# Patient Record
Sex: Female | Born: 1965 | Race: White | Hispanic: No | Smoking: Never smoker
Health system: Southern US, Community
[De-identification: ages and names within clinical notes are randomized; demographics above are authoritative.]

## PROBLEM LIST (undated history)

## (undated) DIAGNOSIS — Z0000 Encounter for general adult medical examination without abnormal findings: Secondary | ICD-10-CM

## (undated) DIAGNOSIS — R51 Headache: Secondary | ICD-10-CM

## (undated) DIAGNOSIS — R32 Unspecified urinary incontinence: Secondary | ICD-10-CM

## (undated) DIAGNOSIS — K219 Gastro-esophageal reflux disease without esophagitis: Secondary | ICD-10-CM

## (undated) DIAGNOSIS — R011 Cardiac murmur, unspecified: Secondary | ICD-10-CM

## (undated) DIAGNOSIS — E785 Hyperlipidemia, unspecified: Secondary | ICD-10-CM

## (undated) DIAGNOSIS — N920 Excessive and frequent menstruation with regular cycle: Secondary | ICD-10-CM

## (undated) DIAGNOSIS — F32A Depression, unspecified: Secondary | ICD-10-CM

## (undated) DIAGNOSIS — Q613 Polycystic kidney, unspecified: Secondary | ICD-10-CM

## (undated) DIAGNOSIS — I82491 Acute embolism and thrombosis of other specified deep vein of right lower extremity: Secondary | ICD-10-CM

## (undated) DIAGNOSIS — F418 Other specified anxiety disorders: Secondary | ICD-10-CM

## (undated) DIAGNOSIS — Q6102 Congenital multiple renal cysts: Secondary | ICD-10-CM

## (undated) DIAGNOSIS — I10 Essential (primary) hypertension: Secondary | ICD-10-CM

## (undated) DIAGNOSIS — Z8619 Personal history of other infectious and parasitic diseases: Secondary | ICD-10-CM

## (undated) DIAGNOSIS — E669 Obesity, unspecified: Secondary | ICD-10-CM

## (undated) HISTORY — DX: Congenital multiple renal cysts: Q61.02

## (undated) HISTORY — DX: Headache: R51

## (undated) HISTORY — DX: Gastro-esophageal reflux disease without esophagitis: K21.9

## (undated) HISTORY — PX: TONSILLECTOMY: SUR1361

## (undated) HISTORY — DX: Acute embolism and thrombosis of other specified deep vein of right lower extremity: I82.491

## (undated) HISTORY — DX: Encounter for general adult medical examination without abnormal findings: Z00.00

## (undated) HISTORY — DX: Polycystic kidney, unspecified: Q61.3

## (undated) HISTORY — DX: Other specified anxiety disorders: F41.8

## (undated) HISTORY — DX: Excessive and frequent menstruation with regular cycle: N92.0

## (undated) HISTORY — DX: Cardiac murmur, unspecified: R01.1

## (undated) HISTORY — DX: Unspecified urinary incontinence: R32

## (undated) HISTORY — DX: Obesity, unspecified: E66.9

## (undated) HISTORY — DX: Personal history of other infectious and parasitic diseases: Z86.19

## (undated) HISTORY — DX: Essential (primary) hypertension: I10

## (undated) HISTORY — DX: Depression, unspecified: F32.A

## (undated) HISTORY — DX: Hyperlipidemia, unspecified: E78.5

---
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
## 2000-06-21 HISTORY — PX: KIDNEY CYST REMOVAL: SHX684

## 2000-06-21 HISTORY — PX: URETERAL STENT PLACEMENT: SHX822

## 2008-06-21 HISTORY — PX: ENDOMETRIAL BIOPSY: SHX622

## 2008-10-30 ENCOUNTER — Ambulatory Visit (HOSPITAL_BASED_OUTPATIENT_CLINIC_OR_DEPARTMENT_OTHER): Admission: RE | Admit: 2008-10-30 | Discharge: 2008-10-30 | Payer: Self-pay | Admitting: Internal Medicine

## 2008-10-30 ENCOUNTER — Ambulatory Visit: Payer: Self-pay | Admitting: Diagnostic Radiology

## 2010-07-06 ENCOUNTER — Ambulatory Visit
Admission: RE | Admit: 2010-07-06 | Discharge: 2010-07-06 | Payer: Self-pay | Source: Home / Self Care | Attending: Internal Medicine | Admitting: Internal Medicine

## 2010-09-10 ENCOUNTER — Other Ambulatory Visit: Payer: Self-pay

## 2010-09-10 ENCOUNTER — Ambulatory Visit (INDEPENDENT_AMBULATORY_CARE_PROVIDER_SITE_OTHER): Payer: BC Managed Care – PPO | Admitting: Internal Medicine

## 2010-09-10 DIAGNOSIS — L851 Acquired keratosis [keratoderma] palmaris et plantaris: Secondary | ICD-10-CM

## 2010-09-10 DIAGNOSIS — Z01419 Encounter for gynecological examination (general) (routine) without abnormal findings: Secondary | ICD-10-CM

## 2010-12-03 ENCOUNTER — Ambulatory Visit (INDEPENDENT_AMBULATORY_CARE_PROVIDER_SITE_OTHER): Payer: BC Managed Care – PPO | Admitting: Internal Medicine

## 2010-12-03 DIAGNOSIS — I10 Essential (primary) hypertension: Secondary | ICD-10-CM

## 2010-12-03 DIAGNOSIS — G43109 Migraine with aura, not intractable, without status migrainosus: Secondary | ICD-10-CM

## 2010-12-03 DIAGNOSIS — M79609 Pain in unspecified limb: Secondary | ICD-10-CM

## 2010-12-09 ENCOUNTER — Encounter: Payer: Self-pay | Admitting: Internal Medicine

## 2010-12-10 ENCOUNTER — Ambulatory Visit: Payer: Self-pay | Admitting: Internal Medicine

## 2011-03-18 ENCOUNTER — Ambulatory Visit (INDEPENDENT_AMBULATORY_CARE_PROVIDER_SITE_OTHER): Payer: BC Managed Care – PPO | Admitting: Emergency Medicine

## 2011-03-18 DIAGNOSIS — Z23 Encounter for immunization: Secondary | ICD-10-CM

## 2011-03-18 NOTE — Progress Notes (Signed)
  Subjective:    Patient ID: Kim Fox, female    DOB: 1965-07-26, 45 y.o.   MRN: [id number]  HPI    Review of Systems     Objective:   Physical Exam        Assessment & Plan:

## 2011-04-07 ENCOUNTER — Telehealth: Payer: Self-pay | Admitting: Emergency Medicine

## 2011-04-07 NOTE — Telephone Encounter (Signed)
Kim Fox came in with dtr this afternoon.  She has cold sore coming up on lower lip.  She would like medication called into pharmacy if possible.  Aware I would speak to DDS and send in if appropriate

## 2011-04-07 NOTE — Telephone Encounter (Signed)
LMOVM for Kim Fox with DDS recommendations

## 2011-04-07 NOTE — Telephone Encounter (Signed)
Ok to get OTC Abreva for cold sore

## 2011-05-26 ENCOUNTER — Other Ambulatory Visit: Payer: Self-pay | Admitting: Emergency Medicine

## 2011-05-26 DIAGNOSIS — IMO0001 Reserved for inherently not codable concepts without codable children: Secondary | ICD-10-CM

## 2011-05-26 NOTE — Telephone Encounter (Signed)
Refill request for pt's generic Cryselle

## 2011-05-27 MED ORDER — NORGESTREL-ETHINYL ESTRADIOL 0.3-30 MG-MCG PO TABS: 1.0000 | ORAL_TABLET | Freq: Every day | ORAL | Status: DC

## 2011-06-10 ENCOUNTER — Telehealth: Payer: Self-pay | Admitting: Emergency Medicine

## 2011-06-10 NOTE — Telephone Encounter (Signed)
Spoke with Kim Fox.  She states she feels like she has a sinus infection, would like medication called in before holiday.  She states she has significant sinus pressure and pain, her teeth hurt, headache.  She denies any fever.  She is not able to loosen much of the congestion to blow any out of her nose.  Advised I can not call in medication without being seen and DDS is out of the office until next week.  I advised her to go to urgent care today for potential abx therapy.  She is agreeable- will go to urgent care near her home today

## 2011-07-26 ENCOUNTER — Ambulatory Visit (INDEPENDENT_AMBULATORY_CARE_PROVIDER_SITE_OTHER): Payer: BC Managed Care – PPO | Admitting: Internal Medicine

## 2011-07-26 ENCOUNTER — Encounter: Payer: Self-pay | Admitting: Internal Medicine

## 2011-07-26 VITALS — BP 151/90 | HR 70 | Temp 97.1°F | Resp 12 | Ht 66.5 in | Wt 206.0 lb

## 2011-07-26 DIAGNOSIS — J329 Chronic sinusitis, unspecified: Secondary | ICD-10-CM

## 2011-07-26 MED ORDER — AZITHROMYCIN 250 MG PO TABS: ORAL_TABLET | ORAL | Status: AC

## 2011-07-26 NOTE — Patient Instructions (Signed)
Take meds as presribed call if not better

## 2011-07-26 NOTE — Progress Notes (Signed)
Subjective:    Patient ID: Kim Fox, female    DOB: Feb 18, 1966, 46 y.o.   MRN: [id number]  HPI  Here for acute visit.  Sinus pain congestion both cheeks one week.  No fever she had influenza vaccine  Allergies  Allergen Reactions  . Macrodantin Rash  . Penicillins Rash   Past Medical History  Diagnosis Date  . Hypertension   . Headache     migraine  . GERD (gastroesophageal reflux disease)   . Polycystic kidney disease   . Obesity   . Menorrhagia   . Murmur, cardiac     Echo WNL 2009   Past Surgical History  Procedure Date  . Tonsillectomy   . Ureteral stent placement 2002  . Kidney cyst removal 2002  . Endometrial biopsy 2010   History   Social History  . Marital Status: Divorced    Spouse Name: N/A    Number of Children: 2  . 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 Smoking status: Never Smoker   . Smokeless tobacco: Not on file  . Alcohol Use: 0.5 oz/week    1 drink(s) per week  . Drug Use: No  . Sexually Active: Not Currently   Other Topics Concern  . Not on file   Social History Narrative  . No narrative on file   Family History  Problem Relation Age of Onset  . Diabetes Mother   . Hypertension Mother   . Diabetes Father   . Hypertension Father   . Migraines Mother   . Migraines Sister   . Cancer Paternal Grandmother     lung  . Cancer Paternal Grandfather     bladder, prostate  . Heart disease Paternal Grandfather    There is no problem list on file for this patient.  Current Outpatient Prescriptions on File Prior to Visit  Medication Sig Dispense Refill  . norgestrel-ethinyl estradiol (LO/OVRAL,CRYSELLE) 0.3-30 MG-MCG tablet Take 1 tablet by mouth daily.  1 Package  11  . SUMAtriptan (IMITREX) 100 MG tablet Take 100 mg by mouth. Take at the onset of HA.  May repeat in 2 hours if needed.  Max dose 2/24 hours, 4/week           Review of  Systems See HPI    Objective:   Physical Exam Physical Exam  Constitutional: She is oriented to person, place, and time. She appears well-developed and well-nourished. She is cooperative.  HENT:  Head: Normocephalic and atraumatic.  Right Ear: A middle ear effusion is present.  Left Ear: A middle ear effusion is present.  Nose: Mucosal edema present. Right sinus exhibits maxillary sinus tenderness. Left sinus exhibits maxillary sinus tenderness.  Mouth/Throat: Posterior oropharyngeal erythema present.  Serous effusion bilaterally  Eyes: Conjunctivae and EOM are normal. Pupils are equal, round, and reactive to light.  Neck: Neck supple. Carotid bruit is not present. No mass present.  Cardiovascular: Regular rhythm, normal heart sounds, intact distal pulses and normal pulses. Exam reveals no gallop and no friction rub.  No murmur heard.  Pulmonary/Chest: Breath sounds normal. She has no wheezes. She has no rhonchi. She has no rales.  Neurological: She is alert and oriented to person, place, and time.  Skin: Skin is warm and dry. No abrasion, no bruising, no ecchymosis and no rash noted. No cyanosis. Nails show no clubbing.  Psychiatric: She has a normal mood and affect. Her speech  is normal and behavior is normal.            Assessment & Plan:  1)  Sinusitis  Z pak and pt states she has OTC /sudafed 2)  Cough  OTC cough med of choice  Call if not better

## 2011-08-09 ENCOUNTER — Ambulatory Visit (INDEPENDENT_AMBULATORY_CARE_PROVIDER_SITE_OTHER): Payer: BC Managed Care – PPO | Admitting: Internal Medicine

## 2011-08-09 ENCOUNTER — Encounter: Payer: Self-pay | Admitting: Internal Medicine

## 2011-08-09 DIAGNOSIS — Z23 Encounter for immunization: Secondary | ICD-10-CM

## 2011-08-09 DIAGNOSIS — E669 Obesity, unspecified: Secondary | ICD-10-CM

## 2011-08-09 DIAGNOSIS — I10 Essential (primary) hypertension: Secondary | ICD-10-CM

## 2011-08-09 DIAGNOSIS — Q6102 Congenital multiple renal cysts: Secondary | ICD-10-CM | POA: Insufficient documentation

## 2011-08-09 DIAGNOSIS — Q613 Polycystic kidney, unspecified: Secondary | ICD-10-CM

## 2011-08-09 DIAGNOSIS — R928 Other abnormal and inconclusive findings on diagnostic imaging of breast: Secondary | ICD-10-CM | POA: Insufficient documentation

## 2011-08-09 DIAGNOSIS — G43909 Migraine, unspecified, not intractable, without status migrainosus: Secondary | ICD-10-CM

## 2011-08-09 DIAGNOSIS — Z8679 Personal history of other diseases of the circulatory system: Secondary | ICD-10-CM

## 2011-08-09 DIAGNOSIS — Z0000 Encounter for general adult medical examination without abnormal findings: Secondary | ICD-10-CM

## 2011-08-09 DIAGNOSIS — K219 Gastro-esophageal reflux disease without esophagitis: Secondary | ICD-10-CM

## 2011-08-09 HISTORY — DX: Congenital multiple renal cysts: Q61.02

## 2011-08-09 LAB — POCT URINALYSIS DIPSTICK
Bilirubin, UA: NEGATIVE
Blood, UA: NEGATIVE
Glucose, UA: NEGATIVE
Nitrite, UA: NEGATIVE
Spec Grav, UA: 1.02

## 2011-08-09 MED ORDER — ATENOLOL 50 MG PO TABS: 50.0000 mg | ORAL_TABLET | Freq: Every day | ORAL | Status: DC

## 2011-08-09 NOTE — Patient Instructions (Signed)
Take BP med daily  See me in 2 months

## 2011-08-09 NOTE — Progress Notes (Signed)
Subjective:    Patient ID: Kim Fox, female    DOB: 1965/12/30, 46 y.o.   MRN: [id number]  HPI  Kim Fox is here for comprehensive eval.  She is doing well  SEe BP.  We had stopped her Atenolol as she was losing weight .  Strong GH of htn in both parents.  She sometimes feels a "pulsation feeling"  No headache no chest pain no sob no LE edema  She continues to do well with weight watchers  She had tetanus in 2002  Allergies  Allergen Reactions  . Macrodantin Rash  . Penicillins Rash   Past Medical History  Diagnosis Date  . Hypertension   . Headache     migraine  . GERD (gastroesophageal reflux disease)   . Polycystic kidney disease   . Obesity   . Menorrhagia   . Murmur, cardiac     Echo WNL 2009   Past Surgical History  Procedure Date  . Tonsillectomy   . Ureteral stent placement 2002  . Kidney cyst removal 2002  . Endometrial biopsy 2010   History   Social History  . Marital Status: Divorced    Spouse Name: N/A    Number of Children: 2  . 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 Smoking status: Never Smoker   . Smokeless tobacco: Not on file  . Alcohol Use: 0.5 oz/week    1 drink(s) per week  . Drug Use: No  . Sexually Active: Not Currently   Other Topics Concern  . Not on file   Social History Narrative  . No narrative on file   Family History  Problem Relation Age of Onset  . Diabetes Mother   . Hypertension Mother   . Diabetes Father   . Hypertension Father   . Migraines Mother   . Migraines Sister   . Cancer Paternal Grandmother     lung  . Cancer Paternal Grandfather     bladder, prostate  . Heart disease Paternal Grandfather    Patient Active Problem List  Diagnoses  . Essential hypertension, benign  . GERD (gastroesophageal reflux disease)  . Migraine  . Polycystic kidney disease  . Obesity  . H/O cardiac murmur  . Abnormal mammogram     Current Outpatient Prescriptions on File Prior to Visit  Medication Sig Dispense Refill  . norgestrel-ethinyl estradiol (LO/OVRAL,CRYSELLE) 0.3-30 MG-MCG tablet Take 1 tablet by mouth daily.  1 Package  11  . SUMAtriptan (IMITREX) 100 MG tablet Take 100 mg by mouth. Take at the onset of HA.  May repeat in 2 hours if needed.  Max dose 2/24 hours, 4/week             Review of Systems  Constitutional: Negative for fever, fatigue and unexpected weight change.  HENT: Negative for hearing loss, ear pain, nosebleeds and neck pain.   Eyes: Negative for pain, redness and visual disturbance.  Respiratory: Negative for cough, chest tightness, shortness of breath and wheezing.   Cardiovascular: Negative for chest pain, palpitations and leg swelling.  Gastrointestinal: Negative for abdominal pain and blood in stool.  Genitourinary: Negative for dysuria, difficulty urinating and pelvic pain.  Musculoskeletal: Negative for myalgias and joint swelling.  Neurological: Negative for tremors and syncope.       Objective:   Physical Exam Physical Exam  Nursing note and vitals reviewed.  Constitutional: She is oriented to person, place, and  time. She appears well-developed and well-nourished.  HENT:  Head: Normocephalic and atraumatic.  Right Ear: Tympanic membrane and ear canal normal. No drainage. Tympanic membrane is not injected and not erythematous.  Left Ear: Tympanic membrane and ear canal normal. No drainage. Tympanic membrane is not injected and not erythematous.  Nose: Nose normal. Right sinus exhibits no maxillary sinus tenderness and no frontal sinus tenderness. Left sinus exhibits no maxillary sinus tenderness and no frontal sinus tenderness.  Mouth/Throat: Oropharynx is clear and moist. No oral lesions. No oropharyngeal exudate.  Eyes: Conjunctivae and EOM are normal. Pupils are equal, round, and reactive to light.  Neck: Normal range of motion. Neck supple. No JVD present. Carotid  bruit is not present. No mass and no thyromegaly present.  Cardiovascular: Normal rate, regular rhythm, S1 normal, S2 normal and intact distal pulses. Exam reveals no gallop and no friction rub.  No murmur heard.  Pulses:  Carotid pulses are 2+ on the right side, and 2+ on the left side.  Dorsalis pedis pulses are 2+ on the right side, and 2+ on the left side.  No carotid bruit. No LE edema  Pulmonary/Chest: Breath sounds normal. She has no wheezes. She has no rales. She exhibits no tenderness.  Breasts:  No discrete mass no nipple discharge no axiallary adenopathy bilaterally  Abdominal: Soft. Bowel sounds are normal. She exhibits no distension and no mass. There is no hepatosplenomegaly. There is no tenderness. There is no CVA tenderness.  Musculoskeletal: Normal range of motion.  No active synovitis to joints.  Lymphadenopathy:  She has no cervical adenopathy.  She has no axillary adenopathy.  Right: No inguinal and no supraclavicular adenopathy present.  Left: No inguinal and no supraclavicular adenopathy present.  Neurological: She is alert and oriented to person, place, and time. She has normal strength and normal reflexes. She displays no tremor. No cranial nerve deficit or sensory deficit. Coordination and gait normal.  Skin: Skin is warm and dry. No rash noted. No cyanosis. Nails show no clubbing.  Psychiatric: She has a normal mood and affect. Her speech is normal and behavior is normal. Cognition and memory are normal.       Assessment & Plan:  1)  Health Maintenance: See scanned sheet Tdap today. Counseled to get mmammmogram in April 2)  HTN  Will need to re-intitate Atenolol   Return 2 months 3)  Obesity  Doing very well with weight watchers  4)  Migraine   5)  GERD  inmproving with weight loss 6) Polycystic kidney disease  Return 2 months

## 2011-08-10 LAB — COMPREHENSIVE METABOLIC PANEL
ALT: 14 U/L (ref 0–35)
BUN: 20 mg/dL (ref 6–23)
CO2: 26 mEq/L (ref 19–32)
Calcium: 9 mg/dL (ref 8.4–10.5)
Creat: 0.68 mg/dL (ref 0.50–1.10)
Total Bilirubin: 0.4 mg/dL (ref 0.3–1.2)

## 2011-08-10 LAB — CBC WITH DIFFERENTIAL/PLATELET
Eosinophils Absolute: 0.1 10*3/uL (ref 0.0–0.7)
Eosinophils Relative: 1 % (ref 0–5)
HCT: 44.7 % (ref 36.0–46.0)
Hemoglobin: 14.6 g/dL (ref 12.0–15.0)
Lymphocytes Relative: 34 % (ref 12–46)
Lymphs Abs: 2.6 10*3/uL (ref 0.7–4.0)
MCH: 29.6 pg (ref 26.0–34.0)
MCV: 90.5 fL (ref 78.0–100.0)
Monocytes Relative: 7 % (ref 3–12)
RBC: 4.94 MIL/uL (ref 3.87–5.11)

## 2011-08-10 LAB — LIPID PANEL
Cholesterol: 149 mg/dL (ref 0–200)
HDL: 47 mg/dL (ref >39)
LDL Cholesterol: 85 mg/dL (ref 0–99)
Triglycerides: 84 mg/dL (ref <150)

## 2011-08-10 LAB — TSH: TSH: 1.577 u[IU]/mL (ref 0.350–4.500)

## 2011-08-12 ENCOUNTER — Telehealth: Payer: Self-pay | Admitting: Emergency Medicine

## 2011-08-12 NOTE — Telephone Encounter (Signed)
Lab results mailed to Cathy's home address with notes from DDS

## 2011-09-13 ENCOUNTER — Encounter: Payer: BC Managed Care – PPO | Admitting: Internal Medicine

## 2011-10-04 ENCOUNTER — Other Ambulatory Visit: Payer: Self-pay | Admitting: Internal Medicine

## 2011-10-04 DIAGNOSIS — Z1231 Encounter for screening mammogram for malignant neoplasm of breast: Secondary | ICD-10-CM

## 2011-10-06 ENCOUNTER — Ambulatory Visit (HOSPITAL_BASED_OUTPATIENT_CLINIC_OR_DEPARTMENT_OTHER)
Admission: RE | Admit: 2011-10-06 | Discharge: 2011-10-06 | Disposition: A | Discharge status: Home / Self Care | Payer: BC Managed Care – PPO | Source: Ambulatory Visit | Attending: Internal Medicine | Admitting: Internal Medicine

## 2011-10-06 DIAGNOSIS — Z1231 Encounter for screening mammogram for malignant neoplasm of breast: Secondary | ICD-10-CM | POA: Insufficient documentation

## 2011-11-03 ENCOUNTER — Ambulatory Visit (INDEPENDENT_AMBULATORY_CARE_PROVIDER_SITE_OTHER): Payer: BC Managed Care – PPO | Admitting: Internal Medicine

## 2011-11-03 ENCOUNTER — Encounter: Payer: Self-pay | Admitting: Internal Medicine

## 2011-11-03 VITALS — BP 120/70 | HR 52 | Temp 96.8°F | Resp 16 | Ht 68.0 in | Wt 209.0 lb

## 2011-11-03 DIAGNOSIS — E669 Obesity, unspecified: Secondary | ICD-10-CM

## 2011-11-03 DIAGNOSIS — I10 Essential (primary) hypertension: Secondary | ICD-10-CM

## 2011-11-03 DIAGNOSIS — K219 Gastro-esophageal reflux disease without esophagitis: Secondary | ICD-10-CM

## 2011-11-03 NOTE — Progress Notes (Signed)
Subjective:    Patient ID: Kim Fox, female    DOB: June 16, 1966, 46 y.o.   MRN: [id number]  HPI Kim Fox is here after re-initiating low dose beta blocker for BP.  Feeling well no side effects from med.  No chest pain no SOB, no LE edema   Allergies  Allergen Reactions  . Macrodantin Rash  . Penicillins Rash   Past Medical History  Diagnosis Date  . Hypertension   . Headache     migraine  . GERD (gastroesophageal reflux disease)   . Polycystic kidney disease   . Obesity   . Menorrhagia   . Murmur, cardiac     Echo WNL 2009   Past Surgical History  Procedure Date  . Tonsillectomy   . Ureteral stent placement 2002  . Kidney cyst removal 2002  . Endometrial biopsy 2010   History   Social History  . Marital Status: Divorced    Spouse Name: N/A    Number of Children: 2  . 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 Smoking status: Never Smoker   . Smokeless tobacco: Not on file  . Alcohol Use: 0.5 oz/week    1 drink(s) per week  . Drug Use: No  . Sexually Active: Not Currently   Other Topics Concern  . Not on file   Social History Narrative  . No narrative on file   Family History  Problem Relation Age of Onset  . Diabetes Mother   . Hypertension Mother   . Diabetes Father   . Hypertension Father   . Migraines Mother   . Migraines Sister   . Cancer Paternal Grandmother     lung  . Cancer Paternal Grandfather     bladder, prostate  . Heart disease Paternal Grandfather    Patient Active Problem List  Diagnoses  . Essential hypertension, benign  . GERD (gastroesophageal reflux disease)  . Migraine  . Polycystic kidney disease  . Obesity  . H/O cardiac murmur  . Abnormal mammogram   Current Outpatient Prescriptions on File Prior to Visit  Medication Sig Dispense Refill  . atenolol (TENORMIN) 50 MG tablet Take 1 tablet (50 mg total) by mouth daily.  90  tablet  3  . Biotin 5000 MCG CAPS Take 1 capsule by mouth daily.      . Calcium Carbonate-Vitamin D (CALCIUM-VITAMIN D) 600-200 MG-UNIT CAPS Take 1 tablet by mouth 2 (two) times daily.      . Multiple Vitamin (MULTIVITAMIN) tablet Take 1 tablet by mouth daily.      . norgestrel-ethinyl estradiol (LO/OVRAL,CRYSELLE) 0.3-30 MG-MCG tablet Take 1 tablet by mouth daily.  1 Package  11  . SUMAtriptan (IMITREX) 100 MG tablet Take 100 mg by mouth. Take at the onset of HA.  May repeat in 2 hours if needed.  Max dose 2/24 hours, 4/week       . L-Lysine 1000 MG TABS Take 1 tablet by mouth daily.          Review of Systems    see HPI Objective:   Physical Exam  Physical Exam  Nursing note and vitals reviewed.  Constitutional: She is oriented to person, place, and time. She appears well-developed and well-nourished.  HENT:  Head: Normocephalic and atraumatic.  Cardiovascular: Normal rate and regular rhythm. Exam reveals no gallop and no friction rub.  No murmur heard.  Pulmonary/Chest: Breath sounds normal. She has no  wheezes. She has no rales.  Neurological: She is alert and oriented to person, place, and time.  Skin: Skin is warm and dry.  Psychiatric: She has a normal mood and affect. Her behavior is normal.  Ext no edema        Assessment & Plan:  1)  HTN  Well controlled continue atenolol 2)  Obesity  On weight watchers 3) GERD  Well controlled

## 2012-04-17 ENCOUNTER — Other Ambulatory Visit: Payer: Self-pay | Admitting: *Deleted

## 2012-04-17 DIAGNOSIS — IMO0001 Reserved for inherently not codable concepts without codable children: Secondary | ICD-10-CM

## 2012-04-17 MED ORDER — NORGESTREL-ETHINYL ESTRADIOL 0.3-30 MG-MCG PO TABS: 1.0000 | ORAL_TABLET | Freq: Every day | ORAL | Status: DC

## 2012-04-17 NOTE — Telephone Encounter (Signed)
Needs refill

## 2012-06-23 ENCOUNTER — Other Ambulatory Visit: Payer: Self-pay | Admitting: *Deleted

## 2012-06-23 DIAGNOSIS — I10 Essential (primary) hypertension: Secondary | ICD-10-CM

## 2012-06-23 MED ORDER — ATENOLOL 50 MG PO TABS: 50.0000 mg | ORAL_TABLET | Freq: Every day | ORAL | Status: DC

## 2012-06-23 NOTE — Telephone Encounter (Signed)
Refill request

## 2012-09-06 ENCOUNTER — Ambulatory Visit (INDEPENDENT_AMBULATORY_CARE_PROVIDER_SITE_OTHER): Payer: BC Managed Care – PPO | Admitting: Internal Medicine

## 2012-09-06 ENCOUNTER — Other Ambulatory Visit: Payer: Self-pay | Admitting: Internal Medicine

## 2012-09-06 ENCOUNTER — Encounter: Payer: Self-pay | Admitting: Internal Medicine

## 2012-09-06 VITALS — BP 114/68 | HR 59 | Resp 18 | Ht 68.0 in | Wt 214.0 lb

## 2012-09-06 DIAGNOSIS — Z139 Encounter for screening, unspecified: Secondary | ICD-10-CM

## 2012-09-06 DIAGNOSIS — Z8679 Personal history of other diseases of the circulatory system: Secondary | ICD-10-CM

## 2012-09-06 DIAGNOSIS — G43909 Migraine, unspecified, not intractable, without status migrainosus: Secondary | ICD-10-CM

## 2012-09-06 DIAGNOSIS — S6991XS Unspecified injury of right wrist, hand and finger(s), sequela: Secondary | ICD-10-CM

## 2012-09-06 DIAGNOSIS — R319 Hematuria, unspecified: Secondary | ICD-10-CM

## 2012-09-06 DIAGNOSIS — Q613 Polycystic kidney, unspecified: Secondary | ICD-10-CM

## 2012-09-06 DIAGNOSIS — Z1231 Encounter for screening mammogram for malignant neoplasm of breast: Secondary | ICD-10-CM

## 2012-09-06 DIAGNOSIS — I10 Essential (primary) hypertension: Secondary | ICD-10-CM

## 2012-09-06 DIAGNOSIS — Z0000 Encounter for general adult medical examination without abnormal findings: Secondary | ICD-10-CM

## 2012-09-06 DIAGNOSIS — K219 Gastro-esophageal reflux disease without esophagitis: Secondary | ICD-10-CM

## 2012-09-06 LAB — POCT URINALYSIS DIPSTICK
Bilirubin, UA: NEGATIVE
Glucose, UA: NEGATIVE
Ketones, UA: NEGATIVE
Nitrite: NEGATIVE
pH, UA: 5.5

## 2012-09-06 LAB — COMPREHENSIVE METABOLIC PANEL
ALT: 15 U/L (ref 0–35)
AST: 18 U/L (ref 0–37)
Albumin: 3.5 g/dL (ref 3.5–5.2)
CO2: 28 mEq/L (ref 19–32)
Calcium: 8.7 mg/dL (ref 8.4–10.5)
Chloride: 105 mEq/L (ref 96–112)
Potassium: 4.7 mEq/L (ref 3.5–5.3)
Sodium: 139 mEq/L (ref 135–145)
Total Protein: 6.7 g/dL (ref 6.0–8.3)

## 2012-09-06 LAB — CBC WITH DIFFERENTIAL/PLATELET
Basophils Absolute: 0 10*3/uL (ref 0.0–0.1)
Basophils Relative: 1 % (ref 0–1)
HCT: 44.1 % (ref 36.0–46.0)
Lymphocytes Relative: 33 % (ref 12–46)
MCHC: 34 g/dL (ref 30.0–36.0)
Monocytes Absolute: 0.7 10*3/uL (ref 0.1–1.0)
Neutro Abs: 3.4 10*3/uL (ref 1.7–7.7)
Platelets: 250 10*3/uL (ref 150–400)
RDW: 12.9 % (ref 11.5–15.5)
WBC: 6.2 10*3/uL (ref 4.0–10.5)

## 2012-09-06 LAB — LIPID PANEL
Cholesterol: 135 mg/dL (ref 0–200)
Total CHOL/HDL Ratio: 3.6 Ratio
VLDL: 20 mg/dL (ref 0–40)

## 2012-09-06 MED ORDER — ATENOLOL 50 MG PO TABS: 50.0000 mg | ORAL_TABLET | Freq: Every day | ORAL | Status: DC

## 2012-09-06 MED ORDER — CIPROFLOXACIN HCL 500 MG PO TABS: 500.0000 mg | ORAL_TABLET | Freq: Two times a day (BID) | ORAL | Status: DC

## 2012-09-06 NOTE — Progress Notes (Signed)
Subjective:    Patient ID: Kim Fox, female    DOB: 11-08-1965, 47 y.o.   MRN: [id number]  HPI Lynden Ang is here for CPE  Larey Seat at urgent care  04/12/2013  Syncope after steroid shot.  Still has residual localized scalp pin  06/2012 fell on R wrist  Had hematoma and still has some swelling  Some urinary urgency   Allergies  Allergen Reactions  . Macrodantin Rash  . Penicillins Rash   Past Medical History  Diagnosis Date  . Hypertension   . Headache     migraine  . GERD (gastroesophageal reflux disease)   . Polycystic kidney disease   . Obesity   . Menorrhagia   . Murmur, cardiac     Echo WNL 2009   Past Surgical History  Procedure Laterality Date  . Tonsillectomy    . Ureteral stent placement  2002  . Kidney cyst removal  2002  . Endometrial biopsy  2010   History   Social History  . Marital Status: Divorced    Spouse Name: N/A    Number of Children: 2  . 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 Smoking status: Never Smoker   . Smokeless tobacco: Not on file  . Alcohol Use: 0.5 oz/week    1 drink(s) per week  . Drug Use: No  . Sexually Active: Not Currently   Other Topics Concern  . Not on file   Social History Narrative  . No narrative on file   Family History  Problem Relation Age of Onset  . Diabetes Mother   . Hypertension Mother   . Diabetes Father   . Hypertension Father   . Migraines Mother   . Migraines Sister   . Cancer Paternal Grandmother     lung  . Cancer Paternal Grandfather     bladder, prostate  . Heart disease Paternal Grandfather    Patient Active Problem List  Diagnosis  . Essential hypertension, benign  . GERD (gastroesophageal reflux disease)  . Migraine  . Polycystic kidney disease  . Obesity  . H/O cardiac murmur  . Abnormal mammogram   Current Outpatient Prescriptions on File Prior to Visit  Medication Sig Dispense  Refill  . atenolol (TENORMIN) 50 MG tablet Take 1 tablet (50 mg total) by mouth daily.  90 tablet  3  . Biotin 5000 MCG CAPS Take 1 capsule by mouth daily.      . Calcium Carbonate-Vitamin D (CALCIUM-VITAMIN D) 600-200 MG-UNIT CAPS Take 1 tablet by mouth 2 (two) times daily.      Marland Kitchen L-Lysine 1000 MG TABS Take 1 tablet by mouth daily.      . Multiple Vitamin (MULTIVITAMIN) tablet Take 1 tablet by mouth daily.      . norgestrel-ethinyl estradiol (LO/OVRAL,CRYSELLE) 0.3-30 MG-MCG tablet Take 1 tablet by mouth daily.  1 Package  11  . SUMAtriptan (IMITREX) 100 MG tablet Take 100 mg by mouth. Take at the onset of HA.  May repeat in 2 hours if needed.  Max dose 2/24 hours, 4/week        No current facility-administered medications on file prior to visit.      Review of Systems  All other systems reviewed and are negative.      Objective:   Physical Exam Physical Exam  Nursing note and vitals reviewed.  Constitutional: She is oriented to person, place, and time. She appears  well-developed and well-nourished.  HENT:  Head: Normocephalic and atraumatic.  Right Ear: Tympanic membrane and ear canal normal. No drainage. Tympanic membrane is not injected and not erythematous.  Left Ear: Tympanic membrane and ear canal normal. No drainage. Tympanic membrane is not injected and not erythematous.  Nose: Nose normal. Right sinus exhibits no maxillary sinus tenderness and no frontal sinus tenderness. Left sinus exhibits no maxillary sinus tenderness and no frontal sinus tenderness.  Mouth/Throat: Oropharynx is clear and moist. No oral lesions. No oropharyngeal exudate.  Eyes: Conjunctivae and EOM are normal. Pupils are equal, round, and reactive to light.  Neck: Normal range of motion. Neck supple. No JVD present. Carotid bruit is not present. No mass and no thyromegaly present.  Cardiovascular: Normal rate, regular rhythm, S1 normal, S2 normal and intact distal pulses. Exam reveals no gallop and no  friction rub.  No murmur heard.  Pulses:  Carotid pulses are 2+ on the right side, and 2+ on the left side.  Dorsalis pedis pulses are 2+ on the right side, and 2+ on the left side.  No carotid bruit. No LE edema  Pulmonary/Chest: Breath sounds normal. She has no wheezes. She has no rales. She exhibits no tenderness. Breast no discrete mass no nipple disharge no axillary adenopathy bilaterally Abdominal: Soft. Bowel sounds are normal. She exhibits no distension and no mass. There is no hepatosplenomegaly. There is no tenderness. There is no CVA tenderness.  Musculoskeletal: Normal range of motion.  No active synovitis to joints.  Lymphadenopathy:  She has no cervical adenopathy.  She has no axillary adenopathy.  Right: No inguinal and no supraclavicular adenopathy present.  Left: No inguinal and no supraclavicular adenopathy present.  Neurological: She is alert and oriented to person, place, and time. She has normal strength and normal reflexes. She displays no tremor. No cranial nerve deficit or sensory deficit. Coordination and gait normal.  Skin: Skin is warm and dry. No rash noted. No cyanosis. Nails show no clubbing.  Scalp  No swelling no area of hematoma Psychiatric: She has a normal mood and affect. Her speech is normal and behavior is normal. Cognition and memory are normal.  R wrist she has localized edema of wrist no bruising          Assessment & Plan:  Health Maintenance  Due for mm in April Pap due 2015  R wrist swelling  Will get Xray today  UTI  cipro for 5 days  HTn continue current meds  Migraine   Continue imitrex as needed  History of polycystic kidney disease

## 2012-09-06 NOTE — Patient Instructions (Addendum)
See me as needed 

## 2012-09-07 ENCOUNTER — Encounter: Payer: Self-pay | Admitting: *Deleted

## 2012-09-07 LAB — VITAMIN D 25 HYDROXY (VIT D DEFICIENCY, FRACTURES): Vit D, 25-Hydroxy: 52 ng/mL (ref 30–89)

## 2012-09-08 LAB — URINE CULTURE: Colony Count: 35000

## 2012-09-09 ENCOUNTER — Ambulatory Visit (HOSPITAL_BASED_OUTPATIENT_CLINIC_OR_DEPARTMENT_OTHER)
Admission: RE | Admit: 2012-09-09 | Discharge: 2012-09-09 | Disposition: A | Discharge status: Home / Self Care | Payer: BC Managed Care – PPO | Source: Ambulatory Visit | Attending: Internal Medicine | Admitting: Internal Medicine

## 2012-09-09 DIAGNOSIS — S59919A Unspecified injury of unspecified forearm, initial encounter: Secondary | ICD-10-CM | POA: Insufficient documentation

## 2012-09-09 DIAGNOSIS — S59909A Unspecified injury of unspecified elbow, initial encounter: Secondary | ICD-10-CM | POA: Insufficient documentation

## 2012-09-09 DIAGNOSIS — S6991XS Unspecified injury of right wrist, hand and finger(s), sequela: Secondary | ICD-10-CM

## 2012-09-09 DIAGNOSIS — S6990XA Unspecified injury of unspecified wrist, hand and finger(s), initial encounter: Secondary | ICD-10-CM | POA: Insufficient documentation

## 2012-09-09 DIAGNOSIS — W19XXXA Unspecified fall, initial encounter: Secondary | ICD-10-CM | POA: Insufficient documentation

## 2012-09-12 ENCOUNTER — Telehealth: Payer: Self-pay | Admitting: *Deleted

## 2012-09-12 NOTE — Telephone Encounter (Signed)
Notified pt of - wrist x ray LVM

## 2012-09-12 NOTE — Telephone Encounter (Signed)
Message copied by Mathews Robinsons on Tue Sep 12, 2012 10:46 AM ------      Message from: Raechel Chute D      Created: Mon Sep 11, 2012  5:42 PM       Karen Kitchens            Call pt and let her know that her Wrist xray shows nothing worrisome concerning her swelling ------

## 2012-10-10 ENCOUNTER — Inpatient Hospital Stay (HOSPITAL_BASED_OUTPATIENT_CLINIC_OR_DEPARTMENT_OTHER): Admission: RE | Admit: 2012-10-10 | Payer: BC Managed Care – PPO | Source: Ambulatory Visit

## 2012-10-12 ENCOUNTER — Ambulatory Visit (HOSPITAL_BASED_OUTPATIENT_CLINIC_OR_DEPARTMENT_OTHER)
Admission: RE | Admit: 2012-10-12 | Discharge: 2012-10-12 | Disposition: A | Discharge status: Home / Self Care | Payer: BC Managed Care – PPO | Source: Ambulatory Visit | Attending: Internal Medicine | Admitting: Internal Medicine

## 2012-10-12 DIAGNOSIS — Z1231 Encounter for screening mammogram for malignant neoplasm of breast: Secondary | ICD-10-CM | POA: Insufficient documentation

## 2012-10-24 ENCOUNTER — Ambulatory Visit (INDEPENDENT_AMBULATORY_CARE_PROVIDER_SITE_OTHER): Payer: BC Managed Care – PPO | Admitting: Internal Medicine

## 2012-10-24 ENCOUNTER — Encounter: Payer: Self-pay | Admitting: Internal Medicine

## 2012-10-24 VITALS — BP 126/82 | HR 67 | Temp 97.3°F | Resp 18 | Wt 213.0 lb

## 2012-10-24 DIAGNOSIS — J3489 Other specified disorders of nose and nasal sinuses: Secondary | ICD-10-CM

## 2012-10-24 DIAGNOSIS — J329 Chronic sinusitis, unspecified: Secondary | ICD-10-CM

## 2012-10-24 DIAGNOSIS — R0981 Nasal congestion: Secondary | ICD-10-CM

## 2012-10-24 MED ORDER — AZITHROMYCIN 250 MG PO TABS: ORAL_TABLET | ORAL | Status: DC

## 2012-10-24 NOTE — Progress Notes (Signed)
Subjective:    Patient ID: Kim Fox, female    DOB: 1965-12-27, 47 y.o.   MRN: [id number]  HPI Kim Fox is here for acute visit.  Several days of sinus pressure pain,  Stuffy nose.  Hurts when she bites down.  Sore throat   No cough no chest pain no SOB  No documented fever  Allergies  Allergen Reactions  . Macrodantin Rash  . Penicillins Rash   Past Medical History  Diagnosis Date  . Hypertension   . Headache     migraine  . GERD (gastroesophageal reflux disease)   . Polycystic kidney disease   . Obesity   . Menorrhagia   . Murmur, cardiac     Echo WNL 2009   Past Surgical History  Procedure Laterality Date  . Tonsillectomy    . Ureteral stent placement  2002  . Kidney cyst removal  2002  . Endometrial biopsy  2010   History   Social History  . Marital Status: Divorced    Spouse Name: N/A    Number of Children: 2  . 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 Smoking status: Never Smoker   . Smokeless tobacco: Not on file  . Alcohol Use: 0.5 oz/week    1 drink(s) per week  . Drug Use: No  . Sexually Active: Not Currently   Other Topics Concern  . Not on file   Social History Narrative  . No narrative on file   Family History  Problem Relation Age of Onset  . Diabetes Mother   . Hypertension Mother   . Diabetes Father   . Hypertension Father   . Migraines Mother   . Migraines Sister   . Cancer Paternal Grandmother     lung  . Cancer Paternal Grandfather     bladder, prostate  . Heart disease Paternal Grandfather    Patient Active Problem List   Diagnosis Date Noted  . Essential hypertension, benign 08/09/2011  . GERD (gastroesophageal reflux disease) 08/09/2011  . Migraine 08/09/2011  . Polycystic kidney disease 08/09/2011  . Obesity 08/09/2011  . H/O cardiac murmur 08/09/2011  . Abnormal mammogram 08/09/2011   Current Outpatient Prescriptions on  File Prior to Visit  Medication Sig Dispense Refill  . Biotin 5000 MCG CAPS Take 1 capsule by mouth daily.      . Calcium Carbonate-Vitamin D (CALCIUM-VITAMIN D) 600-200 MG-UNIT CAPS Take 1 tablet by mouth 2 (two) times daily.      . Multiple Vitamin (MULTIVITAMIN) tablet Take 1 tablet by mouth daily.      . norgestrel-ethinyl estradiol (LO/OVRAL,CRYSELLE) 0.3-30 MG-MCG tablet Take 1 tablet by mouth daily.  1 Package  11  . SUMAtriptan (IMITREX) 100 MG tablet Take 100 mg by mouth. Take at the onset of HA.  May repeat in 2 hours if needed.  Max dose 2/24 hours, 4/week       . atenolol (TENORMIN) 50 MG tablet Take 1 tablet (50 mg total) by mouth daily.  90 tablet  3  . L-Lysine 1000 MG TABS Take 1 tablet by mouth daily.       No current facility-administered medications on file prior to visit.       Review of Systems See HPI    Objective:   Physical Exam Physical Exam  Constitutional: She is oriented to person, place, and time. She appears well-developed and well-nourished. She is cooperative.  HENT:  Head: Normocephalic and atraumatic.  Right Ear: A middle ear effusion is present.  Left Ear: A middle ear effusion is present.  Nose: Mucosal edema present. Right sinus exhibits maxillary sinus tenderness. Left sinus exhibits maxillary sinus tenderness.  Mouth/Throat: Posterior oropharyngeal erythema present.  Serous effusion bilaterally  Eyes: Conjunctivae and EOM are normal. Pupils are equal, round, and reactive to light.  Neck: Neck supple. Carotid bruit is not present. No mass present.  Cardiovascular: Regular rhythm, normal heart sounds, intact distal pulses and normal pulses. Exam reveals no gallop and no friction rub.  No murmur heard.  Pulmonary/Chest: Breath sounds normal. She has no wheezes. She has no rhonchi. She has no rales.  Neurological: She is alert and oriented to person, place, and time.  Skin: Skin is warm and dry. No abrasion, no bruising, no ecchymosis and no rash  noted. No cyanosis. Nails show no clubbing.  Psychiatric: She has a normal mood and affect. Her speech is normal and behavior is normal.             Assessment & Plan:  Sinusitis    z-pak   Nasal congestion  OK for sudafed  See me if not better

## 2013-03-20 ENCOUNTER — Other Ambulatory Visit: Payer: Self-pay | Admitting: *Deleted

## 2013-03-20 DIAGNOSIS — IMO0001 Reserved for inherently not codable concepts without codable children: Secondary | ICD-10-CM

## 2013-03-20 NOTE — Telephone Encounter (Signed)
Refill request last MM 09/2012

## 2013-03-21 MED ORDER — NORGESTREL-ETHINYL ESTRADIOL 0.3-30 MG-MCG PO TABS: 1.0000 | ORAL_TABLET | Freq: Every day | ORAL | Status: DC

## 2013-03-22 ENCOUNTER — Other Ambulatory Visit: Payer: Self-pay | Admitting: *Deleted

## 2013-03-22 NOTE — Telephone Encounter (Signed)
Duplicate request

## 2013-03-26 ENCOUNTER — Other Ambulatory Visit: Payer: Self-pay | Admitting: *Deleted

## 2013-04-04 ENCOUNTER — Ambulatory Visit (INDEPENDENT_AMBULATORY_CARE_PROVIDER_SITE_OTHER): Payer: BC Managed Care – PPO | Admitting: *Deleted

## 2013-04-04 DIAGNOSIS — Z23 Encounter for immunization: Secondary | ICD-10-CM

## 2013-04-04 NOTE — Progress Notes (Signed)
Patient ID: Kim Fox, female   DOB: 1965-07-12, 47 y.o.   MRN: [id number] [name] is here for her annual flu vaccination. She is feeling well with no fevers. VIS given to pt.

## 2013-06-21 DIAGNOSIS — I82401 Acute embolism and thrombosis of unspecified deep veins of right lower extremity: Secondary | ICD-10-CM

## 2013-06-21 HISTORY — DX: Acute embolism and thrombosis of unspecified deep veins of right lower extremity: I82.401

## 2013-08-30 ENCOUNTER — Other Ambulatory Visit: Payer: Self-pay | Admitting: *Deleted

## 2013-08-30 DIAGNOSIS — I10 Essential (primary) hypertension: Secondary | ICD-10-CM

## 2013-08-30 NOTE — Telephone Encounter (Signed)
Refill request

## 2013-09-02 MED ORDER — ATENOLOL 50 MG PO TABS: 50.0000 mg | ORAL_TABLET | Freq: Every day | ORAL | Status: DC

## 2013-09-03 ENCOUNTER — Other Ambulatory Visit: Payer: Self-pay | Admitting: *Deleted

## 2013-09-03 DIAGNOSIS — I10 Essential (primary) hypertension: Secondary | ICD-10-CM

## 2013-09-05 ENCOUNTER — Other Ambulatory Visit: Payer: Self-pay | Admitting: *Deleted

## 2013-09-05 NOTE — Telephone Encounter (Signed)
duplicate

## 2013-09-07 ENCOUNTER — Other Ambulatory Visit: Payer: Self-pay | Admitting: Internal Medicine

## 2013-09-10 NOTE — Telephone Encounter (Signed)
Received another request for atenolol medication was refilled on 03/15 LVM message for pt to return call to confirm that she has received the RX pt returned call and confirmed that she has the RX

## 2013-09-25 ENCOUNTER — Other Ambulatory Visit: Payer: Self-pay | Admitting: *Deleted

## 2013-09-25 DIAGNOSIS — Z139 Encounter for screening, unspecified: Secondary | ICD-10-CM

## 2013-10-01 ENCOUNTER — Telehealth: Payer: Self-pay | Admitting: *Deleted

## 2013-10-01 DIAGNOSIS — Z139 Encounter for screening, unspecified: Secondary | ICD-10-CM

## 2013-10-01 NOTE — Telephone Encounter (Signed)
Pt calls with a concern that her insurance will not cover any of the 3D mm cost. Pt will schedule an appointment for regular mm down stairs. MM order place

## 2013-10-05 LAB — COMPREHENSIVE METABOLIC PANEL
ALBUMIN: 3.6 g/dL (ref 3.5–5.2)
ALT: 16 U/L (ref 0–35)
AST: 19 U/L (ref 0–37)
Alkaline Phosphatase: 53 U/L (ref 39–117)
BUN: 20 mg/dL (ref 6–23)
CALCIUM: 8.9 mg/dL (ref 8.4–10.5)
CHLORIDE: 105 meq/L (ref 96–112)
CO2: 27 meq/L (ref 19–32)
Creat: 0.8 mg/dL (ref 0.50–1.10)
Glucose, Bld: 92 mg/dL (ref 70–99)
Potassium: 4.6 mEq/L (ref 3.5–5.3)
Sodium: 140 mEq/L (ref 135–145)
Total Bilirubin: 0.5 mg/dL (ref 0.2–1.2)
Total Protein: 7.1 g/dL (ref 6.0–8.3)

## 2013-10-05 LAB — LIPID PANEL
CHOLESTEROL: 172 mg/dL (ref 0–200)
HDL: 50 mg/dL (ref >39)
LDL Cholesterol: 101 mg/dL — ABNORMAL HIGH (ref 0–99)
TRIGLYCERIDES: 107 mg/dL (ref <150)
Total CHOL/HDL Ratio: 3.4 Ratio
VLDL: 21 mg/dL (ref 0–40)

## 2013-10-05 LAB — CBC WITH DIFFERENTIAL/PLATELET
BASOS ABS: 0.1 10*3/uL (ref 0.0–0.1)
Basophils Relative: 1 % (ref 0–1)
Eosinophils Absolute: 0.1 10*3/uL (ref 0.0–0.7)
Eosinophils Relative: 1 % (ref 0–5)
HEMATOCRIT: 42.7 % (ref 36.0–46.0)
HEMOGLOBIN: 14.9 g/dL (ref 12.0–15.0)
LYMPHS PCT: 28 % (ref 12–46)
Lymphs Abs: 2.2 10*3/uL (ref 0.7–4.0)
MCH: 30.5 pg (ref 26.0–34.0)
MCHC: 34.9 g/dL (ref 30.0–36.0)
MCV: 87.5 fL (ref 78.0–100.0)
MONO ABS: 0.6 10*3/uL (ref 0.1–1.0)
Monocytes Relative: 8 % (ref 3–12)
NEUTROS ABS: 4.8 10*3/uL (ref 1.7–7.7)
NEUTROS PCT: 62 % (ref 43–77)
Platelets: 300 10*3/uL (ref 150–400)
RBC: 4.88 MIL/uL (ref 3.87–5.11)
RDW: 13.1 % (ref 11.5–15.5)
WBC: 7.8 10*3/uL (ref 4.0–10.5)

## 2013-10-05 LAB — TSH: TSH: 1.316 u[IU]/mL (ref 0.350–4.500)

## 2013-10-06 LAB — VITAMIN D 25 HYDROXY (VIT D DEFICIENCY, FRACTURES): Vit D, 25-Hydroxy: 57 ng/mL (ref 30–89)

## 2013-10-08 ENCOUNTER — Encounter: Payer: Self-pay | Admitting: *Deleted

## 2013-10-11 ENCOUNTER — Encounter: Payer: Self-pay | Admitting: Internal Medicine

## 2013-10-11 ENCOUNTER — Ambulatory Visit (INDEPENDENT_AMBULATORY_CARE_PROVIDER_SITE_OTHER): Payer: BC Managed Care – PPO | Admitting: Internal Medicine

## 2013-10-11 VITALS — BP 134/78 | HR 59 | Temp 97.7°F | Resp 18 | Wt 229.0 lb

## 2013-10-11 DIAGNOSIS — L723 Sebaceous cyst: Secondary | ICD-10-CM

## 2013-10-11 DIAGNOSIS — N949 Unspecified condition associated with female genital organs and menstrual cycle: Secondary | ICD-10-CM

## 2013-10-11 DIAGNOSIS — Z0000 Encounter for general adult medical examination without abnormal findings: Secondary | ICD-10-CM

## 2013-10-11 DIAGNOSIS — I10 Essential (primary) hypertension: Secondary | ICD-10-CM

## 2013-10-11 DIAGNOSIS — Z124 Encounter for screening for malignant neoplasm of cervix: Secondary | ICD-10-CM

## 2013-10-11 DIAGNOSIS — Z139 Encounter for screening, unspecified: Secondary | ICD-10-CM

## 2013-10-11 DIAGNOSIS — G43909 Migraine, unspecified, not intractable, without status migrainosus: Secondary | ICD-10-CM

## 2013-10-11 DIAGNOSIS — K219 Gastro-esophageal reflux disease without esophagitis: Secondary | ICD-10-CM

## 2013-10-11 DIAGNOSIS — L84 Corns and callosities: Secondary | ICD-10-CM

## 2013-10-11 DIAGNOSIS — Q613 Polycystic kidney, unspecified: Secondary | ICD-10-CM

## 2013-10-11 DIAGNOSIS — Z1151 Encounter for screening for human papillomavirus (HPV): Secondary | ICD-10-CM

## 2013-10-11 DIAGNOSIS — N938 Other specified abnormal uterine and vaginal bleeding: Secondary | ICD-10-CM | POA: Insufficient documentation

## 2013-10-11 DIAGNOSIS — E669 Obesity, unspecified: Secondary | ICD-10-CM

## 2013-10-11 NOTE — Patient Instructions (Signed)
Give pt phone number to make following appointments  [facility]  Podiatry  Dr. Caffie Pinto  Dermatology  Dr Delman Cheadle or Dr. Renda Rolls  See me as needed

## 2013-10-11 NOTE — Progress Notes (Signed)
Subjective:    Patient ID: Kim Fox Section, female    DOB: 1966/04/28, 48 y.o.   MRN: [id number]  HPI [name] is here for CPE  HTN  Tolerating meds  She does get Right elbow tenditis  Skin lesion  She has longstading scalp nodule thaat seem to be getting larger    Foot pain and callus  She would like to see a podiatrist  She is having irregular menstrual bleeding some months heavy with clots.    She is on OC"s  She has seen [facility] in past.    Allergies  Allergen Reactions  . Macrodantin Rash  . Penicillins Rash   Past Medical History  Diagnosis Date  . Hypertension   . Headache(784.0)     migraine  . GERD (gastroesophageal reflux disease)   . Polycystic kidney disease   . Obesity   . Menorrhagia   . Murmur, cardiac     Echo WNL 2009   Past Surgical History  Procedure Laterality Date  . Tonsillectomy    . Ureteral stent placement  2002  . Kidney cyst removal  2002  . Endometrial biopsy  2010   History   Social History  . Marital Status: Divorced    Spouse Name: N/A    Number of Children: 2  . 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 Smoking status: Never Smoker   . Smokeless tobacco: Not on file  . Alcohol Use: 0.5 oz/week    1 drink(s) per week  . Drug Use: No  . Sexual Activity: Not Currently   Other Topics Concern  . Not on file   Social History Narrative  . No narrative on file   Family History  Problem Relation Age of Onset  . Diabetes Mother   . Hypertension Mother   . Diabetes Father   . Hypertension Father   . Migraines Mother   . Migraines Sister   . Cancer Paternal Grandmother     lung  . Cancer Paternal Grandfather     bladder, prostate  . Heart disease Paternal Grandfather    Patient Active Problem List   Diagnosis Date Noted  . Essential hypertension, benign 08/09/2011  . GERD (gastroesophageal reflux disease) 08/09/2011  .  Migraine 08/09/2011  . Polycystic kidney disease 08/09/2011  . Obesity 08/09/2011  . H/O cardiac murmur 08/09/2011  . Abnormal mammogram 08/09/2011   Current Outpatient Prescriptions on File Prior to Visit  Medication Sig Dispense Refill  . atenolol (TENORMIN) 50 MG tablet Take 1 tablet (50 mg total) by mouth daily.  90 tablet  3  . Biotin 5000 MCG CAPS Take 1 capsule by mouth daily.      . Calcium Carbonate-Vitamin D (CALCIUM-VITAMIN D) 600-200 MG-UNIT CAPS Take 1 tablet by mouth 2 (two) times daily.      Marland Kitchen L-Lysine 1000 MG TABS Take 1 tablet by mouth daily.      . Multiple Vitamin (MULTIVITAMIN) tablet Take 1 tablet by mouth daily.      . norgestrel-ethinyl estradiol (LO/OVRAL,CRYSELLE) 0.3-30 MG-MCG tablet Take 1 tablet by mouth daily.  1 Package  11  . SUMAtriptan (IMITREX) 100 MG tablet Take 100 mg by mouth. Take at the onset of HA.  May repeat in 2 hours if needed.  Max dose 2/24 hours, 4/week       . loratadine (CLARITIN) 10 MG tablet Take 10 mg by mouth daily.      Marland Kitchen  pseudoephedrine (SUDAFED) 120 MG 12 hr tablet Take 120 mg by mouth every 12 (twelve) hours.       No current facility-administered medications on file prior to visit.       Review of Systems     Objective:   Physical Exam  Physical Exam  Vital signs and nursing note reviewed  Constitutional: She is oriented to person, place, and time. She appears well-developed and well-nourished. She is cooperative.  HENT:  Head: Normocephalic and atraumatic.  Right Ear: Tympanic membrane normal.  Left Ear: Tympanic membrane normal.  Nose: Nose normal.  Mouth/Throat: Oropharynx is clear and moist and mucous membranes are normal. No oropharyngeal exudate or posterior oropharyngeal erythema.  Eyes: Conjunctivae and EOM are normal. Pupils are equal, round, and reactive to light.  Neck: Neck supple. No JVD present. Carotid bruit is not present. No mass and no thyromegaly present.  Cardiovascular: Regular rhythm, normal  heart sounds, intact distal pulses and normal pulses.  Exam reveals no gallop and no friction rub.   No murmur heard. Pulses:      Dorsalis pedis pulses are 2+ on the right side, and 2+ on the left side.  Pulmonary/Chest: Breath sounds normal. She has no wheezes. She has no rhonchi. She has no rales. Right breast exhibits no mass, no nipple discharge and no skin change. Left breast exhibits no mass, no nipple discharge and no skin change.  Abdominal: Soft. Bowel sounds are normal. She exhibits no distension and no mass. There is no hepatosplenomegaly. There is no tenderness. There is no CVA tenderness.   Rectal no mass guaiac neg Genitourinary: Rectum normal, vagina normal and uterus normal. Rectal exam shows no mass. Guaiac negative stool. No labial fusion. There is no lesion on the right labia. There is no lesion on the left labia. Cervix exhibits no motion tenderness. Right adnexum displays no mass, no tenderness and no fullness. Left adnexum displays no mass, no tenderness and no fullness. No erythema around the vagina.  Musculoskeletal:       No active synovitis to any joint.    Lymphadenopathy:       Right cervical: No superficial cervical adenopathy present.      Left cervical: No superficial cervical adenopathy present.       Right axillary: No pectoral and no lateral adenopathy present.       Left axillary: No pectoral and no lateral adenopathy present.      Right: No inguinal adenopathy present.       Left: No inguinal adenopathy present.  Neurological: She is alert and oriented to person, place, and time. She has normal strength and normal reflexes. No cranial nerve deficit or sensory deficit. She displays a negative Romberg sign. Coordination and gait normal.  Skin: Skin is warm and dry. No abrasion, no bruising, no ecchymosis and no rash noted. No cyanosis. Nails show no clubbing.   Scalp  R side of scalp Psychiatric: She has a normal mood and affect. Her speech is normal and behavior  is normal.          Assessment & Plan:  Health Maintenance  Pap today    Will get mm in am  See scanned shhet  HTN  Continue meds  DUB  Pt has been seen by [facility] in the past .  She will call for appt  Elbow tendinitis:    OK for short course of Relafen. 1 week  If persists we discussed referral to sport medicine  Foot callus  Ok to see podiatry  Number given        Assessment & Plan:

## 2013-10-12 ENCOUNTER — Ambulatory Visit (HOSPITAL_BASED_OUTPATIENT_CLINIC_OR_DEPARTMENT_OTHER)
Admission: RE | Admit: 2013-10-12 | Discharge: 2013-10-12 | Disposition: A | Discharge status: Home / Self Care | Payer: BC Managed Care – PPO | Source: Ambulatory Visit | Attending: Internal Medicine | Admitting: Internal Medicine

## 2013-10-12 DIAGNOSIS — Z1231 Encounter for screening mammogram for malignant neoplasm of breast: Secondary | ICD-10-CM | POA: Insufficient documentation

## 2013-10-15 ENCOUNTER — Telehealth: Payer: Self-pay | Admitting: *Deleted

## 2013-10-15 ENCOUNTER — Ambulatory Visit (HOSPITAL_COMMUNITY): Payer: BC Managed Care – PPO

## 2013-10-15 NOTE — Telephone Encounter (Signed)
Pt states that a rx for relefen was to be called in

## 2013-10-16 MED ORDER — NABUMETONE 500 MG PO TABS: 500.0000 mg | ORAL_TABLET | Freq: Two times a day (BID) | ORAL | Status: DC | PRN

## 2013-10-16 NOTE — Telephone Encounter (Signed)
Notified pt that relefen was sent to [facility]

## 2014-02-11 ENCOUNTER — Other Ambulatory Visit: Payer: Self-pay | Admitting: Internal Medicine

## 2014-02-12 NOTE — Telephone Encounter (Signed)
Requested Medications     Medication name:  Name from pharmacy:  LOW-OGESTREL 0.3-30 MG-MCG tablet  LOW-OGESTREL TABLETS 28'S    The source prescription has been discontinued.    Sig: TAKE 1 TABLET BY MOUTH DAILY    Dispense: 28 tablet Refills: 0 Start: 02/11/2014  Class: Normal    Requested on: 02/11/2014    Originally ordered on: 12/09/2010 Last refill: 01/16/2014 Order History and Details

## 2014-03-25 ENCOUNTER — Emergency Department (HOSPITAL_BASED_OUTPATIENT_CLINIC_OR_DEPARTMENT_OTHER)
Admission: EM | Admit: 2014-03-25 | Discharge: 2014-03-25 | Disposition: A | Discharge status: Home / Self Care | Payer: BC Managed Care – PPO | Attending: Emergency Medicine | Admitting: Emergency Medicine

## 2014-03-25 ENCOUNTER — Encounter (HOSPITAL_BASED_OUTPATIENT_CLINIC_OR_DEPARTMENT_OTHER): Payer: Self-pay | Admitting: Emergency Medicine

## 2014-03-25 ENCOUNTER — Emergency Department (HOSPITAL_BASED_OUTPATIENT_CLINIC_OR_DEPARTMENT_OTHER): Payer: BC Managed Care – PPO

## 2014-03-25 DIAGNOSIS — I10 Essential (primary) hypertension: Secondary | ICD-10-CM | POA: Diagnosis not present

## 2014-03-25 DIAGNOSIS — Z79899 Other long term (current) drug therapy: Secondary | ICD-10-CM | POA: Insufficient documentation

## 2014-03-25 DIAGNOSIS — R011 Cardiac murmur, unspecified: Secondary | ICD-10-CM | POA: Insufficient documentation

## 2014-03-25 DIAGNOSIS — E669 Obesity, unspecified: Secondary | ICD-10-CM | POA: Diagnosis not present

## 2014-03-25 DIAGNOSIS — Z8742 Personal history of other diseases of the female genital tract: Secondary | ICD-10-CM | POA: Insufficient documentation

## 2014-03-25 DIAGNOSIS — Z880 Allergy status to penicillin: Secondary | ICD-10-CM | POA: Insufficient documentation

## 2014-03-25 DIAGNOSIS — M79604 Pain in right leg: Secondary | ICD-10-CM

## 2014-03-25 DIAGNOSIS — Z87718 Personal history of other specified (corrected) congenital malformations of genitourinary system: Secondary | ICD-10-CM | POA: Insufficient documentation

## 2014-03-25 DIAGNOSIS — Z8719 Personal history of other diseases of the digestive system: Secondary | ICD-10-CM | POA: Insufficient documentation

## 2014-03-25 DIAGNOSIS — M7989 Other specified soft tissue disorders: Secondary | ICD-10-CM

## 2014-03-25 DIAGNOSIS — I824Z1 Acute embolism and thrombosis of unspecified deep veins of right distal lower extremity: Secondary | ICD-10-CM | POA: Diagnosis not present

## 2014-03-25 DIAGNOSIS — M79671 Pain in right foot: Secondary | ICD-10-CM | POA: Diagnosis present

## 2014-03-25 LAB — CBC WITH DIFFERENTIAL/PLATELET
Basophils Absolute: 0.1 10*3/uL (ref 0.0–0.1)
Basophils Relative: 1 % (ref 0–1)
Eosinophils Absolute: 0.1 10*3/uL (ref 0.0–0.7)
Eosinophils Relative: 1 % (ref 0–5)
HCT: 42.8 % (ref 36.0–46.0)
HEMOGLOBIN: 14.6 g/dL (ref 12.0–15.0)
Lymphocytes Relative: 22 % (ref 12–46)
Lymphs Abs: 2.3 10*3/uL (ref 0.7–4.0)
MCH: 30.2 pg (ref 26.0–34.0)
MCHC: 34.1 g/dL (ref 30.0–36.0)
MCV: 88.4 fL (ref 78.0–100.0)
Monocytes Absolute: 0.8 10*3/uL (ref 0.1–1.0)
Monocytes Relative: 7 % (ref 3–12)
NEUTROS ABS: 7.3 10*3/uL (ref 1.7–7.7)
Neutrophils Relative %: 69 % (ref 43–77)
PLATELETS: 236 10*3/uL (ref 150–400)
RBC: 4.84 MIL/uL (ref 3.87–5.11)
RDW: 13 % (ref 11.5–15.5)
WBC: 10.6 10*3/uL — AB (ref 4.0–10.5)

## 2014-03-25 LAB — BASIC METABOLIC PANEL
Anion gap: 13 (ref 5–15)
BUN: 21 mg/dL (ref 6–23)
CHLORIDE: 103 meq/L (ref 96–112)
CO2: 23 mEq/L (ref 19–32)
Calcium: 9.9 mg/dL (ref 8.4–10.5)
Creatinine, Ser: 0.8 mg/dL (ref 0.50–1.10)
GFR calc Af Amer: >90 mL/min (ref >90)
GFR, EST NON AFRICAN AMERICAN: 87 mL/min — AB (ref >90)
GLUCOSE: 93 mg/dL (ref 70–99)
POTASSIUM: 4.5 meq/L (ref 3.7–5.3)
SODIUM: 139 meq/L (ref 137–147)

## 2014-03-25 LAB — PROTIME-INR
INR: 1.03 (ref 0.00–1.49)
PROTHROMBIN TIME: 13.5 s (ref 11.6–15.2)

## 2014-03-25 MED ORDER — XARELTO VTE STARTER PACK 15 & 20 MG PO TBPK: 15.0000 mg | ORAL_TABLET | ORAL | Status: DC

## 2014-03-25 MED ORDER — RIVAROXABAN 15 MG PO TABS
15.0000 mg | ORAL_TABLET | Freq: Once | ORAL | Status: AC
  Administered 2014-03-25: 15 mg via ORAL
  Filled 2014-03-25: qty 1

## 2014-03-25 NOTE — ED Provider Notes (Signed)
CSN: [id number]     Arrival date & time 03/25/14  0818 History   First MD Initiated Contact with Patient 03/25/14 [phone]     Chief Complaint  Patient presents with  . Leg Pain     (Consider location/radiation/quality/duration/timing/severity/associated sxs/prior Treatment) HPI 48 y.o female with pain and swelling and redness to right lower calf.  Patient noted symptoms beginning ofn Saturday and has noted some increased redness to lower  Back of calf.  She denies sob, fever, numbness, or weakness.  She has no prior history of dvt but has recently (two weeks ago) been on a 3.5 hour car ride and is on oral estrogen for birth control.     Past Medical History  Diagnosis Date  . Hypertension   . Headache(784.0)     migraine  . GERD (gastroesophageal reflux disease)   . Polycystic kidney disease   . Obesity   . Menorrhagia   . Murmur, cardiac     Echo WNL 2009   Past Surgical History  Procedure Laterality Date  . Tonsillectomy    . Ureteral stent placement  2002  . Kidney cyst removal  2002  . Endometrial biopsy  2010   Family History  Problem Relation Age of Onset  . Diabetes Mother   . Hypertension Mother   . Diabetes Father   . Hypertension Father   . Migraines Mother   . Migraines Sister   . Cancer Paternal Grandmother     lung  . Cancer Paternal Grandfather     bladder, prostate  . Heart disease Paternal Grandfather    History  Substance Use Topics  . Smoking status: Never Smoker   . Smokeless tobacco: Not on file  . Alcohol Use: 0.5 oz/week    1 drink(s) per week   OB History   Grav Para Term Preterm Abortions TAB SAB Ect Mult Living   2 2             Review of Systems  All other systems reviewed and are negative.     Allergies  Macrodantin and Penicillins  Home Medications   Prior to Admission medications   Medication Sig Start Date End Date Taking? Authorizing Provider  atenolol (TENORMIN) 50 MG tablet Take 1 tablet (50 mg total) by mouth daily.   08/30/14  Lanice Shirts, MD  Biotin 5000 MCG CAPS Take 1 capsule by mouth daily.    Historical Provider, MD  Calcium Carbonate-Vitamin D (CALCIUM-VITAMIN D) 600-200 MG-UNIT CAPS Take 1 tablet by mouth 2 (two) times daily.    Historical Provider, MD  L-Lysine 1000 MG TABS Take 1 tablet by mouth daily.    Historical Provider, MD  loratadine (CLARITIN) 10 MG tablet Take 10 mg by mouth daily.    Historical Provider, MD  LOW-OGESTREL 0.3-30 MG-MCG tablet TAKE 1 TABLET BY MOUTH DAILY 02/12/14   Lanice Shirts, MD  Multiple Vitamin (MULTIVITAMIN) tablet Take 1 tablet by mouth daily.    Historical Provider, MD  nabumetone (RELAFEN) 500 MG tablet Take 1 tablet (500 mg total) by mouth 2 (two) times daily as needed. 10/16/13   Lanice Shirts, MD  norgestrel-ethinyl estradiol (LO/OVRAL,CRYSELLE) 0.3-30 MG-MCG tablet Take 1 tablet by mouth daily. 03/20/13   Lanice Shirts, MD  pseudoephedrine (SUDAFED) 120 MG 12 hr tablet Take 120 mg by mouth every 12 (twelve) hours.    Historical Provider, MD  SUMAtriptan (IMITREX) 100 MG tablet Take 100 mg by mouth. Take at the onset of HA.  May repeat in 2 hours if needed.  Max dose 2/24 hours, 4/week     Historical Provider, MD   BP 129/77  Pulse 64  Temp(Src) 98.4 F (36.9 C) (Oral)  Resp 18  Ht 5' 7.5" (1.715 m)  Wt 225 lb (102.059 kg)  BMI 34.70 kg/m2  SpO2 99%  LMP 03/11/2014 Physical Exam  Nursing note and vitals reviewed. Constitutional: She is oriented to person, place, and time. She appears well-developed and well-nourished.  HENT:  Head: Normocephalic and atraumatic.  Right Ear: External ear normal.  Left Ear: External ear normal.  Nose: Nose normal.  Mouth/Throat: Oropharynx is clear and moist.  Eyes: Conjunctivae and EOM are normal. Pupils are equal, round, and reactive to light.  Neck: Normal range of motion. Neck supple. No JVD present. No tracheal deviation present. No thyromegaly present.  Cardiovascular: Normal rate,  regular rhythm, normal heart sounds and intact distal pulses.   Pulmonary/Chest: Effort normal and breath sounds normal. No respiratory distress. She has no wheezes.  Abdominal: Soft. Bowel sounds are normal. She exhibits no mass. There is no tenderness. There is no guarding.  Musculoskeletal:       Legs: Erythema Pulses intact, no pedal edema noted.   Lymphadenopathy:    She has no cervical adenopathy.  Neurological: She is alert and oriented to person, place, and time. She has normal reflexes. No cranial nerve deficit or sensory deficit. Gait normal. GCS eye subscore is 4. GCS verbal subscore is 5. GCS motor subscore is 6.  Reflex Scores:      Bicep reflexes are 2+ on the right side and 2+ on the left side.      Patellar reflexes are 2+ on the right side and 2+ on the left side. Strength is 5/5 bilateral elbow flexor/extensors, wrist extension/flexion, intrinsic hand strength equal Bilateral hip flexion/extension 5/5, knee flexion/extension 5/5, ankle 5/5 flexion extension    Skin: Skin is warm and dry.  Psychiatric: She has a normal mood and affect. Her behavior is normal. Judgment and thought content normal.    ED Course  Procedures (including critical care time) Labs Review Labs Reviewed - No data to display  Imaging Review No results found.   EKG Interpretation None      MDM   11:17 AM Awaiting ultrasound report- radiology reports that [place] is "locked on " by radiologist  12:22 PM Patient with peroneal clot not into deep venous system.   Final diagnoses:  Calf DVT (deep venous thrombosis), right    Discussed with Dr. Coralyn Mark and plan anticoagulation with xarelto pending recheck with her.  Patient has provoking factors, reversible risk factor (stopping oral hormones) , and no ongoing immobilization but no contraindication to anticoagulation and some symptoms of dvt.  Discussed with patient and return precautions of chest pain dyspnea or increased redness or swelling  given.  She voices understanding.      Shaune Pollack, MD 03/25/14 [phone]

## 2014-03-25 NOTE — ED Notes (Signed)
Reports pain/swelling to right leg. Constant since Saturday. Pt reports redness and warmth to right calf. Reports driving to beach recently without stops.

## 2014-03-25 NOTE — Discharge Instructions (Signed)
Call Dr. Coralyn Mark today for recheck 4-7 days. We will place you on xarelto until follow up.   Deep Vein Thrombosis A deep vein thrombosis (DVT) is a blood clot that develops in the deep, larger veins of the leg, arm, or pelvis. These are more dangerous than clots that might form in veins near the surface of the body. A DVT can lead to serious and even life-threatening complications if the clot breaks off and travels in the bloodstream to the lungs.  A DVT can damage the valves in your leg veins so that instead of flowing upward, the blood pools in the lower leg. This is called post-thrombotic syndrome, and it can result in pain, swelling, discoloration, and sores on the leg. CAUSES Usually, several things contribute to the formation of blood clots. Contributing factors include:  The flow of blood slows down.  The inside of the vein is damaged in some way.  You have a condition that makes blood clot more easily. RISK FACTORS Some people are more likely than others to develop blood clots. Risk factors include:   Smoking.  Being overweight (obese).  Sitting or lying still for a long time. This includes long-distance travel, paralysis, or recovery from an illness or surgery. Other factors that increase risk are:   Older age, especially over 67 years of age.  Having a family history of blood clots or if you have already had a blot clot.  Having major or lengthy surgery. This is especially true for surgery on the hip, knee, or belly (abdomen). Hip surgery is particularly high risk.  Having a long, thin tube (catheter) placed inside a vein during a medical procedure.  Breaking a hip or leg.  Having cancer or cancer treatment.  Pregnancy and childbirth.  Hormone changes make the blood clot more easily during pregnancy.  The fetus puts pressure on the veins of the pelvis.  There is a risk of injury to veins during delivery or a caesarean delivery. The risk is highest just after  childbirth.  Medicines containing the female hormone estrogen. This includes birth control pills and hormone replacement therapy.  Other circulation or heart problems.  SIGNS AND SYMPTOMS When a clot forms, it can either partially or totally block the blood flow in that vein. Symptoms of a DVT can include:  Swelling of the leg or arm, especially if one side is much worse.  Warmth and redness of the leg or arm, especially if one side is much worse.  Pain in an arm or leg. If the clot is in the leg, symptoms may be more noticeable or worse when standing or walking. The symptoms of a DVT that has traveled to the lungs (pulmonary embolism, PE) usually start suddenly and include:  Shortness of breath.  Coughing.  Coughing up blood or blood-tinged mucus.  Chest pain. The chest pain is often worse with deep breaths.  Rapid heartbeat. Anyone with these symptoms should get emergency medical treatment right away. Do not wait to see if the symptoms will go away. Call your local emergency services (911 in the [place] if you have these symptoms. Do not drive yourself to the hospital. DIAGNOSIS If a DVT is suspected, your health care provider will take a full medical history and perform a physical exam. Tests that also may be required include:  Blood tests, including studies of the clotting properties of the blood.  Ultrasound to see if you have clots in your legs or lungs.  X-rays to show the flow  of blood when dye is injected into the veins (venogram).  Studies of your lungs if you have any chest symptoms. PREVENTION  Exercise the legs regularly. Take a brisk 30-minute walk every day.  Maintain a weight that is appropriate for your height.  Avoid sitting or lying in bed for long periods of time without moving your legs.  Women, particularly those over the age of 31 years, should consider the risks and benefits of taking estrogen medicines, including birth control pills.  Do not smoke,  especially if you take estrogen medicines.  Long-distance travel can increase your risk of DVT. You should exercise your legs by walking or pumping the muscles every hour.  Many of the risk factors above relate to situations that exist with hospitalization, either for illness, injury, or elective surgery. Prevention may include medical and nonmedical measures.  Your health care provider will assess you for the need for venous thromboembolism prevention when you are admitted to the hospital. If you are having surgery, your surgeon will assess you the day of or day after surgery. TREATMENT Once identified, a DVT can be treated. It can also be prevented in some circumstances. Once you have had a DVT, you may be at increased risk for a DVT in the future. The most common treatment for DVT is blood-thinning (anticoagulant) medicine, which reduces the blood's tendency to clot. Anticoagulants can stop new blood clots from forming and stop old clots from growing. They cannot dissolve existing clots. Your body does this by itself over time. Anticoagulants can be given by mouth, through an IV tube, or by injection. Your health care provider will determine the best program for you. Other medicines or treatments that may be used are:  Heparin or related medicines (low molecular weight heparin) are often the first treatment for a blood clot. They act quickly. However, they cannot be taken orally and must be given either in shot form or by IV tube.  Heparin can cause a fall in a component of blood that stops bleeding and forms blood clots (platelets). You will be monitored with blood tests to be sure this does not occur.  Warfarin is an anticoagulant that can be swallowed. It takes a few days to start working, so usually heparin or related medicines are used in combination. Once warfarin is working, heparin is usually stopped.  Factor Xa inhibitor medicines, such as rivaroxaban and apixaban, also reduce blood  clotting. These medicines are taken orally and can often be used without heparin or related medicines.  Less commonly, clot dissolving drugs (thrombolytics) are used to dissolve a DVT. They carry a high risk of bleeding, so they are used mainly in severe cases where your life or a part of your body is threatened.  Very rarely, a blood clot in the leg needs to be removed surgically.  If you are unable to take anticoagulants, your health care provider may arrange for you to have a filter placed in a main vein in your abdomen. This filter prevents clots from traveling to your lungs. HOME CARE INSTRUCTIONS  Take all medicines as directed by your health care provider.  Learn as much as you can about DVT.  Wear a medical alert bracelet or carry a medical alert card.  Ask your health care provider how soon you can go back to normal activities. It is important to stay active to prevent blood clots. If you are on anticoagulant medicine, avoid contact sports.  It is very important to exercise. This is  especially important while traveling, sitting, or standing for long periods of time. Exercise your legs by walking or by tightening and relaxing your leg muscles regularly. Take frequent walks.  You may need to wear compression stockings. These are tight elastic stockings that apply pressure to the lower legs. This pressure can help keep the blood in the legs from clotting. Taking Warfarin Warfarin is a daily medicine that is taken by mouth. Your health care provider will advise you on the length of treatment (usually 3-6 months, sometimes lifelong). If you take warfarin:  Understand how to take warfarin and foods that can affect how warfarin works in Veterinary surgeon.  Too much and too little warfarin are both dangerous. Too much warfarin increases the risk of bleeding. Too little warfarin continues to allow the risk for blood clots. Warfarin and Regular Blood Testing While taking warfarin, you will need to  have regular blood tests to measure your blood clotting time. These blood tests usually include both the prothrombin time (PT) and international normalized ratio (INR) tests. The PT and INR results allow your health care provider to adjust your dose of warfarin. It is very important that you have your PT and INR tested as often as directed by your health care provider.  Warfarin and Your Diet Avoid major changes in your diet, or notify your health care provider before changing your diet. Arrange a visit with a registered dietitian to answer your questions. Many foods, especially foods high in vitamin K, can interfere with warfarin and affect the PT and INR results. You should eat a consistent amount of foods high in vitamin K. Foods high in vitamin K include:   Spinach, kale, broccoli, cabbage, collard and turnip greens, Brussels sprouts, peas, cauliflower, seaweed, and parsley.  Beef and pork liver.  Green tea.  Soybean oil. Warfarin with Other Medicines Many medicines can interfere with warfarin and affect the PT and INR results. You must:  Tell your health care provider about any and all medicines, vitamins, and supplements you take, including aspirin and other over-the-counter anti-inflammatory medicines. Be especially cautious with aspirin and anti-inflammatory medicines. Ask your health care provider before taking these.  Do not take or discontinue any prescribed or over-the-counter medicine except on the advice of your health care provider or pharmacist. Warfarin Side Effects Warfarin can have side effects, such as easy bruising and difficulty stopping bleeding. Ask your health care provider or pharmacist about other side effects of warfarin. You will need to:  Hold pressure over cuts for longer than usual.  Notify your dentist and other health care providers that you are taking warfarin before you undergo any procedures where bleeding may occur. Warfarin with Alcohol and Tobacco    Drinking alcohol frequently can increase the effect of warfarin, leading to excess bleeding. It is best to avoid alcoholic drinks or to consume only very small amounts while taking warfarin. Notify your health care provider if you change your alcohol intake.   Do not use any tobacco products including cigarettes, chewing tobacco, or electronic cigarettes. If you smoke, quit. Ask your health care provider for help with quitting smoking. Alternative Medicines to Warfarin: Factor Xa Inhibitor Medicines  These blood-thinning medicines are taken by mouth, usually for several weeks or longer. It is important to take the medicine every single day at the same time each day.  There are no regular blood tests required when using these medicines.  There are fewer food and drug interactions than with warfarin.  The side  effects of this class of medicine are similar to those of warfarin, including excessive bruising or bleeding. Ask your health care provider or pharmacist about other potential side effects. SEEK MEDICAL CARE IF:  You notice a rapid heartbeat.  You feel weaker or more tired than usual.  You feel faint.  You notice increased bruising.  You feel your symptoms are not getting better in the time expected.  You believe you are having side effects of medicine. SEEK IMMEDIATE MEDICAL CARE IF:  You have chest pain.  You have trouble breathing.  You have new or increased swelling or pain in one leg.  You cough up blood.  You notice blood in vomit, in a bowel movement, or in urine. MAKE SURE YOU:  Understand these instructions.  Will watch your condition.  Will get help right away if you are not doing well or get worse. Document Released: 06/07/2005 Document Revised: 10/22/2013 Document Reviewed: 02/12/2013 [facility] Patient Information 2015 [place], [place]. This information is not intended to replace advice given to you by your health care provider. Make sure you discuss any  questions you have with your health care provider.

## 2014-03-25 NOTE — ED Notes (Signed)
Discussed with radiology, pts [place] is being read at this time by radiologist

## 2014-03-26 ENCOUNTER — Other Ambulatory Visit: Payer: Self-pay | Admitting: *Deleted

## 2014-03-26 ENCOUNTER — Telehealth: Payer: Self-pay | Admitting: Internal Medicine

## 2014-03-26 DIAGNOSIS — I82401 Acute embolism and thrombosis of unspecified deep veins of right lower extremity: Secondary | ICD-10-CM

## 2014-03-26 NOTE — Telephone Encounter (Signed)
Spoke with Dr, Marin Olp who recommended 3 month anticoagulation and he will see her  Pt informed to see me on Thursday and I will repeat venous U/S  Will also refer ot hematology for coag work up

## 2014-03-28 ENCOUNTER — Ambulatory Visit (HOSPITAL_BASED_OUTPATIENT_CLINIC_OR_DEPARTMENT_OTHER)
Admission: RE | Admit: 2014-03-28 | Discharge: 2014-03-28 | Disposition: A | Discharge status: Home / Self Care | Payer: BC Managed Care – PPO | Source: Ambulatory Visit | Attending: Internal Medicine | Admitting: Internal Medicine

## 2014-03-28 ENCOUNTER — Ambulatory Visit (INDEPENDENT_AMBULATORY_CARE_PROVIDER_SITE_OTHER): Payer: BC Managed Care – PPO | Admitting: Internal Medicine

## 2014-03-28 ENCOUNTER — Encounter: Payer: Self-pay | Admitting: Internal Medicine

## 2014-03-28 VITALS — BP 123/78 | HR 61 | Temp 98.3°F | Resp 16 | Ht 67.5 in | Wt 235.0 lb

## 2014-03-28 DIAGNOSIS — I82591 Chronic embolism and thrombosis of other specified deep vein of right lower extremity: Secondary | ICD-10-CM | POA: Insufficient documentation

## 2014-03-28 DIAGNOSIS — R600 Localized edema: Secondary | ICD-10-CM

## 2014-03-28 DIAGNOSIS — Z7901 Long term (current) use of anticoagulants: Secondary | ICD-10-CM | POA: Insufficient documentation

## 2014-03-28 DIAGNOSIS — I82491 Acute embolism and thrombosis of other specified deep vein of right lower extremity: Secondary | ICD-10-CM

## 2014-03-28 DIAGNOSIS — I82401 Acute embolism and thrombosis of unspecified deep veins of right lower extremity: Secondary | ICD-10-CM

## 2014-03-28 NOTE — Patient Instructions (Signed)
Keep appt with henmatology next week  Continue Xarelto  Keep leg elevated

## 2014-03-28 NOTE — Progress Notes (Signed)
Subjective:    Patient ID: Kim Fox, female    DOB: 19-Apr-1966, 48 y.o.   MRN: [id number]  HPI  ER note;Patient with peroneal clot not into deep venous system.  Final diagnoses:   Calf DVT (deep venous thrombosis), right   Discussed with Dr. Coralyn Mark and plan anticoagulation with xarelto pending recheck with her. Patient has provoking factors, reversible risk factor (stopping oral hormones) , and no ongoing immobilization but no contraindication to anticoagulation and some symptoms of dvt. Discussed with patient and return precautions of chest pain dyspnea or increased redness or swelling given. She voices understanding.    Today  Kim Fox returns for ER follow up.  She has been on OC initially given to her by her GYN .Marland Kitchen  Found to have R peroneal DVT . Risk factor  Pt on Oc's "for years",  She had just returned long car ride from [place] ,  [place] had "blood clots" post hysterectomy and died age 35.  Unclear if mother truly had DVT.    Upon return from beach noted calf pain and swelling over 1-2 days.     NO chest pain,  SOB days ago but lasted for a few seconds.  NO current chest pain or SOB. She is on Xarelto bid now   R calf less swollen and less painful today    .  Repeat venous U/S this am shows no proximal propagation  Hematology eval pending next week.  Pt is working from home and has been keeping leg elevated   Allergies  Allergen Reactions  . Macrodantin Rash  . Penicillins Rash   Past Medical History  Diagnosis Date  . Hypertension   . Headache(784.0)     migraine  . GERD (gastroesophageal reflux disease)   . Polycystic kidney disease   . Obesity   . Menorrhagia   . Murmur, cardiac     Echo WNL 2009   Past Surgical History  Procedure Laterality Date  . Tonsillectomy    . Ureteral stent placement  2002  . Kidney cyst removal  2002  . Endometrial biopsy  2010   History   Social History  . Marital Status: Divorced    Spouse Name: N/A    Number of  Children: 2  . 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 Smoking status: Never Smoker   . Smokeless tobacco: Not on file  . Alcohol Use: 0.5 oz/week    1 drink(s) per week  . Drug Use: No  . Sexual Activity: Not Currently   Other Topics Concern  . Not on file   Social History Narrative  . No narrative on file   Family History  Problem Relation Age of Onset  . Diabetes Mother   . Hypertension Mother   . Diabetes Father   . Hypertension Father   . Migraines Mother   . Migraines Sister   . Cancer Paternal Grandmother     lung  . Cancer Paternal Grandfather     bladder, prostate  . Heart disease Paternal Grandfather    Patient Active Problem List   Diagnosis Date Noted  . DUB (dysfunctional uterine bleeding) 10/11/2013  . Essential hypertension, benign 08/09/2011  . GERD (gastroesophageal reflux disease) 08/09/2011  . Migraine 08/09/2011  . Polycystic kidney disease 08/09/2011  . Obesity 08/09/2011  . H/O cardiac murmur 08/09/2011  . Abnormal mammogram 08/09/2011   Current Outpatient Prescriptions on  File Prior to Visit  Medication Sig Dispense Refill  . atenolol (TENORMIN) 50 MG tablet Take 1 tablet (50 mg total) by mouth daily.  90 tablet  3  . Biotin 5000 MCG CAPS Take 1 capsule by mouth daily.      . Calcium Carbonate-Vitamin D (CALCIUM-VITAMIN D) 600-200 MG-UNIT CAPS Take 1 tablet by mouth 2 (two) times daily.      Marland Kitchen L-Lysine 1000 MG TABS Take 1 tablet by mouth daily.      Marland Kitchen loratadine (CLARITIN) 10 MG tablet Take 10 mg by mouth daily.      . LOW-OGESTREL 0.3-30 MG-MCG tablet TAKE 1 TABLET BY MOUTH DAILY  28 tablet  6  . Multiple Vitamin (MULTIVITAMIN) tablet Take 1 tablet by mouth daily.      . nabumetone (RELAFEN) 500 MG tablet Take 1 tablet (500 mg total) by mouth 2 (two) times daily as needed.  30 tablet  1  . norgestrel-ethinyl estradiol (LO/OVRAL,CRYSELLE) 0.3-30 MG-MCG  tablet Take 1 tablet by mouth daily.  1 Package  11  . pseudoephedrine (SUDAFED) 120 MG 12 hr tablet Take 120 mg by mouth every 12 (twelve) hours.      . SUMAtriptan (IMITREX) 100 MG tablet Take 100 mg by mouth. Take at the onset of HA.  May repeat in 2 hours if needed.  Max dose 2/24 hours, 4/week       . XARELTO STARTER PACK 15 & 20 MG TBPK Take 15-20 mg by mouth as directed. Take as directed on package: Start with one 15mg  tablet by mouth twice a day with food. On Day 22, switch to one 20mg  tablet once a day with food.  51 each  0   No current facility-administered medications on file prior to visit.      Review of Systems See HPI     Objective:   Physical Exam Physical Exam  Nursing note and vitals reviewed.  Constitutional: She is oriented to person, place, and time. She appears well-developed and well-nourished.  HENT:  Head: Normocephalic and atraumatic.  Cardiovascular: Normal rate and regular rhythm. Exam reveals no gallop and no friction rub.  No murmur heard.  Pulmonary/Chest: Breath sounds normal. She has no wheezes. She has no rales.  Neurological: She is alert and oriented to person, place, and time.  Skin: Skin is warm and dry.  LE left no edema Right  Homan's positive  Pain distal calf musle.  Good pedal pulse  Trace to minimal edema Psychiatric: She has a normal mood and affect. Her behavior is normal.          Assessment & Plan:  R peroneal DVT  :  No propagation,  On Xarelto bid,   She is off Oc's    Counseled of risk of bleeding and pt voices understanding,  . She will call if any problems   Advised to keep leg elevated   ?? coaguloapathy    Heme eval and  Thrombotic  work up pending.

## 2014-04-01 ENCOUNTER — Telehealth: Payer: Self-pay | Admitting: Hematology & Oncology

## 2014-04-01 NOTE — Telephone Encounter (Signed)
Left vm w NEW PATIENT today to remind them of their appointment with Dr. Ennever. Also, advised them to bring all medication bottles and insurance card information. ° °

## 2014-04-02 ENCOUNTER — Ambulatory Visit: Payer: BC Managed Care – PPO

## 2014-04-02 ENCOUNTER — Ambulatory Visit (HOSPITAL_BASED_OUTPATIENT_CLINIC_OR_DEPARTMENT_OTHER): Payer: BC Managed Care – PPO | Admitting: Lab

## 2014-04-02 DIAGNOSIS — I82491 Acute embolism and thrombosis of other specified deep vein of right lower extremity: Secondary | ICD-10-CM

## 2014-04-02 DIAGNOSIS — I82452 Acute embolism and thrombosis of left peroneal vein: Secondary | ICD-10-CM

## 2014-04-02 LAB — CBC WITH DIFFERENTIAL (CANCER CENTER ONLY)
BASO#: 0 10*3/uL (ref 0.0–0.2)
BASO%: 0.5 % (ref 0.0–2.0)
EOS%: 1.8 % (ref 0.0–7.0)
Eosinophils Absolute: 0.2 10*3/uL (ref 0.0–0.5)
HCT: 41.6 % (ref 34.8–46.6)
HEMOGLOBIN: 14.2 g/dL (ref 11.6–15.9)
LYMPH#: 2.9 10*3/uL (ref 0.9–3.3)
LYMPH%: 34.6 % (ref 14.0–48.0)
MCH: 30 pg (ref 26.0–34.0)
MCHC: 34.1 g/dL (ref 32.0–36.0)
MCV: 88 fL (ref 81–101)
MONO#: 0.8 10*3/uL (ref 0.1–0.9)
MONO%: 9.2 % (ref 0.0–13.0)
NEUT%: 53.9 % (ref 39.6–80.0)
NEUTROS ABS: 4.6 10*3/uL (ref 1.5–6.5)
Platelets: 319 10*3/uL (ref 145–400)
RBC: 4.74 10*6/uL (ref 3.70–5.32)
RDW: 12.8 % (ref 11.1–15.7)
WBC: 8.5 10*3/uL (ref 3.9–10.0)

## 2014-04-04 ENCOUNTER — Other Ambulatory Visit: Payer: BC Managed Care – PPO | Admitting: Lab

## 2014-04-04 ENCOUNTER — Encounter: Payer: Self-pay | Admitting: Hematology & Oncology

## 2014-04-04 ENCOUNTER — Ambulatory Visit (HOSPITAL_BASED_OUTPATIENT_CLINIC_OR_DEPARTMENT_OTHER): Payer: BC Managed Care – PPO | Admitting: Hematology & Oncology

## 2014-04-04 ENCOUNTER — Ambulatory Visit: Payer: BC Managed Care – PPO

## 2014-04-04 VITALS — BP 153/83 | HR 62 | Temp 97.4°F | Resp 14 | Ht 67.0 in | Wt 239.0 lb

## 2014-04-04 DIAGNOSIS — I824Z1 Acute embolism and thrombosis of unspecified deep veins of right distal lower extremity: Secondary | ICD-10-CM

## 2014-04-04 DIAGNOSIS — I82491 Acute embolism and thrombosis of other specified deep vein of right lower extremity: Secondary | ICD-10-CM

## 2014-04-04 HISTORY — DX: Acute embolism and thrombosis of other specified deep vein of right lower extremity: I82.491

## 2014-04-04 NOTE — Progress Notes (Signed)
Referral MD  Reason for Referral: DVT of the right lower leg-peroneal vein   Chief Complaint  Patient presents with  . NEW PATIENT  : Have a blood clot in my right leg  HPI: Kim Fox is a very nice 48 year old white female. She has history of polycystic kidney disease. She's had no issues with kidney insufficiency. She has hypertension.  She's not on any kind of long trips. She's had no problems with smoking. There's been no recent surgery.  She works without difficulty. He does a Network engineer job.  She began to note some pain and swelling in the right lower leg. This is probably about 12 days ago. It became difficult for her to prop the leg up. Became more difficult to test the back of the right leg.  She finally had a Doppler done. This showed a thromboembolic event in the right peroneal vein.  She saw her family doctor, Dr. Coralyn Mark. She Ms. Manternach on Xarelto.  Ms. Shek has had hypercoagulable studies done 2 days ago. We still do not have the results back.  Her right leg feels a lot better. Is not swollen. She's able to get around better.  She's had 2 children. She's been on no oral contraceptives for several years. She no problems with her pregnancies.  Is no family history of clots. She says her mom had phlebitis in her legs.  She's had no weight loss weight gain. There is no rashes. She's had no change in bowel or bladder habits.. She has her mammograms yearly.  We were asked to see her to recommend management recommendations for the thromboembolic event.            Past Medical History  Diagnosis Date  . Hypertension   . Headache(784.0)     migraine  . GERD (gastroesophageal reflux disease)   . Polycystic kidney disease   . Obesity   . Menorrhagia   . Murmur, cardiac     Echo WNL 2009  :  Past Surgical History  Procedure Laterality Date  . Tonsillectomy    . Ureteral stent placement  2002  . Kidney cyst removal  2002  . Endometrial biopsy  2010   :  Current outpatient prescriptions:atenolol (TENORMIN) 50 MG tablet, Take 1 tablet (50 mg total) by mouth daily., Disp: 90 tablet, Rfl: 3;  Biotin 5000 MCG CAPS, Take 1 capsule by mouth daily., Disp: , Rfl: ;  Calcium Carbonate-Vitamin D (CALCIUM-VITAMIN D) 600-200 MG-UNIT CAPS, Take 1 tablet by mouth 2 (two) times daily., Disp: , Rfl: ;  L-Lysine 1000 MG TABS, Take 1 tablet by mouth daily., Disp: , Rfl:  Multiple Vitamin (MULTIVITAMIN) tablet, Take 1 tablet by mouth daily., Disp: , Rfl: ;  nabumetone (RELAFEN) 500 MG tablet, Take 1 tablet (500 mg total) by mouth 2 (two) times daily as needed., Disp: 30 tablet, Rfl: 1;  SUMAtriptan (IMITREX) 100 MG tablet, Take 100 mg by mouth. Take at the onset of HA.  May repeat in 2 hours if needed.  Max dose 2/24 hours, 4/week , Disp: , Rfl:  XARELTO STARTER PACK 15 & 20 MG TBPK, Take 15-20 mg by mouth as directed. Take as directed on package: Start with one 15mg  tablet by mouth twice a day with food. On Day 22, switch to one 20mg  tablet once a day with food., Disp: 51 each, Rfl: 0:  :  Allergies  Allergen Reactions  . Macrodantin Rash  . Penicillins Rash  :  Family History  Problem Relation Age  of Onset  . Diabetes Mother   . Hypertension Mother   . Migraines Mother   . Varicose Veins Mother   . Diabetes Father   . Hypertension Father   . Migraines Sister   . Cancer Paternal Grandmother     lung  . Cancer Paternal Grandfather     bladder, prostate  . Heart disease Paternal Grandfather   :  History   Social History  . Marital Status: Divorced    Spouse Name: N/A    Number of Children: 2  . 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 Smoking status: Never Smoker   . Smokeless tobacco: Never Used     Comment: never used tobacco  . Alcohol Use: 0.5 oz/week    1 drink(s) per week  . Drug Use: No  . Sexual Activity: Not Currently   Other Topics  Concern  . Not on file   Social History Narrative  . No narrative on file  :  Pertinent items are noted in HPI.  Exam: @IPVITALS @  obese white female in no obvious distress. Vital signs show temperature of 97.4. Pulse 60. Blood pressure 153/83. Weight is 239 pounds. Head and neck exam shows no ocular or oral lesions. No palpable cervical or supraclavicular lymph nodes. Lungs are clear. Cardiac exam regular with no murmurs, rubs or bruits. Abdomen is soft. She is obese. She is good bowel sounds. There is no palpable liver or spleen tip. Back exam shows no tenderness over the spine, ribs or hips. Extremities shows left leg with no masses, edema or erythema. She has no venous cord in the left leg. Right leg shows no palpable venous cord in the lower leg. There is minimal swelling noted in the right lower leg. She has a negative Homans sign in the right leg. Neurological exam is nonfocal. Skin exam shows no rashes, ecchymosis or petechia.    Recent Labs  04/02/14 1208  WBC 8.5  HGB 14.2  HCT 41.6  PLT 319   No results found for this basename: NA, K, CL, CO2, GLUCOSE, BUN, CREATININE, CALCIUM,  in the last 72 hours  Blood smear review: None  Pathology: None     Assessment and Plan: Kim Fox is a 48 year old white female. She still has her monthly cycles.. She was on oral contraceptives. She has a right peroneal vein thrombus.  I think that we should probably keep her on for 3 months. We will await the results of her hypercoagulable studies. It is possible that she may have hypercoagulable state. If she does, then her daughter will have to be checked. Her daughter is on oral contraceptives.  I spent about 45 minutes with her. I answered her questions.  I want to get another Doppler of her right leg in 6 weeks we'll see her back.  Again, I think we are probably go for 3 months of anticoagulation.  I will call in her Xarelto prescription.

## 2014-04-05 ENCOUNTER — Telehealth: Payer: Self-pay | Admitting: Hematology & Oncology

## 2014-04-05 NOTE — Telephone Encounter (Signed)
Left pt message to call for the details of 11-25 appointment. I also mailed schedule

## 2014-04-05 NOTE — Telephone Encounter (Signed)
Pt moved 11-25 to 12-4

## 2014-04-09 LAB — HYPERCOAGULABLE PANEL, COMPREHENSIVE
ANTICARDIOLIPIN IGA: 10 U/mL (ref <22)
AntiThromb III Func: 99 % (ref 76–126)
Anticardiolipin IgG: 11 GPL U/mL (ref <23)
Anticardiolipin IgM: 7 MPL U/mL (ref <11)
BETA-2-GLYCOPROTEIN I IGA: 7 A Units (ref <20)
BETA-2-GLYCOPROTEIN I IGM: 10 M Units (ref <20)
Beta-2 Glyco I IgG: 0 G Units (ref <20)
DRVVT 1:1 Mix: 44.4 secs — ABNORMAL HIGH (ref <42.9)
DRVVT: 54.9 secs — ABNORMAL HIGH (ref <42.9)
Drvvt confirmation: 1.13 Ratio (ref <1.15)
Lupus Anticoagulant: NOT DETECTED
PROTEIN C, TOTAL: 85 % (ref 72–160)
PROTEIN S ACTIVITY: 152 % — AB (ref 69–129)
PROTEIN S TOTAL: 118 % (ref 60–150)
PTT Lupus Anticoagulant: 44.9 secs — ABNORMAL HIGH (ref 28.0–43.0)
PTTLA 41 MIX: 45.9 s — AB (ref 28.0–43.0)
PTTLA Confirmation: 6.4 secs (ref <8.0)
Protein C Activity: 179 % — ABNORMAL HIGH (ref 75–133)

## 2014-04-09 LAB — D-DIMER, QUANTITATIVE: D-Dimer, Quant: 1 ug/mL-FEU — ABNORMAL HIGH (ref 0.00–0.48)

## 2014-04-11 ENCOUNTER — Telehealth: Payer: Self-pay | Admitting: *Deleted

## 2014-04-11 NOTE — Telephone Encounter (Signed)
Called patient with lab results and relayed message from Dr Marin Olp - hypercoag studies are normal!!!! Please make sure that she also is taking Folic acid 1 mg a day. Pete. She currently is taking a multivitamin and will check the formulary. If folic acid is not within the MVI she will add to her regimen.

## 2014-04-15 ENCOUNTER — Other Ambulatory Visit: Payer: Self-pay | Admitting: *Deleted

## 2014-04-15 DIAGNOSIS — I82491 Acute embolism and thrombosis of other specified deep vein of right lower extremity: Secondary | ICD-10-CM

## 2014-04-15 MED ORDER — RIVAROXABAN 20 MG PO TABS: 20.0000 mg | ORAL_TABLET | Freq: Every day | ORAL | Status: DC

## 2014-04-18 ENCOUNTER — Ambulatory Visit (INDEPENDENT_AMBULATORY_CARE_PROVIDER_SITE_OTHER): Payer: BC Managed Care – PPO | Admitting: *Deleted

## 2014-04-18 DIAGNOSIS — Z23 Encounter for immunization: Secondary | ICD-10-CM

## 2014-04-22 ENCOUNTER — Encounter: Payer: Self-pay | Admitting: Hematology & Oncology

## 2014-05-15 ENCOUNTER — Other Ambulatory Visit: Payer: BC Managed Care – PPO | Admitting: Lab

## 2014-05-15 ENCOUNTER — Other Ambulatory Visit (HOSPITAL_BASED_OUTPATIENT_CLINIC_OR_DEPARTMENT_OTHER): Payer: BC Managed Care – PPO

## 2014-05-15 ENCOUNTER — Ambulatory Visit: Payer: BC Managed Care – PPO | Admitting: Hematology & Oncology

## 2014-05-22 ENCOUNTER — Telehealth: Payer: Self-pay

## 2014-05-22 NOTE — Telephone Encounter (Signed)
Called to check with Kim Fox about her insurance and paying for her and [place] CPE, since there where in April last year and March this year.

## 2014-05-23 ENCOUNTER — Other Ambulatory Visit: Payer: Self-pay | Admitting: *Deleted

## 2014-05-23 DIAGNOSIS — I82491 Acute embolism and thrombosis of other specified deep vein of right lower extremity: Secondary | ICD-10-CM

## 2014-05-23 NOTE — Telephone Encounter (Signed)
Kim Fox called me back and she called her insurance company and they said that as long as they have one in a calendar year they are fine.

## 2014-05-24 ENCOUNTER — Ambulatory Visit (HOSPITAL_BASED_OUTPATIENT_CLINIC_OR_DEPARTMENT_OTHER): Payer: BC Managed Care – PPO | Admitting: Family

## 2014-05-24 ENCOUNTER — Other Ambulatory Visit (HOSPITAL_BASED_OUTPATIENT_CLINIC_OR_DEPARTMENT_OTHER): Payer: BC Managed Care – PPO | Admitting: Lab

## 2014-05-24 ENCOUNTER — Ambulatory Visit (HOSPITAL_BASED_OUTPATIENT_CLINIC_OR_DEPARTMENT_OTHER)
Admission: RE | Admit: 2014-05-24 | Discharge: 2014-05-24 | Disposition: A | Discharge status: Home / Self Care | Payer: BC Managed Care – PPO | Source: Ambulatory Visit | Attending: Hematology & Oncology | Admitting: Hematology & Oncology

## 2014-05-24 ENCOUNTER — Encounter: Payer: Self-pay | Admitting: Family

## 2014-05-24 VITALS — BP 144/71 | HR 60 | Temp 98.3°F | Resp 14 | Ht 67.0 in | Wt 245.0 lb

## 2014-05-24 DIAGNOSIS — I82491 Acute embolism and thrombosis of other specified deep vein of right lower extremity: Secondary | ICD-10-CM

## 2014-05-24 DIAGNOSIS — I824Z1 Acute embolism and thrombosis of unspecified deep veins of right distal lower extremity: Secondary | ICD-10-CM | POA: Diagnosis not present

## 2014-05-24 DIAGNOSIS — I82401 Acute embolism and thrombosis of unspecified deep veins of right lower extremity: Secondary | ICD-10-CM

## 2014-05-24 LAB — CBC WITH DIFFERENTIAL (CANCER CENTER ONLY)
BASO#: 0.1 10*3/uL (ref 0.0–0.2)
BASO%: 0.9 % (ref 0.0–2.0)
EOS ABS: 0.2 10*3/uL (ref 0.0–0.5)
EOS%: 1.8 % (ref 0.0–7.0)
HCT: 41 % (ref 34.8–46.6)
HGB: 13.6 g/dL (ref 11.6–15.9)
LYMPH#: 3 10*3/uL (ref 0.9–3.3)
LYMPH%: 34.5 % (ref 14.0–48.0)
MCH: 29.4 pg (ref 26.0–34.0)
MCHC: 33.2 g/dL (ref 32.0–36.0)
MCV: 89 fL (ref 81–101)
MONO#: 0.9 10*3/uL (ref 0.1–0.9)
MONO%: 10 % (ref 0.0–13.0)
NEUT%: 52.8 % (ref 39.6–80.0)
NEUTROS ABS: 4.6 10*3/uL (ref 1.5–6.5)
PLATELETS: 252 10*3/uL (ref 145–400)
RBC: 4.62 10*6/uL (ref 3.70–5.32)
RDW: 13 % (ref 11.1–15.7)
WBC: 8.7 10*3/uL (ref 3.9–10.0)

## 2014-05-24 NOTE — Progress Notes (Signed)
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 She is doing well and has no complaints.  She has no swelling or pain in her right leg. Her ultrasound today showed a stable DVT in the right peroneal vein.  She has had no bleeding.  She does not have compression stockings so we will give her a prescription for some today.  She denies fever, chills, n/v, cough, headache, dizziness, SOB, chest pain, palpitations, abdominal pain, constipation, diarrhea, blood in urine or stool.  No swelling, tenderness, numbness or tingling in her extremities.  Her appetite is good and she is drinking lots of fluids. Her weight is up 6 lbs since her last visit.  Her hypercoagulability panel was negative.   CURRENT TREATMENT: Xarelto 20 mg daily  REVIEW OF SYSTEMS: All other 10 point review of systems is negative.   PAST MEDICAL HISTORY: Past Medical History  Diagnosis Date  . Hypertension   . Headache(784.0)     migraine  . GERD (gastroesophageal reflux disease)   . Polycystic kidney disease   . Obesity   . Menorrhagia   . Murmur, cardiac     Echo WNL 2009  . I 04/04/2014    PAST SURGICAL HISTORY: Past Surgical History  Procedure Laterality Date  . Tonsillectomy    . Ureteral stent placement  2002  . Kidney cyst removal  2002  . Endometrial biopsy  2010    FAMILY HISTORY Family History  Problem Relation Age of Onset  . Diabetes Mother   . Hypertension Mother   . Migraines Mother   . Varicose Veins Mother   . Diabetes Father   . Hypertension Father   . Migraines Sister   . Cancer Paternal Grandmother     lung  . Cancer Paternal Grandfather     bladder, prostate  . Heart disease Paternal  Grandfather     GYNECOLOGIC HISTORY:  Patient's last menstrual period was 05/17/2014.   SOCIAL HISTORY: History   Social History  . Marital Status: Divorced    Spouse Name: N/A    Number of Children: 2  . 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 Smoking status: Never Smoker   . Smokeless tobacco: Never Used     Comment: never used tobacco  . Alcohol Use: 0.5 oz/week    1 drink(s) per week  . Drug Use: No  . Sexual Activity: Not Currently   Other Topics Concern  . Not on file   Social History Narrative    ADVANCED DIRECTIVES:  <no information>  HEALTH MAINTENANCE: History  Substance Use Topics  . Smoking status: Never Smoker   . Smokeless tobacco: Never Used     Comment: never used tobacco  . Alcohol Use: 0.5 oz/week    1 drink(s) per week   Colonoscopy: PAP: Bone density: Lipid panel:  Allergies  Allergen Reactions  . Macrodantin Rash  . Penicillins Rash    Current Outpatient Prescriptions  Medication Sig Dispense Refill  . atenolol (TENORMIN) 50 MG tablet Take 1 tablet (50 mg total) by mouth daily. 90 tablet 3  . Biotin 5000 MCG CAPS Take 1 capsule by mouth daily.    Marland Kitchen  Calcium Carbonate-Vitamin D (CALCIUM-VITAMIN D) 600-200 MG-UNIT CAPS Take 1 tablet by mouth 2 (two) times daily.    . folic acid (FOLVITE) 161 MCG tablet Take 400 mcg by mouth daily.    Marland Kitchen L-Lysine 1000 MG TABS Take 1 tablet by mouth daily.    . Multiple Vitamin (MULTIVITAMIN) tablet Take 1 tablet by mouth daily.    . nabumetone (RELAFEN) 500 MG tablet Take 1 tablet (500 mg total) by mouth 2 (two) times daily as needed. 30 tablet 1  . rivaroxaban (XARELTO) 20 MG TABS tablet Take 1 tablet (20 mg total) by mouth daily with supper. 30 tablet 3  . SUMAtriptan (IMITREX) 100 MG tablet Take 100 mg by mouth. Take at the onset of HA.  May repeat in 2 hours if needed.  Max dose 2/24 hours, 4/week      No  current facility-administered medications for this visit.    OBJECTIVE: Filed Vitals:   05/24/14 1144  BP: 144/71  Pulse: 60  Temp: 98.3 F (36.8 C)  Resp: 14    Filed Weights   05/24/14 1144  Weight: 245 lb (111.131 kg)   ECOG FS:0 - Asymptomatic Ocular: Sclerae unicteric, pupils equal, round and reactive to light Ear-nose-throat: Oropharynx clear, dentition fair Lymphatic: No cervical or supraclavicular adenopathy Lungs no rales or rhonchi, good excursion bilaterally Heart regular rate and rhythm, no murmur appreciated Abd soft, nontender, positive bowel sounds MSK no focal spinal tenderness, no joint edema Neuro: non-focal, well-oriented, appropriate affect Breasts: Deferred  LAB RESULTS: CMP     Component Value Date/Time   NA 139 03/25/2014 1230   K 4.5 03/25/2014 1230   CL 103 03/25/2014 1230   CO2 23 03/25/2014 1230   GLUCOSE 93 03/25/2014 1230   BUN 21 03/25/2014 1230   CREATININE 0.80 03/25/2014 1230   CREATININE 0.80 10/05/2013 0838   CALCIUM 9.9 03/25/2014 1230   PROT 7.1 10/05/2013 0838   ALBUMIN 3.6 10/05/2013 0838   AST 19 10/05/2013 0838   ALT 16 10/05/2013 0838   ALKPHOS 53 10/05/2013 0838   BILITOT 0.5 10/05/2013 0838   GFRNONAA 87* 03/25/2014 1230   GFRAA >90 03/25/2014 1230   INo results found for: SPEP, UPEP Lab Results  Component Value Date   WBC 8.5 04/02/2014   NEUTROABS 4.6 04/02/2014   HGB 14.2 04/02/2014   HCT 41.6 04/02/2014   MCV 88 04/02/2014   PLT 319 04/02/2014   No results found for: LABCA2 No components found for: LABCA125 No results for input(s): INR in the last 168 hours.  STUDIES: US Venous Img Lower Unilateral Right  05/24/2014   CLINICAL DATA:  Previously noted segmental thrombus in a peroneal vein branch  EXAM: RIGHT LOWER EXTREMITY VENOUS DUPLEX ULTRASOUND  TECHNIQUE: Gray-scale sonography with graded compression, as well as color Doppler and duplex ultrasound were performed to evaluate the right lower extremity  deep venous system from the level of the common femoral vein and including the common femoral, femoral, profunda femoral, popliteal and calf veins including the posterior tibial, peroneal and gastrocnemius veins when visible. The superficial great saphenous vein was also interrogated. Spectral Doppler was utilized to evaluate flow at rest and with distal augmentation maneuvers in the common femoral, femoral and popliteal veins. Contralateral common femoral vein also interrogated.  COMPARISON:  March 28, 2014  FINDINGS: Contralateral Common Femoral Vein: Respiratory phasicity is normal and symmetric with the symptomatic side. No evidence of thrombus. Normal compressibility.  Common Femoral Vein: No evidence of thrombus. Normal compressibility,  respiratory phasicity and response to augmentation.  Saphenofemoral Junction: No evidence of thrombus. Normal compressibility and flow on color Doppler imaging.  Profunda Femoral Vein: No evidence of thrombus. Normal compressibility and flow on color Doppler imaging.  Femoral Vein: No evidence of thrombus. Normal compressibility, respiratory phasicity and response to augmentation.  Popliteal Vein: No evidence of thrombus. Normal compressibility, respiratory phasicity and response to augmentation.  Calf Veins: There is again noted thrombus in a superior calf segment of the peroneal vein. There is loss of compression and augmentation this vessel focally. There is loss of Doppler signal in this region. The remainder of the peroneal vein is patent. The other calf veins are patent.  Superficial Great Saphenous Vein: No evidence of thrombus. Normal compressibility and flow on color Doppler imaging.  Venous Reflux:  None.  Other Findings:  None.  IMPRESSION: Stable segmental deep venous thrombus in the peroneal vein in the superior calf region. No appreciable progression of thrombus compared to prior study. No new thrombi identified. Study otherwise unremarkable.   Electronically  Signed   By: Lowella Grip M.D.   On: 05/24/2014 10:49   ASSESSMENT/PLAN: Kim Fox is here today for a follow-up. She is doing well and has no complaints.  She has no swelling or pain in her right leg. Her ultrasound today showed a stable DVT in the right peroneal vein.  She has had no bleeding.  Her CBC today was good.  She has a prescription for her compression stocking.  We will see her back in 1 months for labs, ultrasound and follow-up.  She knows to call here with any questions or concerns and to go to the ED in the event of an emergency. We can certainly see her sooner.   Eliezer Bottom, NP 05/24/2014 1:14 PM

## 2014-05-27 ENCOUNTER — Telehealth: Payer: Self-pay | Admitting: Hematology & Oncology

## 2014-05-27 NOTE — Telephone Encounter (Signed)
Left pt message to call for details of 12-31 appointments

## 2014-05-27 NOTE — Telephone Encounter (Signed)
Pt moved appointments to 1-12 due to work and vacation schedule

## 2014-06-20 ENCOUNTER — Other Ambulatory Visit: Payer: BC Managed Care – PPO | Admitting: Lab

## 2014-06-20 ENCOUNTER — Ambulatory Visit: Payer: BC Managed Care – PPO | Admitting: Hematology & Oncology

## 2014-06-20 ENCOUNTER — Other Ambulatory Visit (HOSPITAL_BASED_OUTPATIENT_CLINIC_OR_DEPARTMENT_OTHER): Payer: BC Managed Care – PPO

## 2014-06-21 HISTORY — PX: ENDOMETRIAL ABLATION: SHX621

## 2014-07-02 ENCOUNTER — Encounter: Payer: Self-pay | Admitting: Hematology & Oncology

## 2014-07-02 ENCOUNTER — Other Ambulatory Visit (HOSPITAL_BASED_OUTPATIENT_CLINIC_OR_DEPARTMENT_OTHER): Payer: BLUE CROSS/BLUE SHIELD | Admitting: Lab

## 2014-07-02 ENCOUNTER — Ambulatory Visit (HOSPITAL_BASED_OUTPATIENT_CLINIC_OR_DEPARTMENT_OTHER): Payer: BLUE CROSS/BLUE SHIELD | Admitting: Hematology & Oncology

## 2014-07-02 ENCOUNTER — Ambulatory Visit (HOSPITAL_BASED_OUTPATIENT_CLINIC_OR_DEPARTMENT_OTHER)
Admission: RE | Admit: 2014-07-02 | Discharge: 2014-07-02 | Disposition: A | Discharge status: Home / Self Care | Payer: BLUE CROSS/BLUE SHIELD | Source: Ambulatory Visit | Attending: Family | Admitting: Family

## 2014-07-02 VITALS — BP 131/85 | HR 57 | Temp 98.1°F | Resp 14 | Ht 67.0 in | Wt 248.0 lb

## 2014-07-02 DIAGNOSIS — I82401 Acute embolism and thrombosis of unspecified deep veins of right lower extremity: Secondary | ICD-10-CM

## 2014-07-02 DIAGNOSIS — I824Z1 Acute embolism and thrombosis of unspecified deep veins of right distal lower extremity: Secondary | ICD-10-CM | POA: Insufficient documentation

## 2014-07-02 DIAGNOSIS — I82491 Acute embolism and thrombosis of other specified deep vein of right lower extremity: Secondary | ICD-10-CM

## 2014-07-02 LAB — CBC WITH DIFFERENTIAL (CANCER CENTER ONLY)
BASO#: 0.1 10*3/uL (ref 0.0–0.2)
BASO%: 0.8 % (ref 0.0–2.0)
EOS ABS: 0.1 10*3/uL (ref 0.0–0.5)
EOS%: 1.4 % (ref 0.0–7.0)
HEMATOCRIT: 42.2 % (ref 34.8–46.6)
HEMOGLOBIN: 14 g/dL (ref 11.6–15.9)
LYMPH#: 3 10*3/uL (ref 0.9–3.3)
LYMPH%: 34.4 % (ref 14.0–48.0)
MCH: 29 pg (ref 26.0–34.0)
MCHC: 33.2 g/dL (ref 32.0–36.0)
MCV: 88 fL (ref 81–101)
MONO#: 0.9 10*3/uL (ref 0.1–0.9)
MONO%: 10.1 % (ref 0.0–13.0)
NEUT%: 53.3 % (ref 39.6–80.0)
NEUTROS ABS: 4.7 10*3/uL (ref 1.5–6.5)
Platelets: 282 10*3/uL (ref 145–400)
RBC: 4.82 10*6/uL (ref 3.70–5.32)
RDW: 13.7 % (ref 11.1–15.7)
WBC: 8.8 10*3/uL (ref 3.9–10.0)

## 2014-07-02 NOTE — Progress Notes (Signed)
Hematology and Oncology Follow Up Visit  Kim Fox [id number] Jul 29, 1965 49 y.o. 07/02/2014   Principle Diagnosis:   Right peroneal vein thrombus  Current Therapy:    Xarelto 20 mg by mouth daily-to finish April 2016     Interim History:  [name] is back for follow-up. She had a good Christmas. She and her family were down in [place]. She had a good time. Patient no problems with the right leg. She has a compression stocking that she wears. She did not have any, swelling or pain with the right leg.  We did go ahead and do a Doppler of her leg today. This shows a persistent thrombus in the right peroneal vein. This is stable compared to her last Doppler.  She's had no cough or shortness of breath. She's had no nausea or vomiting. She's had no bleeding.  She said that her monthly cycles are quite erratic.  Medications: Current outpatient prescriptions: atenolol (TENORMIN) 50 MG tablet, Take 1 tablet (50 mg total) by mouth daily., Disp: 90 tablet, Rfl: 3;  Biotin 5000 MCG CAPS, Take 1 capsule by mouth daily., Disp: , Rfl: ;  Calcium Carbonate-Vitamin D (CALCIUM-VITAMIN D) 600-200 MG-UNIT CAPS, Take 1 tablet by mouth 2 (two) times daily., Disp: , Rfl: ;  folic acid (FOLVITE) 403 MCG tablet, Take 400 mcg by mouth daily., Disp: , Rfl:  L-Lysine 1000 MG TABS, Take 1 tablet by mouth daily., Disp: , Rfl: ;  Multiple Vitamin (MULTIVITAMIN) tablet, Take 1 tablet by mouth daily., Disp: , Rfl: ;  nabumetone (RELAFEN) 500 MG tablet, Take 1 tablet (500 mg total) by mouth 2 (two) times daily as needed., Disp: 30 tablet, Rfl: 1;  rivaroxaban (XARELTO) 20 MG TABS tablet, Take 1 tablet (20 mg total) by mouth daily with supper., Disp: 30 tablet, Rfl: 3 SUMAtriptan (IMITREX) 100 MG tablet, Take 100 mg by mouth. Take at the onset of HA.  May repeat in 2 hours if needed.  Max dose 2/24 hours, 4/week , Disp: , Rfl:   Allergies:  Allergies  Allergen Reactions  . Macrodantin Rash  . Penicillins  Rash    Past Medical History, Surgical history, Social history, and Family History were reviewed and updated.  Review of Systems: As above  Physical Exam:  height is 5\' 7"  (1.702 m) and weight is 248 lb (112.492 kg). Her oral temperature is 98.1 F (36.7 C). Her blood pressure is 131/85 and her pulse is 57. Her respiration is 14.   Well-developed and well-nourished white female. Head and neck exam shows no ocular or oral lesions. There are no palpable cervical or supraclavicular lymph nodes. Lungs are clear. Cardiac exam regular rate and rhythm with no murmurs, rubs or bruits. Abdomen is soft. She is good bowel sounds. There is no fluid wave. There is no palpable liver or spleen tip. Back exam shows no tenderness over the spine, ribs or hips. Extremities shows no clubbing, cyanosis or edema. No venous cords noted in the legs. She is a negative Homans's sign. Skin exam shows no rashes, ecchymoses or petechia.  Lab Results  Component Value Date   WBC 8.8 07/02/2014   HGB 14.0 07/02/2014   HCT 42.2 07/02/2014   MCV 88 07/02/2014   PLT 282 07/02/2014     Chemistry      Component Value Date/Time   NA 139 03/25/2014 1230   K 4.5 03/25/2014 1230   CL 103 03/25/2014 1230   CO2 23 03/25/2014 1230   BUN  21 03/25/2014 1230   CREATININE 0.80 03/25/2014 1230   CREATININE 0.80 10/05/2013 0838      Component Value Date/Time   CALCIUM 9.9 03/25/2014 1230   ALKPHOS 53 10/05/2013 0838   AST 19 10/05/2013 0838   ALT 16 10/05/2013 0838   BILITOT 0.5 10/05/2013 0838         Impression and Plan: Kim Fox is 49 year old white female with a right peroneal vein thrombus. She has a normal hypercoagulable panel.  I think that with a persistent thrombus, we will have to keep her on Xarelto for right now. I think 6 months of Xarelto would be appropriate.  After 6 months, I will repeat her Doppler. At the thrombus is still present, I will probably have her on low-dose Xarelto at 5 mg a  day.  I want to see her back in 3 months.   Volanda Napoleon, MD 1/12/201612:39 PM

## 2014-07-03 ENCOUNTER — Telehealth: Payer: Self-pay | Admitting: Hematology & Oncology

## 2014-07-03 LAB — D-DIMER, QUANTITATIVE (NOT AT ARMC): D-Dimer, Quant: 0.27 ug/mL-FEU (ref 0.00–0.48)

## 2014-07-03 NOTE — Telephone Encounter (Signed)
Left pt message to call for 4-14 appointment

## 2014-07-03 NOTE — Telephone Encounter (Signed)
Mailed April schedule

## 2014-08-16 ENCOUNTER — Other Ambulatory Visit: Payer: Self-pay | Admitting: Hematology & Oncology

## 2014-08-29 ENCOUNTER — Ambulatory Visit (INDEPENDENT_AMBULATORY_CARE_PROVIDER_SITE_OTHER): Payer: BLUE CROSS/BLUE SHIELD | Admitting: Internal Medicine

## 2014-08-29 ENCOUNTER — Encounter: Payer: Self-pay | Admitting: Internal Medicine

## 2014-08-29 VITALS — BP 132/81 | HR 53 | Resp 16 | Ht 67.0 in | Wt 241.0 lb

## 2014-08-29 DIAGNOSIS — I10 Essential (primary) hypertension: Secondary | ICD-10-CM

## 2014-08-29 DIAGNOSIS — G43809 Other migraine, not intractable, without status migrainosus: Secondary | ICD-10-CM | POA: Diagnosis not present

## 2014-08-29 DIAGNOSIS — Z0000 Encounter for general adult medical examination without abnormal findings: Secondary | ICD-10-CM | POA: Diagnosis not present

## 2014-08-29 DIAGNOSIS — I82401 Acute embolism and thrombosis of unspecified deep veins of right lower extremity: Secondary | ICD-10-CM | POA: Diagnosis not present

## 2014-08-29 DIAGNOSIS — E669 Obesity, unspecified: Secondary | ICD-10-CM

## 2014-08-29 LAB — POCT URINALYSIS DIPSTICK
BILIRUBIN UA: NEGATIVE
Glucose, UA: NEGATIVE
Ketones, UA: NEGATIVE
Leukocytes, UA: NEGATIVE
Nitrite, UA: NEGATIVE
PH UA: 6.5
Protein, UA: NEGATIVE
RBC UA: NEGATIVE
SPEC GRAV UA: 1.01
Urobilinogen, UA: NEGATIVE

## 2014-08-29 LAB — COMPLETE METABOLIC PANEL WITH GFR
ALT: 13 U/L (ref 0–35)
AST: 19 U/L (ref 0–37)
Albumin: 3.6 g/dL (ref 3.5–5.2)
Alkaline Phosphatase: 67 U/L (ref 39–117)
BUN: 19 mg/dL (ref 6–23)
CO2: 27 mEq/L (ref 19–32)
CREATININE: 0.79 mg/dL (ref 0.50–1.10)
Calcium: 9.2 mg/dL (ref 8.4–10.5)
Chloride: 102 mEq/L (ref 96–112)
GFR, Est Non African American: 89 mL/min
Glucose, Bld: 83 mg/dL (ref 70–99)
Potassium: 4.2 mEq/L (ref 3.5–5.3)
SODIUM: 137 meq/L (ref 135–145)
Total Bilirubin: 0.4 mg/dL (ref 0.2–1.2)
Total Protein: 6.8 g/dL (ref 6.0–8.3)

## 2014-08-29 LAB — CBC WITH DIFFERENTIAL/PLATELET
BASOS PCT: 1 % (ref 0–1)
Basophils Absolute: 0.1 10*3/uL (ref 0.0–0.1)
EOS ABS: 0.1 10*3/uL (ref 0.0–0.7)
Eosinophils Relative: 2 % (ref 0–5)
HCT: 42.3 % (ref 36.0–46.0)
HEMOGLOBIN: 14.2 g/dL (ref 12.0–15.0)
Lymphocytes Relative: 42 % (ref 12–46)
Lymphs Abs: 3 10*3/uL (ref 0.7–4.0)
MCH: 29.1 pg (ref 26.0–34.0)
MCHC: 33.6 g/dL (ref 30.0–36.0)
MCV: 86.7 fL (ref 78.0–100.0)
MONOS PCT: 9 % (ref 3–12)
MPV: 11.6 fL (ref 8.6–12.4)
Monocytes Absolute: 0.6 10*3/uL (ref 0.1–1.0)
Neutro Abs: 3.3 10*3/uL (ref 1.7–7.7)
Neutrophils Relative %: 46 % (ref 43–77)
PLATELETS: 301 10*3/uL (ref 150–400)
RBC: 4.88 MIL/uL (ref 3.87–5.11)
RDW: 14.2 % (ref 11.5–15.5)
WBC: 7.1 10*3/uL (ref 4.0–10.5)

## 2014-08-29 LAB — LIPID PANEL
Cholesterol: 145 mg/dL (ref 0–200)
HDL: 43 mg/dL — AB (ref >=46)
LDL Cholesterol: 82 mg/dL (ref 0–99)
TRIGLYCERIDES: 101 mg/dL (ref <150)
Total CHOL/HDL Ratio: 3.4 Ratio
VLDL: 20 mg/dL (ref 0–40)

## 2014-08-29 LAB — TSH: TSH: 0.908 u[IU]/mL (ref 0.350–4.500)

## 2014-08-29 MED ORDER — ATENOLOL 50 MG PO TABS: 50.0000 mg | ORAL_TABLET | Freq: Every day | ORAL | Status: DC

## 2014-08-29 MED ORDER — SUMATRIPTAN SUCCINATE 100 MG PO TABS: ORAL_TABLET | ORAL | Status: DC

## 2014-08-29 NOTE — Progress Notes (Signed)
Subjective:    Patient ID: Kim Fox, female    DOB: 25-Aug-1965, 49 y.o.   MRN: [id number]  HPI 05/24/2014 hematology note STUDIES:  Imaging Results    US Venous Img Lower Unilateral Right  05/24/2014 CLINICAL DATA: Previously noted segmental thrombus in a peroneal vein branch EXAM: RIGHT LOWER EXTREMITY VENOUS DUPLEX ULTRASOUND TECHNIQUE: Gray-scale sonography with graded compression, as well as color Doppler and duplex ultrasound were performed to evaluate the right lower extremity deep venous system from the level of the common femoral vein and including the common femoral, femoral, profunda femoral, popliteal and calf veins including the posterior tibial, peroneal and gastrocnemius veins when visible. The superficial great saphenous vein was also interrogated. Spectral Doppler was utilized to evaluate flow at rest and with distal augmentation maneuvers in the common femoral, femoral and popliteal veins. Contralateral common femoral vein also interrogated. COMPARISON: March 28, 2014 FINDINGS: Contralateral Common Femoral Vein: Respiratory phasicity is normal and symmetric with the symptomatic side. No evidence of thrombus. Normal compressibility. Common Femoral Vein: No evidence of thrombus. Normal compressibility, respiratory phasicity and response to augmentation. Saphenofemoral Junction: No evidence of thrombus. Normal compressibility and flow on color Doppler imaging. Profunda Femoral Vein: No evidence of thrombus. Normal compressibility and flow on color Doppler imaging. Femoral Vein: No evidence of thrombus. Normal compressibility, respiratory phasicity and response to augmentation. Popliteal Vein: No evidence of thrombus. Normal compressibility, respiratory phasicity and response to augmentation. Calf Veins: There is again noted thrombus in a superior calf segment of the peroneal vein. There is loss of compression and augmentation this vessel focally. There is loss of  Doppler signal in this region. The remainder of the peroneal vein is patent. The other calf veins are patent. Superficial Great Saphenous Vein: No evidence of thrombus. Normal compressibility and flow on color Doppler imaging. Venous Reflux: None. Other Findings: None. IMPRESSION: Stable segmental deep venous thrombus in the peroneal vein in the superior calf region. No appreciable progression of thrombus compared to prior study. No new thrombi identified. Study otherwise unremarkable. Electronically Signed By: Lowella Grip M.D. On: 05/24/2014 10:49    ASSESSMENT/PLAN: Kim Fox is here today for a follow-up. She is doing well and has no complaints.  She has no swelling or pain in her right leg. Her ultrasound today showed a stable DVT in the right peroneal vein.  She has had no bleeding.  Her CBC today was good.  She has a prescription for her compression stocking.  We will see her back in 1 months for labs, ultrasound and follow-up.  She knows to call here with any questions or concerns and to go to the ED in the event of an emergency. We can certainly see her sooner.   Eliezer Bottom, NP 05/24/2014 1:14 PM       10/11/2013 my note Assessment & Plan:  Health Maintenance Pap today Will get mm in am See scanned shhet  HTN Continue meds  DUB Pt has been seen by [facility] in the past . She will call for appt  Elbow tendinitis: OK for short course of Relafen. 1 week If persists we discussed referral to sport medicine  Foot callus Ok to see podiatry Number given       TODAY:  Kim Fox is here for CPE  HM   She is due for mm inApril    Pap due 2018  She is a non-smoker  HTN    Tolerating beta blocker well  Migraine controlled with Imitrex  DVT  On Xarelto  Recent U/S shows clot still present  .  Will need longer course of Xarelto  Menorrhagia  Pt notes heaveier menses since on Xarelto.      H/O  PCKD  Creatinines have always been  stable  Allergies  Allergen Reactions  . Macrodantin Rash  . Penicillins Rash   Past Medical History  Diagnosis Date  . Hypertension   . Headache(784.0)     migraine  . GERD (gastroesophageal reflux disease)   . Polycystic kidney disease   . Obesity   . Menorrhagia   . Murmur, cardiac     Echo WNL 2009  . I 04/04/2014   Past Surgical History  Procedure Laterality Date  . Tonsillectomy    . Ureteral stent placement  2002  . Kidney cyst removal  2002  . Endometrial biopsy  2010   History   Social History  . Marital Status: Divorced    Spouse Name: N/A  . Number of Children: 2  . 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 Smoking status: Never Smoker   . Smokeless tobacco: Never Used     Comment: never used tobacco  . Alcohol Use: 0.5 oz/week    1 drink(s) per week  . Drug Use: No  . Sexual Activity: Not Currently   Other Topics Concern  . Not on file   Social History Narrative   Family History  Problem Relation Age of Onset  . Diabetes Mother   . Hypertension Mother   . Migraines Mother   . Varicose Veins Mother   . Diabetes Father   . Hypertension Father   . Migraines Sister   . Cancer Paternal Grandmother     lung  . Cancer Paternal Grandfather     bladder, prostate  . Heart disease Paternal Grandfather    Patient Active Problem List   Diagnosis Date Noted  . Right leg DVT 05/24/2014  . I 04/04/2014  . DUB (dysfunctional uterine bleeding) 10/11/2013  . Essential hypertension, benign 08/09/2011  . GERD (gastroesophageal reflux disease) 08/09/2011  . Migraine 08/09/2011  . Polycystic kidney disease 08/09/2011  . Obesity 08/09/2011  . H/O cardiac murmur 08/09/2011  . Abnormal mammogram 08/09/2011   Current Outpatient Prescriptions on File Prior to Visit  Medication Sig Dispense Refill  . Biotin 5000 MCG CAPS Take 1 capsule by mouth daily.    . Calcium  Carbonate-Vitamin D (CALCIUM-VITAMIN D) 600-200 MG-UNIT CAPS Take 1 tablet by mouth 2 (two) times daily.    . folic acid (FOLVITE) 563 MCG tablet Take 400 mcg by mouth daily.    Marland Kitchen L-Lysine 1000 MG TABS Take 1 tablet by mouth daily.    . Multiple Vitamin (MULTIVITAMIN) tablet Take 1 tablet by mouth daily.    Alveda Reasons 20 MG TABS tablet TAKE 1 TABLET (20 MG) BY MOUTH DAILY WITH SUPPER. 30 tablet 3  . nabumetone (RELAFEN) 500 MG tablet Take 1 tablet (500 mg total) by mouth 2 (two) times daily as needed. (Patient not taking: Reported on 08/29/2014) 30 tablet 1   No current facility-administered medications on file prior to visit.    .    Review of Systems  Respiratory: Negative for cough, choking, chest tightness, shortness of breath, wheezing and stridor.   Cardiovascular: Negative for chest pain, palpitations and leg swelling.  All other systems reviewed and are negative.      Objective:  Physical Exam Physical Exam  Nursing note and vitals reviewed.  Constitutional: She is oriented to person, place, and time. She appears well-developed and well-nourished.  HENT:  Head: Normocephalic and atraumatic.  Right Ear: Tympanic membrane and ear canal normal. No drainage. Tympanic membrane is not injected and not erythematous.  Left Ear: Tympanic membrane and ear canal normal. No drainage. Tympanic membrane is not injected and not erythematous.  Nose: Nose normal. Right sinus exhibits no maxillary sinus tenderness and no frontal sinus tenderness. Left sinus exhibits no maxillary sinus tenderness and no frontal sinus tenderness.  Mouth/Throat: Oropharynx is clear and moist. No oral lesions. No oropharyngeal exudate.  Eyes: Conjunctivae and EOM are normal. Pupils are equal, round, and reactive to light.  Neck: Normal range of motion. Neck supple. No JVD present. Carotid bruit is not present. No mass and no thyromegaly present.  Cardiovascular: Normal rate, regular rhythm, S1 normal, S2 normal and  intact distal pulses. Exam reveals no gallop and no friction rub.  No murmur heard.  Pulses:  Carotid pulses are 2+ on the right side, and 2+ on the left side.  Dorsalis pedis pulses are 2+ on the right side, and 2+ on the left side.  No carotid bruit. No LE edema  Pulmonary/Chest: Breath sounds normal. She has no wheezes. She has no rales. She exhibits no tenderness.  Abdominal: Soft. Bowel sounds are normal. She exhibits no distension and no mass. There is no hepatosplenomegaly. There is no tenderness. There is no CVA tenderness.  Musculoskeletal: Normal range of motion.  No active synovitis to joints.  Lymphadenopathy:  She has no cervical adenopathy.  She has no axillary adenopathy.  Right: No inguinal and no supraclavicular adenopathy present.  Left: No inguinal and no supraclavicular adenopathy present.  Neurological: She is alert and oriented to person, place, and time. She has normal strength and normal reflexes. She displays no tremor. No cranial nerve deficit or sensory deficit. Coordination and gait normal.  Skin: Skin is warm and dry. No rash noted. No cyanosis. Nails show no clubbing.  Psychiatric: She has a normal mood and affect. Her speech is normal and behavior is normal. Cognition and memory are normal.         Assessment & Plan:  HM  Advised 3d mm in April  Non-smoker  See scanned sheet   Htn continue beta blocker  Migraine  Refill Imitrex  DVT  Will need longer course of Xarelto  Managed by hematology  Menorrhagia  Due to anticoagulant .  Will check CBC today  Advised to see GYN and discuss options if long term anticoagulaton necessary   H/O PCKD    Will check chemistries today  See me as needed

## 2014-08-30 ENCOUNTER — Encounter: Payer: Self-pay | Admitting: *Deleted

## 2014-08-30 LAB — VITAMIN D 25 HYDROXY (VIT D DEFICIENCY, FRACTURES): Vit D, 25-Hydroxy: 40 ng/mL (ref 30–100)

## 2014-09-01 ENCOUNTER — Encounter: Payer: Self-pay | Admitting: Internal Medicine

## 2014-09-02 NOTE — Progress Notes (Signed)
A copy of labs mailed to patient -eh

## 2014-09-14 ENCOUNTER — Other Ambulatory Visit: Payer: Self-pay | Admitting: Internal Medicine

## 2014-10-03 ENCOUNTER — Ambulatory Visit (HOSPITAL_BASED_OUTPATIENT_CLINIC_OR_DEPARTMENT_OTHER)
Admission: RE | Admit: 2014-10-03 | Discharge: 2014-10-03 | Disposition: A | Discharge status: Home / Self Care | Payer: BLUE CROSS/BLUE SHIELD | Source: Ambulatory Visit | Attending: Hematology & Oncology | Admitting: Hematology & Oncology

## 2014-10-03 ENCOUNTER — Other Ambulatory Visit (HOSPITAL_BASED_OUTPATIENT_CLINIC_OR_DEPARTMENT_OTHER): Payer: BLUE CROSS/BLUE SHIELD

## 2014-10-03 ENCOUNTER — Encounter: Payer: Self-pay | Admitting: Hematology & Oncology

## 2014-10-03 ENCOUNTER — Other Ambulatory Visit: Payer: Self-pay | Admitting: Internal Medicine

## 2014-10-03 ENCOUNTER — Ambulatory Visit (HOSPITAL_BASED_OUTPATIENT_CLINIC_OR_DEPARTMENT_OTHER): Payer: BLUE CROSS/BLUE SHIELD | Admitting: Hematology & Oncology

## 2014-10-03 VITALS — BP 135/77 | HR 50 | Temp 98.3°F | Resp 14 | Ht 67.0 in | Wt 239.0 lb

## 2014-10-03 DIAGNOSIS — I82401 Acute embolism and thrombosis of unspecified deep veins of right lower extremity: Secondary | ICD-10-CM | POA: Insufficient documentation

## 2014-10-03 DIAGNOSIS — Z1231 Encounter for screening mammogram for malignant neoplasm of breast: Secondary | ICD-10-CM

## 2014-10-03 DIAGNOSIS — I82491 Acute embolism and thrombosis of other specified deep vein of right lower extremity: Secondary | ICD-10-CM

## 2014-10-03 LAB — COMPREHENSIVE METABOLIC PANEL
ALT: 17 U/L (ref 0–35)
AST: 23 U/L (ref 0–37)
Albumin: 3.8 g/dL (ref 3.5–5.2)
Alkaline Phosphatase: 70 U/L (ref 39–117)
BILIRUBIN TOTAL: 0.4 mg/dL (ref 0.2–1.2)
BUN: 22 mg/dL (ref 6–23)
CALCIUM: 9.6 mg/dL (ref 8.4–10.5)
CO2: 28 meq/L (ref 19–32)
Chloride: 103 mEq/L (ref 96–112)
Creatinine, Ser: 0.77 mg/dL (ref 0.50–1.10)
Glucose, Bld: 87 mg/dL (ref 70–99)
Potassium: 4.7 mEq/L (ref 3.5–5.3)
Sodium: 137 mEq/L (ref 135–145)
TOTAL PROTEIN: 7.5 g/dL (ref 6.0–8.3)

## 2014-10-03 LAB — CBC WITH DIFFERENTIAL (CANCER CENTER ONLY)
BASO#: 0.1 10*3/uL (ref 0.0–0.2)
BASO%: 0.9 % (ref 0.0–2.0)
EOS ABS: 0.1 10*3/uL (ref 0.0–0.5)
EOS%: 1.3 % (ref 0.0–7.0)
HEMATOCRIT: 44.3 % (ref 34.8–46.6)
HGB: 14.6 g/dL (ref 11.6–15.9)
LYMPH#: 2.6 10*3/uL (ref 0.9–3.3)
LYMPH%: 34.2 % (ref 14.0–48.0)
MCH: 28.7 pg (ref 26.0–34.0)
MCHC: 33 g/dL (ref 32.0–36.0)
MCV: 87 fL (ref 81–101)
MONO#: 0.7 10*3/uL (ref 0.1–0.9)
MONO%: 9.4 % (ref 0.0–13.0)
NEUT#: 4.1 10*3/uL (ref 1.5–6.5)
NEUT%: 54.2 % (ref 39.6–80.0)
PLATELETS: 266 10*3/uL (ref 145–400)
RBC: 5.08 10*6/uL (ref 3.70–5.32)
RDW: 13.8 % (ref 11.1–15.7)
WBC: 7.7 10*3/uL (ref 3.9–10.0)

## 2014-10-03 LAB — D-DIMER, QUANTITATIVE (NOT AT ARMC): D DIMER QUANT: 0.33 ug{FEU}/mL (ref 0.00–0.48)

## 2014-10-03 MED ORDER — RIVAROXABAN 10 MG PO TABS: 5.0000 mg | ORAL_TABLET | Freq: Every day | ORAL | Status: DC

## 2014-10-03 MED ORDER — RIVAROXABAN 10 MG PO TABS
5.0000 mg | ORAL_TABLET | Freq: Every day | ORAL | Status: DC
Start: 2014-10-03 — End: 2014-10-03

## 2014-10-03 NOTE — Progress Notes (Signed)
Hematology and Oncology Follow Up Visit  Kim Fox [id number] [birth year] 49 y.o. 10/03/2014   Principle Diagnosis:   Right peroneal vein thrombus - chronic  Current Therapy:    Xarelto 20 mg by mouth daily-to finish April 2016  Xarelto 5 mg by mouth daily for maintenance therapy     Interim History:  [name] is back for follow-up. She is still doing well. She's working. She is under a lot of stress at work.  We did go ahead and repeat a Doppler on her. The Doppler showed a chronic thrombus in the right peroneal vein. This was similar to what she had done 3 months ago.  Her monthly cycles are still pretty erratic. She did go see her gynecologist to see about the possibility of endometrial ablation.  She will finish up 6 months of full dose Xarelto in 2 weeks. At that point time, I think that putting her on low dose Xarelto at 5 mg a day would be reasonable.  She's had a good appetite. There's been no nausea or vomiting.  She's had no cough. There's been no chest wall pain. She had no shortness of breath.  She's not noted any pain or swelling in her legs. Medications:  Current outpatient prescriptions:  .  atenolol (TENORMIN) 50 MG tablet, Take 1 tablet (50 mg total) by mouth daily., Disp: 90 tablet, Rfl: 3 .  Biotin 5000 MCG CAPS, Take 1 capsule by mouth daily., Disp: , Rfl:  .  Calcium Carbonate-Vitamin D (CALCIUM-VITAMIN D) 600-200 MG-UNIT CAPS, Take 1 tablet by mouth 2 (two) times daily., Disp: , Rfl:  .  folic acid (FOLVITE) 532 MCG tablet, Take 400 mcg by mouth daily., Disp: , Rfl:  .  L-Lysine 1000 MG TABS, Take 1 tablet by mouth daily., Disp: , Rfl:  .  Multiple Vitamin (MULTIVITAMIN) tablet, Take 1 tablet by mouth daily., Disp: , Rfl:  .  rivaroxaban (XARELTO) 10 MG TABS tablet, Take 0.5 tablets (5 mg total) by mouth daily with supper., Disp: 30 tablet, Rfl: 6 .  SUMAtriptan (IMITREX) 100 MG tablet, Take at the onset of HA.  May repeat in 2 hours if needed.   Max dose 2/24 hours, 4/week, Disp: 10 tablet, Rfl: 3  Allergies:  Allergies  Allergen Reactions  . Macrodantin Rash  . Penicillins Rash    Past Medical History, Surgical history, Social history, and Family History were reviewed and updated.  Review of Systems: As above  Physical Exam:  height is 5\' 7"  (1.702 m) and weight is 239 lb (108.41 kg). Her oral temperature is 98.3 F (36.8 C). Her blood pressure is 135/77 and her pulse is 50. Her respiration is 14.   Well-developed and well-nourished white female. Head and neck exam shows no ocular or oral lesions. There are no palpable cervical or supraclavicular lymph nodes. Lungs are clear. Cardiac exam regular rate and rhythm with no murmurs, rubs or bruits. Abdomen is soft. She is good bowel sounds. There is no fluid wave. There is no palpable liver or spleen tip. Back exam shows no tenderness over the spine, ribs or hips. Extremities shows no clubbing, cyanosis or edema. No venous cords noted in the legs. She is a negative Homans's sign. Skin exam shows no rashes, ecchymoses or petechia.  Lab Results  Component Value Date   WBC 7.7 10/03/2014   HGB 14.6 10/03/2014   HCT 44.3 10/03/2014   MCV 87 10/03/2014   PLT 266 10/03/2014     Chemistry  Component Value Date/Time   NA 137 08/29/2014 1330   K 4.2 08/29/2014 1330   CL 102 08/29/2014 1330   CO2 27 08/29/2014 1330   BUN 19 08/29/2014 1330   CREATININE 0.79 08/29/2014 1330   CREATININE 0.80 03/25/2014 1230      Component Value Date/Time   CALCIUM 9.2 08/29/2014 1330   ALKPHOS 67 08/29/2014 1330   AST 19 08/29/2014 1330   ALT 13 08/29/2014 1330   BILITOT 0.4 08/29/2014 1330         Impression and Plan: [name] is 49 year old white female with a right peroneal vein thrombus. She has a normal hypercoagulable panel.  I think that with a persistent thrombus, we will have to keep her on Xarelto indefinitely. Again, I think that we can do low-dose Xarelto at 5 mg  daily.  In 3 months, I will repeat her Doppler. I want to see if the thrombus stabilizes.  I want to see her back in 3 months.   Kim Napoleon, MD 4/14/20164:40 PM

## 2014-10-15 ENCOUNTER — Ambulatory Visit (HOSPITAL_BASED_OUTPATIENT_CLINIC_OR_DEPARTMENT_OTHER)
Admission: RE | Admit: 2014-10-15 | Discharge: 2014-10-15 | Disposition: A | Discharge status: Home / Self Care | Payer: BLUE CROSS/BLUE SHIELD | Source: Ambulatory Visit | Attending: Internal Medicine | Admitting: Internal Medicine

## 2014-10-15 DIAGNOSIS — Z1231 Encounter for screening mammogram for malignant neoplasm of breast: Secondary | ICD-10-CM | POA: Diagnosis present

## 2014-10-31 NOTE — Patient Instructions (Signed)
   Your procedure is scheduled on:  Friday, May 20  Enter through the Main Entrance of [facility] at: 10:30 AM Pick up the phone at the desk and dial [phone] and inform us of your arrival.  Please call this number if you have any problems the morning of surgery: [phone]  Remember: Do not eat food after midnight: Thursday Do not drink clear liquids after: 8 AM Friday, day of surgery Take these medicines the morning of surgery with a SIP OF WATER:  Do not wear jewelry, make-up, or FINGER nail polish No metal in your hair or on your body. Do not wear lotions, powders, perfumes.  You may wear deodorant.  Do not bring valuables to the hospital. Contacts, dentures or bridgework may not be worn into surgery.  Patients discharged on the day of surgery will not be allowed to drive home.

## 2014-11-04 ENCOUNTER — Inpatient Hospital Stay (HOSPITAL_COMMUNITY)
Admission: RE | Admit: 2014-11-04 | Discharge: 2014-11-04 | Disposition: A | Discharge status: Home / Self Care | Payer: BLUE CROSS/BLUE SHIELD | Source: Ambulatory Visit

## 2014-11-08 ENCOUNTER — Encounter (HOSPITAL_COMMUNITY): Admission: RE | Payer: Self-pay | Source: Ambulatory Visit

## 2014-11-08 ENCOUNTER — Ambulatory Visit (HOSPITAL_COMMUNITY)
Admission: RE | Admit: 2014-11-08 | Payer: BLUE CROSS/BLUE SHIELD | Source: Ambulatory Visit | Admitting: Obstetrics & Gynecology

## 2014-11-08 SURGERY — DILATATION & CURETTAGE/HYSTEROSCOPY WITH NOVASURE ABLATION
Anesthesia: Choice

## 2015-01-02 ENCOUNTER — Ambulatory Visit (HOSPITAL_BASED_OUTPATIENT_CLINIC_OR_DEPARTMENT_OTHER): Payer: BLUE CROSS/BLUE SHIELD | Admitting: Family

## 2015-01-02 ENCOUNTER — Ambulatory Visit (HOSPITAL_BASED_OUTPATIENT_CLINIC_OR_DEPARTMENT_OTHER)
Admission: RE | Admit: 2015-01-02 | Discharge: 2015-01-02 | Disposition: A | Discharge status: Home / Self Care | Payer: BLUE CROSS/BLUE SHIELD | Source: Ambulatory Visit | Attending: Hematology & Oncology | Admitting: Hematology & Oncology

## 2015-01-02 ENCOUNTER — Other Ambulatory Visit (HOSPITAL_BASED_OUTPATIENT_CLINIC_OR_DEPARTMENT_OTHER): Payer: BLUE CROSS/BLUE SHIELD

## 2015-01-02 ENCOUNTER — Encounter: Payer: Self-pay | Admitting: Family

## 2015-01-02 VITALS — BP 139/92 | HR 55 | Temp 97.5°F | Resp 14 | Ht 67.0 in | Wt 248.0 lb

## 2015-01-02 DIAGNOSIS — I824Z1 Acute embolism and thrombosis of unspecified deep veins of right distal lower extremity: Secondary | ICD-10-CM | POA: Diagnosis present

## 2015-01-02 DIAGNOSIS — I82591 Chronic embolism and thrombosis of other specified deep vein of right lower extremity: Secondary | ICD-10-CM | POA: Diagnosis not present

## 2015-01-02 DIAGNOSIS — I82401 Acute embolism and thrombosis of unspecified deep veins of right lower extremity: Secondary | ICD-10-CM

## 2015-01-02 DIAGNOSIS — I82491 Acute embolism and thrombosis of other specified deep vein of right lower extremity: Secondary | ICD-10-CM | POA: Diagnosis not present

## 2015-01-02 LAB — CBC WITH DIFFERENTIAL (CANCER CENTER ONLY)
BASO#: 0.1 10*3/uL (ref 0.0–0.2)
BASO%: 0.8 % (ref 0.0–2.0)
EOS ABS: 0.1 10*3/uL (ref 0.0–0.5)
EOS%: 1.5 % (ref 0.0–7.0)
HCT: 42.8 % (ref 34.8–46.6)
HEMOGLOBIN: 14.4 g/dL (ref 11.6–15.9)
LYMPH#: 2.8 10*3/uL (ref 0.9–3.3)
LYMPH%: 31 % (ref 14.0–48.0)
MCH: 29.3 pg (ref 26.0–34.0)
MCHC: 33.6 g/dL (ref 32.0–36.0)
MCV: 87 fL (ref 81–101)
MONO#: 0.8 10*3/uL (ref 0.1–0.9)
MONO%: 8.6 % (ref 0.0–13.0)
NEUT%: 58.1 % (ref 39.6–80.0)
NEUTROS ABS: 5.2 10*3/uL (ref 1.5–6.5)
PLATELETS: 259 10*3/uL (ref 145–400)
RBC: 4.91 10*6/uL (ref 3.70–5.32)
RDW: 13.9 % (ref 11.1–15.7)
WBC: 8.9 10*3/uL (ref 3.9–10.0)

## 2015-01-02 LAB — BASIC METABOLIC PANEL (CC13)
ANION GAP: 5 meq/L (ref 3–11)
BUN: 19.1 mg/dL (ref 7.0–26.0)
CALCIUM: 9.7 mg/dL (ref 8.4–10.4)
CO2: 30 meq/L — AB (ref 22–29)
Chloride: 105 mEq/L (ref 98–109)
Creatinine: 0.8 mg/dL (ref 0.6–1.1)
EGFR: >90 mL/min/{1.73_m2} (ref >90)
Glucose: 86 mg/dl (ref 70–140)
Potassium: 4.9 mEq/L (ref 3.5–5.1)
SODIUM: 140 meq/L (ref 136–145)

## 2015-01-02 MED ORDER — RIVAROXABAN 10 MG PO TABS: 10.0000 mg | ORAL_TABLET | Freq: Every day | ORAL | Status: DC

## 2015-01-02 NOTE — Progress Notes (Signed)
Hematology and Oncology Follow Up Visit  Kim Fox [id number] May 03, 1966 49 y.o. 01/02/2015   Principle Diagnosis:  Right peroneal vein thrombus - chronic  Current Therapy:   Xarelto 20 mg by mouth daily-to finish April 2016 Xarelto 5 mg by mouth daily for maintenance therapy    Interim History:  [name] is here today for a follow-up. She is doing well and has no complaints at this time. 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 No evidence of propagation.  She continues to take her Xarelto 5 mg daily. There have been no episodes or bruising or bleeding.  She has had no pain, swelling, numbness or tingling in her extremities. No new aches or pains.  No fever, chills, n/v, cough, rash, dizziness, headaches, blurred vision, SOB, chest pain, palpitations, abdominal pain, constipation, diarrhea, blood in urine or stool.  No changes in her appetite. She is staying hydrated. Her weight is stable.   Medications:    Medication List       This list is accurate as of: 01/02/15 11:04 AM.  Always use your most recent med list.               atenolol 50 MG tablet  Commonly known as:  TENORMIN  Take 1 tablet (50 mg total) by mouth daily.     Biotin 5000 MCG Caps  Take 1 capsule by mouth daily.     Calcium-Vitamin D 600-200 MG-UNIT Caps  Take 1 tablet by mouth 2 (two) times daily.     folic acid 660 MCG tablet  Commonly known as:  FOLVITE  Take 400 mcg by mouth daily.     L-Lysine 1000 MG Tabs  Take 1 tablet by mouth daily.     multivitamin tablet  Take 1 tablet by mouth daily.     rivaroxaban 10 MG Tabs tablet  Commonly known as:  XARELTO  Take 0.5 tablets (5 mg total) by mouth daily with supper.     SUMAtriptan 100 MG tablet  Commonly known as:  IMITREX  Take at the onset of HA.  May repeat in 2 hours if needed.  Max dose 2/24 hours, 4/week        Allergies:  Allergies    Allergen Reactions  . Macrodantin Rash  . Penicillins Rash    Past Medical History, Surgical history, Social history, and Family History were reviewed and updated.  Review of Systems: All other 10 point review of systems is negative.   Physical Exam:  height is 5\' 7"  (1.702 m) and weight is 248 lb (112.492 kg). Her oral temperature is 97.5 F (36.4 C). Her blood pressure is 139/92 and her pulse is 55. Her respiration is 14.   Wt Readings from Last 3 Encounters:  01/02/15 248 lb (112.492 kg)  10/03/14 239 lb (108.41 kg)  08/29/14 241 lb (109.317 kg)    Ocular: Sclerae unicteric, pupils equal, round and reactive to light Ear-nose-throat: Oropharynx clear, dentition fair Lymphatic: No cervical or supraclavicular adenopathy Lungs no rales or rhonchi, good excursion bilaterally Heart regular rate and rhythm, no murmur appreciated Abd soft, nontender, positive bowel sounds MSK no focal spinal tenderness, no joint edema Neuro: non-focal, well-oriented, appropriate affect Breasts: Deferred  Lab Results  Component Value Date   WBC 8.9 01/02/2015   HGB 14.4 01/02/2015   HCT 42.8 01/02/2015   MCV 87 01/02/2015   PLT 259 01/02/2015  No results found for: FERRITIN, IRON, TIBC, UIBC, IRONPCTSAT Lab Results  Component Value Date   RBC 4.91 01/02/2015   No results found for: KPAFRELGTCHN, LAMBDASER, KAPLAMBRATIO No results found for: IGGSERUM, IGA, IGMSERUM No results found for: Odetta Pink, SPEI   Chemistry      Component Value Date/Time   NA 137 10/03/2014 1110   K 4.7 10/03/2014 1110   CL 103 10/03/2014 1110   CO2 28 10/03/2014 1110   BUN 22 10/03/2014 1110   CREATININE 0.77 10/03/2014 1110   CREATININE 0.79 08/29/2014 1330      Component Value Date/Time   CALCIUM 9.6 10/03/2014 1110   ALKPHOS 70 10/03/2014 1110   AST 23 10/03/2014 1110   ALT 17 10/03/2014 1110   BILITOT 0.4 10/03/2014 1110     Impression  and Plan: [name] is 49 year old white female with a right peroneal vein thrombus. She still has a persistent thrombus in the right peroneal vein. She is asymptomatic with this and has no complaints at this time.  She will continue on Xarelto 5 mg PO daily. I did change her prescription to 10 mg so she could get a bigger supply. She knows to take 5 mg daily.  We will plan to see her back in 6 months for labs and follow-up. We will also repeat an [place] on her right leg that day.  She knows to call here with any questions or concerns. We can certainly see her sooner if need be.   Eliezer Bottom, NP 7/14/201611:04 AM

## 2015-01-03 LAB — D-DIMER, QUANTITATIVE (NOT AT ARMC): D DIMER QUANT: 0.35 ug{FEU}/mL (ref 0.00–0.48)

## 2015-04-10 ENCOUNTER — Encounter: Payer: Self-pay | Admitting: Family Medicine

## 2015-04-10 ENCOUNTER — Ambulatory Visit (INDEPENDENT_AMBULATORY_CARE_PROVIDER_SITE_OTHER): Payer: BLUE CROSS/BLUE SHIELD | Admitting: Family Medicine

## 2015-04-10 VITALS — BP 110/82 | HR 61 | Temp 97.5°F | Ht 68.0 in | Wt 256.5 lb

## 2015-04-10 DIAGNOSIS — I10 Essential (primary) hypertension: Secondary | ICD-10-CM

## 2015-04-10 DIAGNOSIS — Z23 Encounter for immunization: Secondary | ICD-10-CM

## 2015-04-10 DIAGNOSIS — E669 Obesity, unspecified: Secondary | ICD-10-CM | POA: Diagnosis not present

## 2015-04-10 DIAGNOSIS — G43809 Other migraine, not intractable, without status migrainosus: Secondary | ICD-10-CM

## 2015-04-10 DIAGNOSIS — N938 Other specified abnormal uterine and vaginal bleeding: Secondary | ICD-10-CM

## 2015-04-10 DIAGNOSIS — K219 Gastro-esophageal reflux disease without esophagitis: Secondary | ICD-10-CM

## 2015-04-10 DIAGNOSIS — I825Z1 Chronic embolism and thrombosis of unspecified deep veins of right distal lower extremity: Secondary | ICD-10-CM | POA: Diagnosis not present

## 2015-04-10 DIAGNOSIS — Q6102 Congenital multiple renal cysts: Secondary | ICD-10-CM

## 2015-04-10 DIAGNOSIS — Z0000 Encounter for general adult medical examination without abnormal findings: Secondary | ICD-10-CM

## 2015-04-10 DIAGNOSIS — Z8619 Personal history of other infectious and parasitic diseases: Secondary | ICD-10-CM

## 2015-04-10 HISTORY — DX: Personal history of other infectious and parasitic diseases: Z86.19

## 2015-04-10 NOTE — Assessment & Plan Note (Signed)
Sees Dr Benjie Karvonen of GYN she is scheduled for endometrial Novasure Ablation soon for heavy, menstrual bleeding

## 2015-04-10 NOTE — Assessment & Plan Note (Signed)
Well controlled, no changes to meds. Encouraged heart healthy diet such as the DASH diet and exercise as tolerated.  °

## 2015-04-10 NOTE — Assessment & Plan Note (Signed)
Menstrual mostly

## 2015-04-10 NOTE — Assessment & Plan Note (Signed)
Rare, no meds

## 2015-04-10 NOTE — Assessment & Plan Note (Signed)
Encouraged DASH diet, decrease po intake and increase exercise as tolerated. Needs 7-8 hours of sleep nightly. Avoid trans fats, eat small, frequent meals every 4-5 hours with lean proteins, complex carbs and healthy fats. Minimize simple carbs, GMO foods. 

## 2015-04-10 NOTE — Assessment & Plan Note (Signed)
Strong family history, no history of kidney dysfunction

## 2015-04-10 NOTE — Progress Notes (Signed)
Pre visit review using our clinic review tool, if applicable. No additional management support is needed unless otherwise documented below in the visit note. 

## 2015-04-10 NOTE — Patient Instructions (Signed)
Salon pas patches or [employer] patch  Or Aspercreme with Lidocaine   Sacroiliac Joint Dysfunction Sacroiliac joint dysfunction is a condition that causes inflammation on one or both sides of the sacroiliac (SI) joint. The SI joint connects the lower part of the spine (sacrum) with the two upper portions of the pelvis (ilium). This condition causes deep aching or burning pain in the low back. In some cases, the pain may also spread into one or both buttocks or hips or spread down the legs. CAUSES This condition may be caused by:  Pregnancy. During pregnancy, extra stress is put on the SI joints because the pelvis widens.  Injury, such as:  Car accidents.  Sport-related injuries.  Work-related injuries.  Having one leg that is shorter than the other.  Conditions that affect the joints, such as:  Rheumatoid arthritis.  Gout.  Psoriatic arthritis.  Joint infection (septic arthritis). Sometimes, the cause of SI joint dysfunction is not known. SYMPTOMS Symptoms of this condition include:  Aching or burning pain in the lower back. The pain may also spread to other areas, such as:  Buttocks.  Groin.  Thighs and legs.  Muscle spasms in or around the painful areas.  Increased pain when standing, walking, running, stair climbing, bending, or lifting. DIAGNOSIS Your health care provider will do a physical exam and take your medical history. During the exam, the health care provider may move one or both of your legs to different positions to check for pain. Various tests may be done to help verify the diagnosis, including:  Imaging tests to look for other causes of pain. These may include:  MRI.  CT scan.  Bone scan.  Diagnostic injection. A numbing medicine is injected into the SI joint using a needle. If the pain is temporarily improved or stopped after the injection, this can indicate that SI joint dysfunction is the problem. TREATMENT Treatment may vary depending on the  cause and severity of your condition. Treatment options may include:  Applying ice or heat to the lower back area. This can help to reduce pain and muscle spasms.  Medicines to relieve pain or inflammation or to relax the muscles.  Wearing a back brace (sacroiliac brace) to help support the joint while your back is healing.  Physical therapy to increase muscle strength around the joint and flexibility at the joint. This may also involve learning proper body positions and ways of moving to relieve stress on the joint.  Direct manipulation of the SI joint.  Injections of steroid medicine into the joint in order to reduce pain and swelling.  Radiofrequency ablation to burn away nerves that are carrying pain messages from the joint.  Use of a device that provides electrical stimulation in order to reduce pain at the joint.  Surgery to put in screws and plates that limit or prevent joint motion. This is rare. HOME CARE INSTRUCTIONS  Rest as needed. Limit your activities as directed by your health care provider.  Take medicines only as directed by your health care provider.  If directed, apply ice to the affected area:  Put ice in a plastic bag.  Place a towel between your skin and the bag.  Leave the ice on for 20 minutes, 2-3 times per day.  Use a heating pad or a moist heat pack as directed by your health care provider.  Exercise as directed by your health care provider or physical therapist.  Keep all follow-up visits as directed by your health care  provider. This is important. SEEK MEDICAL CARE IF:  Your pain is not controlled with medicine.  You have a fever.  You have increasingly severe pain. SEEK IMMEDIATE MEDICAL CARE IF:  You have weakness, numbness, or tingling in your legs or feet.  You lose control of your bladder or bowel.   This information is not intended to replace advice given to you by your health care provider. Make sure you discuss any questions you  have with your health care provider.   Document Released: 09/03/2008 Document Revised: 10/22/2014 Document Reviewed: 02/12/2014 Elsevier Interactive Patient Education 2016 [place] for Adults, Female A healthy lifestyle and preventive care can promote health and wellness. Preventive health guidelines for women include the following key practices.  A routine yearly physical is a good way to check with your health care provider about your health and preventive screening. It is a chance to share any concerns and updates on your health and to receive a thorough exam.  Visit your dentist for a routine exam and preventive care every 6 months. Brush your teeth twice a day and floss once a day. Good oral hygiene prevents tooth decay and gum disease.  The frequency of eye exams is based on your age, health, family medical history, use of contact lenses, and other factors. Follow your health care provider's recommendations for frequency of eye exams.  Eat a healthy diet. Foods like vegetables, fruits, whole grains, low-fat dairy products, and lean protein foods contain the nutrients you need without too many calories. Decrease your intake of foods high in solid fats, added sugars, and salt. Eat the right amount of calories for you.Get information about a proper diet from your health care provider, if necessary.  Regular physical exercise is one of the most important things you can do for your health. Most adults should get at least 150 minutes of moderate-intensity exercise (any activity that increases your heart rate and causes you to sweat) each week. In addition, most adults need muscle-strengthening exercises on 2 or more days a week.  Maintain a healthy weight. The body mass index (BMI) is a screening tool to identify possible weight problems. It provides an estimate of body fat based on height and weight. Your health care provider can find your BMI and can help you achieve or  maintain a healthy weight.For adults 20 years and older:  A BMI below 18.5 is considered underweight.  A BMI of 18.5 to 24.9 is normal.  A BMI of 25 to 29.9 is considered overweight.  A BMI of 30 and above is considered obese.  Maintain normal blood lipids and cholesterol levels by exercising and minimizing your intake of saturated fat. Eat a balanced diet with plenty of fruit and vegetables. Blood tests for lipids and cholesterol should begin at age 43 and be repeated every 5 years. If your lipid or cholesterol levels are high, you are over 50, or you are at high risk for heart disease, you may need your cholesterol levels checked more frequently.Ongoing high lipid and cholesterol levels should be treated with medicines if diet and exercise are not working.  If you smoke, find out from your health care provider how to quit. If you do not use tobacco, do not start.  Lung cancer screening is recommended for adults aged 36-80 years who are at high risk for developing lung cancer because of a history of smoking. A yearly low-dose CT scan of the lungs is recommended for people who have at  least a 30-pack-year history of smoking and are a current smoker or have quit within the past 15 years. A pack year of smoking is smoking an average of 1 pack of cigarettes a day for 1 year (for example: 1 pack a day for 30 years or 2 packs a day for 15 years). Yearly screening should continue until the smoker has stopped smoking for at least 15 years. Yearly screening should be stopped for people who develop a health problem that would prevent them from having lung cancer treatment.  If you are pregnant, do not drink alcohol. If you are breastfeeding, be very cautious about drinking alcohol. If you are not pregnant and choose to drink alcohol, do not have more than 1 drink per day. One drink is considered to be 12 ounces (355 mL) of beer, 5 ounces (148 mL) of wine, or 1.5 ounces (44 mL) of liquor.  Avoid use of  street drugs. Do not share needles with anyone. Ask for help if you need support or instructions about stopping the use of drugs.  High blood pressure causes heart disease and increases the risk of stroke. Your blood pressure should be checked at least every 1 to 2 years. Ongoing high blood pressure should be treated with medicines if weight loss and exercise do not work.  If you are 51-29 years old, ask your health care provider if you should take aspirin to prevent strokes.  Diabetes screening is done by taking a blood sample to check your blood glucose level after you have not eaten for a certain period of time (fasting). If you are not overweight and you do not have risk factors for diabetes, you should be screened once every 3 years starting at age 15. If you are overweight or obese and you are 57-19 years of age, you should be screened for diabetes every year as part of your cardiovascular risk assessment.  Breast cancer screening is essential preventive care for women. You should practice "breast self-awareness." This means understanding the normal appearance and feel of your breasts and may include breast self-examination. Any changes detected, no matter how small, should be reported to a health care provider. Women in their 85s and 30s should have a clinical breast exam (CBE) by a health care provider as part of a regular health exam every 1 to 3 years. After age 53, women should have a CBE every year. Starting at age 66, women should consider having a mammogram (breast X-ray test) every year. Women who have a family history of breast cancer should talk to their health care provider about genetic screening. Women at a high risk of breast cancer should talk to their health care providers about having an MRI and a mammogram every year.  Breast cancer gene (BRCA)-related cancer risk assessment is recommended for women who have family members with BRCA-related cancers. BRCA-related cancers include  breast, ovarian, tubal, and peritoneal cancers. Having family members with these cancers may be associated with an increased risk for harmful changes (mutations) in the breast cancer genes BRCA1 and BRCA2. Results of the assessment will determine the need for genetic counseling and BRCA1 and BRCA2 testing.  Your health care provider may recommend that you be screened regularly for cancer of the pelvic organs (ovaries, uterus, and vagina). This screening involves a pelvic examination, including checking for microscopic changes to the surface of your cervix (Pap test). You may be encouraged to have this screening done every 3 years, beginning at age 67.  For  women ages 61-65, health care providers may recommend pelvic exams and Pap testing every 3 years, or they may recommend the Pap and pelvic exam, combined with testing for human papilloma virus (HPV), every 5 years. Some types of HPV increase your risk of cervical cancer. Testing for HPV may also be done on women of any age with unclear Pap test results.  Other health care providers may not recommend any screening for nonpregnant women who are considered low risk for pelvic cancer and who do not have symptoms. Ask your health care provider if a screening pelvic exam is right for you.  If you have had past treatment for cervical cancer or a condition that could lead to cancer, you need Pap tests and screening for cancer for at least 20 years after your treatment. If Pap tests have been discontinued, your risk factors (such as having a new sexual partner) need to be reassessed to determine if screening should resume. Some women have medical problems that increase the chance of getting cervical cancer. In these cases, your health care provider may recommend more frequent screening and Pap tests.  Colorectal cancer can be detected and often prevented. Most routine colorectal cancer screening begins at the age of 78 years and continues through age 26 years.  However, your health care provider may recommend screening at an earlier age if you have risk factors for colon cancer. On a yearly basis, your health care provider may provide home test kits to check for hidden blood in the stool. Use of a small camera at the end of a tube, to directly examine the colon (sigmoidoscopy or colonoscopy), can detect the earliest forms of colorectal cancer. Talk to your health care provider about this at age 52, when routine screening begins. Direct exam of the colon should be repeated every 5-10 years through age 68 years, unless early forms of precancerous polyps or small growths are found.  People who are at an increased risk for hepatitis B should be screened for this virus. You are considered at high risk for hepatitis B if:  You were born in a country where hepatitis B occurs often. Talk with your health care provider about which countries are considered high risk.  Your parents were born in a high-risk country and you have not received a shot to protect against hepatitis B (hepatitis B vaccine).  You have HIV or AIDS.  You use needles to inject street drugs.  You live with, or have sex with, someone who has hepatitis B.  You get hemodialysis treatment.  You take certain medicines for conditions like cancer, organ transplantation, and autoimmune conditions.  Hepatitis C blood testing is recommended for all people born from [birth year] through 1964 and any individual with known risks for hepatitis C.  Practice safe sex. Use condoms and avoid high-risk sexual practices to reduce the spread of sexually transmitted infections (STIs). STIs include gonorrhea, chlamydia, syphilis, trichomonas, herpes, HPV, and human immunodeficiency virus (HIV). Herpes, HIV, and HPV are viral illnesses that have no cure. They can result in disability, cancer, and death.  You should be screened for sexually transmitted illnesses (STIs) including gonorrhea and chlamydia if:  You are  sexually active and are younger than 24 years.  You are older than 24 years and your health care provider tells you that you are at risk for this type of infection.  Your sexual activity has changed since you were last screened and you are at an increased risk for chlamydia or gonorrhea.  Ask your health care provider if you are at risk.  If you are at risk of being infected with HIV, it is recommended that you take a prescription medicine daily to prevent HIV infection. This is called preexposure prophylaxis (PrEP). You are considered at risk if:  You are sexually active and do not regularly use condoms or know the HIV status of your partner(s).  You take drugs by injection.  You are sexually active with a partner who has HIV.  Talk with your health care provider about whether you are at high risk of being infected with HIV. If you choose to begin PrEP, you should first be tested for HIV. You should then be tested every 3 months for as long as you are taking PrEP.  Osteoporosis is a disease in which the bones lose minerals and strength with aging. This can result in serious bone fractures or breaks. The risk of osteoporosis can be identified using a bone density scan. Women ages 6 years and over and women at risk for fractures or osteoporosis should discuss screening with their health care providers. Ask your health care provider whether you should take a calcium supplement or vitamin D to reduce the rate of osteoporosis.  Menopause can be associated with physical symptoms and risks. Hormone replacement therapy is available to decrease symptoms and risks. You should talk to your health care provider about whether hormone replacement therapy is right for you.  Use sunscreen. Apply sunscreen liberally and repeatedly throughout the day. You should seek shade when your shadow is shorter than you. Protect yourself by wearing long sleeves, pants, a wide-brimmed hat, and sunglasses year round, whenever  you are outdoors.  Once a month, do a whole body skin exam, using a mirror to look at the skin on your back. Tell your health care provider of new moles, moles that have irregular borders, moles that are larger than a pencil eraser, or moles that have changed in shape or color.  Stay current with required vaccines (immunizations).  Influenza vaccine. All adults should be immunized every year.  Tetanus, diphtheria, and acellular pertussis (Td, Tdap) vaccine. Pregnant women should receive 1 dose of Tdap vaccine during each pregnancy. The dose should be obtained regardless of the length of time since the last dose. Immunization is preferred during the 27th-36th week of gestation. An adult who has not previously received Tdap or who does not know her vaccine status should receive 1 dose of Tdap. This initial dose should be followed by tetanus and diphtheria toxoids (Td) booster doses every 10 years. Adults with an unknown or incomplete history of completing a 3-dose immunization series with Td-containing vaccines should begin or complete a primary immunization series including a Tdap dose. Adults should receive a Td booster every 10 years.  Varicella vaccine. An adult without evidence of immunity to varicella should receive 2 doses or a second dose if she has previously received 1 dose. Pregnant females who do not have evidence of immunity should receive the first dose after pregnancy. This first dose should be obtained before leaving the health care facility. The second dose should be obtained 4-8 weeks after the first dose.  Human papillomavirus (HPV) vaccine. Females aged 13-26 years who have not received the vaccine previously should obtain the 3-dose series. The vaccine is not recommended for use in pregnant females. However, pregnancy testing is not needed before receiving a dose. If a female is found to be pregnant after receiving a dose, no treatment is needed.  In that case, the remaining doses  should be delayed until after the pregnancy. Immunization is recommended for any person with an immunocompromised condition through the age of 10 years if she did not get any or all doses earlier. During the 3-dose series, the second dose should be obtained 4-8 weeks after the first dose. The third dose should be obtained 24 weeks after the first dose and 16 weeks after the second dose.  Zoster vaccine. One dose is recommended for adults aged 9 years or older unless certain conditions are present.  Measles, mumps, and rubella (MMR) vaccine. Adults born before [birth year] generally are considered immune to measles and mumps. Adults born in [birth year] or later should have 1 or more doses of MMR vaccine unless there is a contraindication to the vaccine or there is laboratory evidence of immunity to each of the three diseases. A routine second dose of MMR vaccine should be obtained at least 28 days after the first dose for students attending postsecondary schools, health care workers, or international travelers. People who received inactivated measles vaccine or an unknown type of measles vaccine during 1963-1967 should receive 2 doses of MMR vaccine. People who received inactivated mumps vaccine or an unknown type of mumps vaccine before 1979 and are at high risk for mumps infection should consider immunization with 2 doses of MMR vaccine. For females of childbearing age, rubella immunity should be determined. If there is no evidence of immunity, females who are not pregnant should be vaccinated. If there is no evidence of immunity, females who are pregnant should delay immunization until after pregnancy. Unvaccinated health care workers born before [birth year] who lack laboratory evidence of measles, mumps, or rubella immunity or laboratory confirmation of disease should consider measles and mumps immunization with 2 doses of MMR vaccine or rubella immunization with 1 dose of MMR vaccine.  Pneumococcal 13-valent conjugate (PCV13)  vaccine. When indicated, a person who is uncertain of his immunization history and has no record of immunization should receive the PCV13 vaccine. All adults 93 years of age and older should receive this vaccine. An adult aged 40 years or older who has certain medical conditions and has not been previously immunized should receive 1 dose of PCV13 vaccine. This PCV13 should be followed with a dose of pneumococcal polysaccharide (PPSV23) vaccine. Adults who are at high risk for pneumococcal disease should obtain the PPSV23 vaccine at least 8 weeks after the dose of PCV13 vaccine. Adults older than 48 years of age who have normal immune system function should obtain the PPSV23 vaccine dose at least 1 year after the dose of PCV13 vaccine.  Pneumococcal polysaccharide (PPSV23) vaccine. When PCV13 is also indicated, PCV13 should be obtained first. All adults aged 39 years and older should be immunized. An adult younger than age 40 years who has certain medical conditions should be immunized. Any person who resides in a nursing home or long-term care facility should be immunized. An adult smoker should be immunized. People with an immunocompromised condition and certain other conditions should receive both PCV13 and PPSV23 vaccines. People with human immunodeficiency virus (HIV) infection should be immunized as soon as possible after diagnosis. Immunization during chemotherapy or radiation therapy should be avoided. Routine use of PPSV23 vaccine is not recommended for American Indians, [place] Natives, or people younger than 65 years unless there are medical conditions that require PPSV23 vaccine. When indicated, people who have unknown immunization and have no record of immunization should receive PPSV23 vaccine. One-time revaccination 5 years  after the first dose of PPSV23 is recommended for people aged 19-64 years who have chronic kidney failure, nephrotic syndrome, asplenia, or immunocompromised conditions. People who  received 1-2 doses of PPSV23 before age 25 years should receive another dose of PPSV23 vaccine at age 74 years or later if at least 5 years have passed since the previous dose. Doses of PPSV23 are not needed for people immunized with PPSV23 at or after age 60 years.  Meningococcal vaccine. Adults with asplenia or persistent complement component deficiencies should receive 2 doses of quadrivalent meningococcal conjugate (MenACWY-D) vaccine. The doses should be obtained at least 2 months apart. Microbiologists working with certain meningococcal bacteria, [employer] recruits, people at risk during an outbreak, and people who travel to or live in countries with a high rate of meningitis should be immunized. A first-year college student up through age 24 years who is living in a residence hall should receive a dose if she did not receive a dose on or after her 16th birthday. Adults who have certain high-risk conditions should receive one or more doses of vaccine.  Hepatitis A vaccine. Adults who wish to be protected from this disease, have certain high-risk conditions, work with hepatitis A-infected animals, work in hepatitis A research labs, or travel to or work in countries with a high rate of hepatitis A should be immunized. Adults who were previously unvaccinated and who anticipate close contact with an international adoptee during the first 60 days after arrival in the [place] from a country with a high rate of hepatitis A should be immunized.  Hepatitis B vaccine. Adults who wish to be protected from this disease, have certain high-risk conditions, may be exposed to blood or other infectious body fluids, are household contacts or sex partners of hepatitis B positive people, are clients or workers in certain care facilities, or travel to or work in countries with a high rate of hepatitis B should be immunized.  Haemophilus influenzae type b (Hib) vaccine. A previously unvaccinated person with asplenia or  sickle cell disease or having a scheduled splenectomy should receive 1 dose of Hib vaccine. Regardless of previous immunization, a recipient of a hematopoietic stem cell transplant should receive a 3-dose series 6-12 months after her successful transplant. Hib vaccine is not recommended for adults with HIV infection. Preventive Services / Frequency Ages 66 to 60 years  Blood pressure check.** / Every 3-5 years.  Lipid and cholesterol check.** / Every 5 years beginning at age 75.  Clinical breast exam.** / Every 3 years for women in their 48s and 44s.  BRCA-related cancer risk assessment.** / For women who have family members with a BRCA-related cancer (breast, ovarian, tubal, or peritoneal cancers).  Pap test.** / Every 2 years from ages 71 through 25. Every 3 years starting at age 58 through age 72 or 72 with a history of 3 consecutive normal Pap tests.  HPV screening.** / Every 3 years from ages 12 through ages 24 to 57 with a history of 3 consecutive normal Pap tests.  Hepatitis C blood test.** / For any individual with known risks for hepatitis C.  Skin self-exam. / Monthly.  Influenza vaccine. / Every year.  Tetanus, diphtheria, and acellular pertussis (Tdap, Td) vaccine.** / Consult your health care provider. Pregnant women should receive 1 dose of Tdap vaccine during each pregnancy. 1 dose of Td every 10 years.  Varicella vaccine.** / Consult your health care provider. Pregnant females who do not have evidence of immunity should receive  the first dose after pregnancy.  HPV vaccine. / 3 doses over 6 months, if 71 and younger. The vaccine is not recommended for use in pregnant females. However, pregnancy testing is not needed before receiving a dose.  Measles, mumps, rubella (MMR) vaccine.** / You need at least 1 dose of MMR if you were born in 1957 or later. You may also need a 2nd dose. For females of childbearing age, rubella immunity should be determined. If there is no evidence  of immunity, females who are not pregnant should be vaccinated. If there is no evidence of immunity, females who are pregnant should delay immunization until after pregnancy.  Pneumococcal 13-valent conjugate (PCV13) vaccine.** / Consult your health care provider.  Pneumococcal polysaccharide (PPSV23) vaccine.** / 1 to 2 doses if you smoke cigarettes or if you have certain conditions.  Meningococcal vaccine.** / 1 dose if you are age 33 to 71 years and a Market researcher living in a residence hall, or have one of several medical conditions, you need to get vaccinated against meningococcal disease. You may also need additional booster doses.  Hepatitis A vaccine.** / Consult your health care provider.  Hepatitis B vaccine.** / Consult your health care provider.  Haemophilus influenzae type b (Hib) vaccine.** / Consult your health care provider. Ages 76 to 31 years  Blood pressure check.** / Every year.  Lipid and cholesterol check.** / Every 5 years beginning at age 37 years.  Lung cancer screening. / Every year if you are aged 18-80 years and have a 30-pack-year history of smoking and currently smoke or have quit within the past 15 years. Yearly screening is stopped once you have quit smoking for at least 15 years or develop a health problem that would prevent you from having lung cancer treatment.  Clinical breast exam.** / Every year after age 9 years.  BRCA-related cancer risk assessment.** / For women who have family members with a BRCA-related cancer (breast, ovarian, tubal, or peritoneal cancers).  Mammogram.** / Every year beginning at age 28 years and continuing for as long as you are in good health. Consult with your health care provider.  Pap test.** / Every 3 years starting at age 64 years through age 47 or 58 years with a history of 3 consecutive normal Pap tests.  HPV screening.** / Every 3 years from ages 56 years through ages 19 to 51 years with a history of 3  consecutive normal Pap tests.  Fecal occult blood test (FOBT) of stool. / Every year beginning at age 68 years and continuing until age 28 years. You may not need to do this test if you get a colonoscopy every 10 years.  Flexible sigmoidoscopy or colonoscopy.** / Every 5 years for a flexible sigmoidoscopy or every 10 years for a colonoscopy beginning at age 93 years and continuing until age 2 years.  Hepatitis C blood test.** / For all people born from [birth year] through 1965 and any individual with known risks for hepatitis C.  Skin self-exam. / Monthly.  Influenza vaccine. / Every year.  Tetanus, diphtheria, and acellular pertussis (Tdap/Td) vaccine.** / Consult your health care provider. Pregnant women should receive 1 dose of Tdap vaccine during each pregnancy. 1 dose of Td every 10 years.  Varicella vaccine.** / Consult your health care provider. Pregnant females who do not have evidence of immunity should receive the first dose after pregnancy.  Zoster vaccine.** / 1 dose for adults aged 70 years or older.  Measles, mumps, rubella (MMR)  vaccine.** / You need at least 1 dose of MMR if you were born in 1957 or later. You may also need a second dose. For females of childbearing age, rubella immunity should be determined. If there is no evidence of immunity, females who are not pregnant should be vaccinated. If there is no evidence of immunity, females who are pregnant should delay immunization until after pregnancy.  Pneumococcal 13-valent conjugate (PCV13) vaccine.** / Consult your health care provider.  Pneumococcal polysaccharide (PPSV23) vaccine.** / 1 to 2 doses if you smoke cigarettes or if you have certain conditions.  Meningococcal vaccine.** / Consult your health care provider.  Hepatitis A vaccine.** / Consult your health care provider.  Hepatitis B vaccine.** / Consult your health care provider.  Haemophilus influenzae type b (Hib) vaccine.** / Consult your health care  provider. Ages 93 years and over  Blood pressure check.** / Every year.  Lipid and cholesterol check.** / Every 5 years beginning at age 38 years.  Lung cancer screening. / Every year if you are aged 70-80 years and have a 30-pack-year history of smoking and currently smoke or have quit within the past 15 years. Yearly screening is stopped once you have quit smoking for at least 15 years or develop a health problem that would prevent you from having lung cancer treatment.  Clinical breast exam.** / Every year after age 45 years.  BRCA-related cancer risk assessment.** / For women who have family members with a BRCA-related cancer (breast, ovarian, tubal, or peritoneal cancers).  Mammogram.** / Every year beginning at age 27 years and continuing for as long as you are in good health. Consult with your health care provider.  Pap test.** / Every 3 years starting at age 35 years through age 24 or 74 years with 3 consecutive normal Pap tests. Testing can be stopped between 65 and 70 years with 3 consecutive normal Pap tests and no abnormal Pap or HPV tests in the past 10 years.  HPV screening.** / Every 3 years from ages 45 years through ages 41 or 80 years with a history of 3 consecutive normal Pap tests. Testing can be stopped between 65 and 70 years with 3 consecutive normal Pap tests and no abnormal Pap or HPV tests in the past 10 years.  Fecal occult blood test (FOBT) of stool. / Every year beginning at age 51 years and continuing until age 28 years. You may not need to do this test if you get a colonoscopy every 10 years.  Flexible sigmoidoscopy or colonoscopy.** / Every 5 years for a flexible sigmoidoscopy or every 10 years for a colonoscopy beginning at age 73 years and continuing until age 87 years.  Hepatitis C blood test.** / For all people born from [birth year] through 1965 and any individual with known risks for hepatitis C.  Osteoporosis screening.** / A one-time screening for women ages  96 years and over and women at risk for fractures or osteoporosis.  Skin self-exam. / Monthly.  Influenza vaccine. / Every year.  Tetanus, diphtheria, and acellular pertussis (Tdap/Td) vaccine.** / 1 dose of Td every 10 years.  Varicella vaccine.** / Consult your health care provider.  Zoster vaccine.** / 1 dose for adults aged 51 years or older.  Pneumococcal 13-valent conjugate (PCV13) vaccine.** / Consult your health care provider.  Pneumococcal polysaccharide (PPSV23) vaccine.** / 1 dose for all adults aged 42 years and older.  Meningococcal vaccine.** / Consult your health care provider.  Hepatitis A vaccine.** / Consult your health  care provider.  Hepatitis B vaccine.** / Consult your health care provider.  Haemophilus influenzae type b (Hib) vaccine.** / Consult your health care provider. ** Family history and personal history of risk and conditions may change your health care provider's recommendations.   This information is not intended to replace advice given to you by your health care provider. Make sure you discuss any questions you have with your health care provider.   Document Released: 08/03/2001 Document Revised: 06/28/2014 Document Reviewed: 11/02/2010 Elsevier Interactive Patient Education [employer].

## 2015-04-10 NOTE — Assessment & Plan Note (Signed)
Sees Dr Marin Olp in January, so far surveillance of DVT shows it has not resolved continues [place]

## 2015-04-20 ENCOUNTER — Encounter: Payer: Self-pay | Admitting: Family Medicine

## 2015-04-20 DIAGNOSIS — Z0000 Encounter for general adult medical examination without abnormal findings: Secondary | ICD-10-CM

## 2015-04-20 HISTORY — DX: Encounter for general adult medical examination without abnormal findings: Z00.00

## 2015-04-20 NOTE — Assessment & Plan Note (Signed)
Flu shot today. Follows with GYN for paps.

## 2015-04-20 NOTE — Progress Notes (Signed)
Subjective:    Patient ID: Kim Fox, female    DOB: 1966-02-13, 49 y.o.   MRN: [id number]  Chief Complaint  Patient presents with  . Establish Care    HPI Patient is in today for new patient appointment. She feels well today and is in her usual state of health. No recent illness or acute concerns. Is in need of a new primary care doctor. Does struggle with migraine headaches but has been generally under control. Past medical history is also significant for reflux, hypertension, obesity and polycystic kidney disease. Denies CP/palp/SOB/HA/congestion/fevers/GI or GU c/o. Taking meds as prescribed  Past Medical History  Diagnosis Date  . Hypertension   . Headache(784.0)     migraine  . GERD (gastroesophageal reflux disease)   . Polycystic kidney disease   . Obesity   . Menorrhagia   . Murmur, cardiac     Echo WNL 2009  . I 04/04/2014  . Multiple renal cysts 08/09/2011  . History of chicken pox 04/10/2015  . Preventative health care 04/20/2015    Past Surgical History  Procedure Laterality Date  . Tonsillectomy    . Ureteral stent placement  2002  . Kidney cyst removal  2002  . Endometrial biopsy  2010    Family History  Problem Relation Age of Onset  . Diabetes Mother     insulin dependent  . Hypertension Mother   . Migraines Mother   . Varicose Veins Mother   . Arthritis Mother   . Diabetes Father   . Hypertension Father   . Kidney disease Father   . Arthritis Father   . Migraines Sister   . Allergies Sister   . Renal Cyst Sister   . Cancer Sister     skin cancer  . Dysfunctional uterine bleeding Sister   . Cancer Paternal Grandmother     lung  . Cancer Paternal Grandfather     bladder, prostate  . Heart disease Paternal Grandfather   . Hyperlipidemia Daughter   . Fibromyalgia Sister   . Renal Cyst Sister   . Hypertension Sister   . Dysfunctional uterine bleeding Sister     Social History   Social History  . Marital Status: Divorced   Spouse Name: N/A  . Number of Children: 2  . 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 Smoking status: Never Smoker   . Smokeless tobacco: Never Used     Comment: never used tobacco  . Alcohol Use: 0.6 oz/week    1 Standard drinks or equivalent per week  . Drug Use: No  . Sexual Activity: Not Currently     Comment: lives with daughter and occasionally son, works in [place], no major dietary   Other Topics Concern  . Not on file   Social History Narrative    Outpatient Prescriptions Prior to Visit  Medication Sig Dispense Refill  . atenolol (TENORMIN) 50 MG tablet Take 1 tablet (50 mg total) by mouth daily. 90 tablet 3  . Biotin 5000 MCG CAPS Take 1 capsule by mouth daily.    . Calcium Carbonate-Vitamin D (CALCIUM-VITAMIN D) 600-200 MG-UNIT CAPS Take 1 tablet by mouth 2 (two) times daily.    . folic acid (FOLVITE) 357 MCG tablet Take 400 mcg by mouth daily.    Marland Kitchen L-Lysine 1000 MG TABS Take 1 tablet by mouth daily.    . Multiple Vitamin (MULTIVITAMIN) tablet Take 1  tablet by mouth daily.    . rivaroxaban (XARELTO) 10 MG TABS tablet Take 1 tablet (10 mg total) by mouth daily with supper. 30 tablet 6  . SUMAtriptan (IMITREX) 100 MG tablet Take at the onset of HA.  May repeat in 2 hours if needed.  Max dose 2/24 hours, 4/week 10 tablet 3   No facility-administered medications prior to visit.    Allergies  Allergen Reactions  . Macrodantin Rash  . Penicillins Rash    Review of Systems  Constitutional: Negative for fever and malaise/fatigue.  HENT: Negative for congestion.   Eyes: Negative for discharge.  Respiratory: Negative for shortness of breath.   Cardiovascular: Negative for chest pain, palpitations and leg swelling.  Gastrointestinal: Negative for nausea and abdominal pain.  Genitourinary: Negative for dysuria.  Musculoskeletal: Negative for falls.  Skin: Negative for rash.    Neurological: Positive for headaches. Negative for loss of consciousness.  Endo/Heme/Allergies: Negative for environmental allergies.  Psychiatric/Behavioral: Negative for depression. The patient is not nervous/anxious.        Objective:    Physical Exam  Constitutional: She is oriented to person, place, and time. She appears well-developed and well-nourished. No distress.  HENT:  Head: Normocephalic and atraumatic.  Eyes: Conjunctivae are normal.  Neck: Neck supple. No thyromegaly present.  Cardiovascular: Normal rate, regular rhythm and normal heart sounds.   No murmur heard. Pulmonary/Chest: Effort normal and breath sounds normal. No respiratory distress.  Abdominal: Soft. Bowel sounds are normal. She exhibits no distension and no mass. There is no tenderness.  Musculoskeletal: She exhibits no edema.  Lymphadenopathy:    She has no cervical adenopathy.  Neurological: She is alert and oriented to person, place, and time.  Skin: Skin is warm and dry.  Psychiatric: She has a normal mood and affect. Her behavior is normal.    BP 110/82 mmHg  Pulse 61  Temp(Src) 97.5 F (36.4 C) (Oral)  Ht '5\' 8"'  (1.727 m)  Wt 256 lb 8 oz (116.348 kg)  BMI 39.01 kg/m2  SpO2 96% Wt Readings from Last 3 Encounters:  04/10/15 256 lb 8 oz (116.348 kg)  01/02/15 248 lb (112.492 kg)  10/03/14 239 lb (108.41 kg)     Lab Results  Component Value Date   WBC 8.9 01/02/2015   HGB 14.4 01/02/2015   HCT 42.8 01/02/2015   PLT 259 01/02/2015   GLUCOSE 86 01/02/2015   CHOL 145 08/29/2014   TRIG 101 08/29/2014   HDL 43* 08/29/2014   LDLCALC 82 08/29/2014   ALT 17 10/03/2014   AST 23 10/03/2014   NA 140 01/02/2015   K 4.9 01/02/2015   CL 103 10/03/2014   CREATININE 0.8 01/02/2015   BUN 19.1 01/02/2015   CO2 30* 01/02/2015   TSH 0.908 08/29/2014   INR 1.03 03/25/2014    Lab Results  Component Value Date   TSH 0.908 08/29/2014   Lab Results  Component Value Date   WBC 8.9 01/02/2015    HGB 14.4 01/02/2015   HCT 42.8 01/02/2015   MCV 87 01/02/2015   PLT 259 01/02/2015   Lab Results  Component Value Date   NA 140 01/02/2015   K 4.9 01/02/2015   CHLORIDE 105 01/02/2015   CO2 30* 01/02/2015   GLUCOSE 86 01/02/2015   BUN 19.1 01/02/2015   CREATININE 0.8 01/02/2015   BILITOT 0.4 10/03/2014   ALKPHOS 70 10/03/2014   AST 23 10/03/2014   ALT 17 10/03/2014   PROT 7.5 10/03/2014  ALBUMIN 3.8 10/03/2014   CALCIUM 9.7 01/02/2015   ANIONGAP 5 01/02/2015   EGFR >90 01/02/2015   Lab Results  Component Value Date   CHOL 145 08/29/2014   Lab Results  Component Value Date   HDL 43* 08/29/2014   Lab Results  Component Value Date   LDLCALC 82 08/29/2014   Lab Results  Component Value Date   TRIG 101 08/29/2014   Lab Results  Component Value Date   CHOLHDL 3.4 08/29/2014   No results found for: HGBA1C     Assessment & Plan:   Problem List Items Addressed This Visit    Right leg DVT (HCC)    Sees Dr Marin Olp in January, so far surveillance of DVT shows it has not resolved continues [place]      Relevant Orders   TSH   CBC   Comprehensive metabolic panel   Lipid panel   Preventative health care    Flu shot today. Follows with GYN for paps.       Relevant Orders   TSH   CBC   Comprehensive metabolic panel   Lipid panel   Obesity    Encouraged DASH diet, decrease po intake and increase exercise as tolerated. Needs 7-8 hours of sleep nightly. Avoid trans fats, eat small, frequent meals every 4-5 hours with lean proteins, complex carbs and healthy fats. Minimize simple carbs, GMO foods.      Relevant Orders   TSH   CBC   Comprehensive metabolic panel   Lipid panel   Multiple renal cysts    Strong family history, no history of kidney dysfunction      Relevant Orders   TSH   CBC   Comprehensive metabolic panel   Lipid panel   Migraine    Menstrual mostly      Relevant Orders   TSH   CBC   Comprehensive metabolic panel   Lipid panel    History of chicken pox   Relevant Orders   TSH   CBC   Comprehensive metabolic panel   Lipid panel   GERD (gastroesophageal reflux disease)    Rare, no meds      Relevant Orders   TSH   CBC   Comprehensive metabolic panel   Lipid panel   Essential hypertension, benign    Well controlled, no changes to meds. Encouraged heart healthy diet such as the DASH diet and exercise as tolerated.       Relevant Orders   TSH   CBC   Comprehensive metabolic panel   Lipid panel   DUB (dysfunctional uterine bleeding)    Sees Dr Benjie Karvonen of GYN she is scheduled for endometrial Novasure Ablation soon for heavy, menstrual bleeding      Relevant Orders   TSH   CBC   Comprehensive metabolic panel   Lipid panel    Other Visit Diagnoses    Encounter for immunization    -  Primary       I am having Ms. Bellizzi maintain her L-Lysine, Biotin, multivitamin, Calcium-Vitamin D, folic acid, atenolol, SUMAtriptan, and rivaroxaban.  No orders of the defined types were placed in this encounter.     Penni Homans, MD

## 2015-07-09 ENCOUNTER — Encounter: Payer: Self-pay | Admitting: Hematology & Oncology

## 2015-07-09 ENCOUNTER — Other Ambulatory Visit (HOSPITAL_BASED_OUTPATIENT_CLINIC_OR_DEPARTMENT_OTHER): Payer: BLUE CROSS/BLUE SHIELD

## 2015-07-09 ENCOUNTER — Ambulatory Visit (HOSPITAL_BASED_OUTPATIENT_CLINIC_OR_DEPARTMENT_OTHER): Payer: BLUE CROSS/BLUE SHIELD | Admitting: Hematology & Oncology

## 2015-07-09 VITALS — BP 126/83 | HR 58 | Temp 97.5°F | Resp 16 | Ht 68.0 in | Wt 265.0 lb

## 2015-07-09 DIAGNOSIS — Z7901 Long term (current) use of anticoagulants: Secondary | ICD-10-CM | POA: Diagnosis not present

## 2015-07-09 DIAGNOSIS — I82591 Chronic embolism and thrombosis of other specified deep vein of right lower extremity: Secondary | ICD-10-CM | POA: Diagnosis not present

## 2015-07-09 DIAGNOSIS — I82401 Acute embolism and thrombosis of unspecified deep veins of right lower extremity: Secondary | ICD-10-CM

## 2015-07-09 DIAGNOSIS — I82441 Acute embolism and thrombosis of right tibial vein: Secondary | ICD-10-CM

## 2015-07-09 LAB — CBC WITH DIFFERENTIAL (CANCER CENTER ONLY)
BASO#: 0.1 10*3/uL (ref 0.0–0.2)
BASO%: 1 % (ref 0.0–2.0)
EOS%: 1.7 % (ref 0.0–7.0)
Eosinophils Absolute: 0.1 10*3/uL (ref 0.0–0.5)
HCT: 44.9 % (ref 34.8–46.6)
HEMOGLOBIN: 14.8 g/dL (ref 11.6–15.9)
LYMPH#: 2.3 10*3/uL (ref 0.9–3.3)
LYMPH%: 28.2 % (ref 14.0–48.0)
MCH: 28.7 pg (ref 26.0–34.0)
MCHC: 33 g/dL (ref 32.0–36.0)
MCV: 87 fL (ref 81–101)
MONO#: 0.8 10*3/uL (ref 0.1–0.9)
MONO%: 9.5 % (ref 0.0–13.0)
NEUT#: 4.8 10*3/uL (ref 1.5–6.5)
NEUT%: 59.6 % (ref 39.6–80.0)
Platelets: 235 10*3/uL (ref 145–400)
RBC: 5.16 10*6/uL (ref 3.70–5.32)
RDW: 14.4 % (ref 11.1–15.7)
WBC: 8.1 10*3/uL (ref 3.9–10.0)

## 2015-07-09 LAB — COMPREHENSIVE METABOLIC PANEL
ALBUMIN: 3.4 g/dL — AB (ref 3.5–5.0)
ALT: 17 U/L (ref 0–55)
ANION GAP: 6 meq/L (ref 3–11)
AST: 20 U/L (ref 5–34)
Alkaline Phosphatase: 86 U/L (ref 40–150)
BILIRUBIN TOTAL: 0.41 mg/dL (ref 0.20–1.20)
BUN: 25 mg/dL (ref 7.0–26.0)
CO2: 26 mEq/L (ref 22–29)
CREATININE: 0.9 mg/dL (ref 0.6–1.1)
Calcium: 9.2 mg/dL (ref 8.4–10.4)
Chloride: 108 mEq/L (ref 98–109)
EGFR: 76 mL/min/{1.73_m2} — AB (ref >90)
GLUCOSE: 97 mg/dL (ref 70–140)
Potassium: 4.3 mEq/L (ref 3.5–5.1)
Sodium: 140 mEq/L (ref 136–145)
TOTAL PROTEIN: 7.7 g/dL (ref 6.4–8.3)

## 2015-07-09 NOTE — Progress Notes (Signed)
Hematology and Oncology Follow Up Visit  Kim Fox [id number] 23-Jun-1965 50 y.o. 07/09/2015   Principle Diagnosis:   Right peroneal vein thrombus - chronic  Current Therapy:    Xarelto 20 mg by mouth daily-to finish April 2016  Xarelto 5 mg by mouth daily for maintenance therapy     Interim History:  [name] is back for follow-up. She is doing quite well. She had no proms over the holidays. She was asked at the beach for about 2 weeks. She is going back this weekend as it is her daughter's 20th birthday.  She's doing well with the Xarelto. She is having no problems with pain in the right leg. She as a chronic thrombus in the peroneal vein in the right leg. She is on maintenance Xarelto. I did this is very reasonable for her.  She's had no change in bowel or bladder habits.  She is getting her routine mammograms. Her last mammogram was in April 2016. She is due for another one this April.  She's had no leg swelling. She's had no joint problems.  She is still working. She is enjoying work.  Overall, her performance status is ECOG 0.  Medications:  Current outpatient prescriptions:  .  atenolol (TENORMIN) 50 MG tablet, Take 1 tablet (50 mg total) by mouth daily., Disp: 90 tablet, Rfl: 3 .  Biotin 5000 MCG CAPS, Take 1 capsule by mouth daily., Disp: , Rfl:  .  Calcium Carbonate-Vitamin D (CALCIUM-VITAMIN D) 600-200 MG-UNIT CAPS, Take 1 tablet by mouth 2 (two) times daily., Disp: , Rfl:  .  folic acid (FOLVITE) [id number] MCG tablet, Take 400 mcg by mouth daily., Disp: , Rfl:  .  L-Lysine 1000 MG TABS, Take 1 tablet by mouth daily., Disp: , Rfl:  .  Multiple Vitamin (MULTIVITAMIN) tablet, Take 1 tablet by mouth daily., Disp: , Rfl:  .  rivaroxaban (XARELTO) 10 MG TABS tablet, Take 1 tablet (10 mg total) by mouth daily with supper., Disp: 30 tablet, Rfl: 6 .  SUMAtriptan (IMITREX) 100 MG tablet, Take at the onset of HA.  May repeat in 2 hours if needed.  Max dose 2/24 hours,  4/week, Disp: 10 tablet, Rfl: 3  Allergies:  Allergies  Allergen Reactions  . Macrodantin Rash  . Penicillins Rash    Past Medical History, Surgical history, Social history, and Family History were reviewed and updated.  Review of Systems: As above  Physical Exam:  height is 5\' 8"  (1.727 m) and weight is 265 lb (120.203 kg). Her oral temperature is 97.5 F (36.4 C). Her blood pressure is 126/83 and her pulse is 58. Her respiration is 16.   Well-developed and well-nourished white female. Head and neck exam shows no ocular or oral lesions. There are no palpable cervical or supraclavicular lymph nodes. Lungs are clear. Cardiac exam regular rate and rhythm with no murmurs, rubs or bruits. Abdomen is soft. She is good bowel sounds. There is no fluid wave. There is no palpable liver or spleen tip. Back exam shows no tenderness over the spine, ribs or hips. Extremities shows no clubbing, cyanosis or edema. No venous cords noted in the legs. She is a negative Homans's sign. Skin exam shows no rashes, ecchymoses or petechia.  Lab Results  Component Value Date   WBC 8.1 07/09/2015   HGB 14.8 07/09/2015   HCT 44.9 07/09/2015   MCV 87 07/09/2015   PLT 235 07/09/2015     Chemistry      Component Value  Date/Time   NA 140 01/02/2015 1038   NA 137 10/03/2014 1110   K 4.9 01/02/2015 1038   K 4.7 10/03/2014 1110   CL 103 10/03/2014 1110   CO2 30* 01/02/2015 1038   CO2 28 10/03/2014 1110   BUN 19.1 01/02/2015 1038   BUN 22 10/03/2014 1110   CREATININE 0.8 01/02/2015 1038   CREATININE 0.77 10/03/2014 1110   CREATININE 0.79 08/29/2014 1330      Component Value Date/Time   CALCIUM 9.7 01/02/2015 1038   CALCIUM 9.6 10/03/2014 1110   ALKPHOS 70 10/03/2014 1110   AST 23 10/03/2014 1110   ALT 17 10/03/2014 1110   BILITOT 0.4 10/03/2014 1110         Impression and Plan: Kim Fox is 50 year old white female with a right peroneal vein thrombus. She has a normal hypercoagulable  panel.  I think that with a persistent thrombus, we will have to keep her on Xarelto indefinitely. Again, I think that low-dose  Xarelto at 5 mg daily is very reasonable.  I do not think that she needs any Dopplers. She's asymptomatic..  We will plan to get her back in 6 months.Volanda Napoleon, MD 1/18/20178:31 AM

## 2015-07-10 LAB — D-DIMER, QUANTITATIVE: D-DIMER: 0.56 mg/L FEU — ABNORMAL HIGH (ref 0.00–0.49)

## 2015-09-03 ENCOUNTER — Ambulatory Visit (INDEPENDENT_AMBULATORY_CARE_PROVIDER_SITE_OTHER): Payer: BLUE CROSS/BLUE SHIELD | Admitting: Medical

## 2015-09-03 ENCOUNTER — Encounter: Payer: Self-pay | Admitting: Medical

## 2015-09-03 VITALS — BP 110/80 | HR 71 | Temp 97.8°F | Ht 68.0 in | Wt 268.8 lb

## 2015-09-03 DIAGNOSIS — J0100 Acute maxillary sinusitis, unspecified: Secondary | ICD-10-CM

## 2015-09-03 DIAGNOSIS — R05 Cough: Secondary | ICD-10-CM | POA: Diagnosis not present

## 2015-09-03 DIAGNOSIS — R059 Cough, unspecified: Secondary | ICD-10-CM

## 2015-09-03 MED ORDER — DOXYCYCLINE HYCLATE 100 MG PO TABS: 100.0000 mg | ORAL_TABLET | Freq: Two times a day (BID) | ORAL | Status: DC

## 2015-09-03 MED ORDER — BENZONATATE 100 MG PO CAPS: 100.0000 mg | ORAL_CAPSULE | Freq: Three times a day (TID) | ORAL | Status: DC | PRN

## 2015-09-03 MED ORDER — FLUTICASONE PROPIONATE 50 MCG/ACT NA SUSP: 2.0000 | Freq: Every day | NASAL | Status: DC

## 2015-09-03 MED FILL — FLUTICASONE PROP 50 MCG SPR: 50 | 30 days supply | Qty: 16 | Fill #0

## 2015-09-03 MED FILL — DOXYCYCLINE 100 MG TABLET: 100 | 10 days supply | Qty: 20 | Fill #0

## 2015-09-03 MED FILL — BENZONATATE 100 MG CAPSULE: 100 | 7 days supply | Qty: 21 | Fill #0

## 2015-09-03 NOTE — Patient Instructions (Addendum)
You appear to have persisting sinus infection. I am prescribing  doxycycline antibiotic for the infection. To help with the nasal congestion I prescribed flonase nasal steroid. For your associated cough, I prescribed cough medicine benzonatate  Rest, hydrate, tylenol for fever. Since on antibiotics recently would go ahead and start probitoics. Would not last day of azithromycin.  Follow up in 7 days or as needed.

## 2015-09-03 NOTE — Progress Notes (Signed)
Pre visit review using our clinic review tool, if applicable. No additional management support is needed unless otherwise documented below in the visit note. 

## 2015-09-03 NOTE — Progress Notes (Signed)
Subjective:    Patient ID: Kim Fox, female    DOB: 16-Jul-1965, 50 y.o.   MRN: [id number]  HPI  Pt in for recent coughing for last couple of day. Occasional yellow/brown colored mucous.   This past Saturday had some nasal congestion, st,sinus pressure and teeth sensitive. [place]. Pt has been on z-pack last 5 days. Today would be last pill.   Pt states that despite the z-pack she has worse sinus pain and worse teeth pain.   Pt has been sneezing some last coupe of days and at the beginning.   No wheezing. No night time cough.      Review of Systems  Constitutional: Negative for fever, chills and fatigue.  HENT: Positive for congestion, sinus pressure and sore throat. Negative for ear pain and mouth sores.   Respiratory: Positive for cough. Negative for chest tightness, shortness of breath and wheezing.   Cardiovascular: Negative for chest pain and palpitations.  Musculoskeletal: Negative for myalgias.  Neurological: Negative for dizziness and headaches.  Hematological: Negative for adenopathy. Does not bruise/bleed easily.  Psychiatric/Behavioral: Negative for behavioral problems and confusion.   Past Medical History  Diagnosis Date  . Hypertension   . Headache(784.0)     migraine  . GERD (gastroesophageal reflux disease)   . Polycystic kidney disease   . Obesity   . Menorrhagia   . Murmur, cardiac     Echo WNL 2009  . I 04/04/2014  . Multiple renal cysts 08/09/2011  . History of chicken pox 04/10/2015  . Preventative health care 04/20/2015    Social History   Social History  . Marital Status: Divorced    Spouse Name: N/A  . Number of Children: 2  . 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 Smoking status: Never Smoker   . Smokeless tobacco: Never Used     Comment: never used tobacco    . Alcohol Use: 0.6 oz/week    1 Standard drinks or equivalent per week  . Drug Use: No  . Sexual Activity: Not Currently     Comment: lives with daughter and occasionally son, works in [place], no major dietary   Other Topics Concern  . Not on file   Social History Narrative    Past Surgical History  Procedure Laterality Date  . Tonsillectomy    . Ureteral stent placement  2002  . Kidney cyst removal  2002  . Endometrial biopsy  2010    Family History  Problem Relation Age of Onset  . Diabetes Mother     insulin dependent  . Hypertension Mother   . Migraines Mother   . Varicose Veins Mother   . Arthritis Mother   . Diabetes Father   . Hypertension Father   . Kidney disease Father   . Arthritis Father   . Migraines Sister   . Allergies Sister   . Renal Cyst Sister   . Cancer Sister     skin cancer  . Dysfunctional uterine bleeding Sister   . Cancer Paternal Grandmother     lung  . Cancer Paternal Grandfather     bladder, prostate  . Heart disease Paternal Grandfather   . Hyperlipidemia Daughter   . Fibromyalgia Sister   . Renal Cyst Sister   . Hypertension Sister   . Dysfunctional uterine bleeding  Sister     Allergies  Allergen Reactions  . Macrodantin Rash  . Penicillins Rash    Current Outpatient Prescriptions on File Prior to Visit  Medication Sig Dispense Refill  . atenolol (TENORMIN) 50 MG tablet Take 1 tablet (50 mg total) by mouth daily. 90 tablet 3  . Biotin 5000 MCG CAPS Take 1 capsule by mouth daily.    . Calcium Carbonate-Vitamin D (CALCIUM-VITAMIN D) 600-200 MG-UNIT CAPS Take 1 tablet by mouth 2 (two) times daily.    . folic acid (FOLVITE) Q000111Q MCG tablet Take 400 mcg by mouth daily.    Marland Kitchen L-Lysine 1000 MG TABS Take 1 tablet by mouth daily.    . Multiple Vitamin (MULTIVITAMIN) tablet Take 1 tablet by mouth daily.    . rivaroxaban (XARELTO) 10 MG TABS tablet Take 1 tablet (10 mg total) by mouth daily with supper. 30 tablet 6  . SUMAtriptan  (IMITREX) 100 MG tablet Take at the onset of HA.  May repeat in 2 hours if needed.  Max dose 2/24 hours, 4/week 10 tablet 3   No current facility-administered medications on file prior to visit.    BP 110/80 mmHg  Pulse 71  Temp(Src) 97.8 F (36.6 C) (Oral)  Ht 5\' 8"  (1.727 m)  Wt 268 lb 12.8 oz (121.927 kg)  BMI 40.88 kg/m2  SpO2 98%        Objective:   Physical Exam  General  Mental Status - Alert. General Appearance - Well groomed. Not in acute distress.  Skin Rashes- No Rashes.  HEENT Head- Normal. Ear Auditory Canal - Left- Normal. Right - Normal.Tympanic Membrane- Left- Normal. Right- Normal. Eye Sclera/Conjunctiva- Left- Normal. Right- Normal. Nose & Sinuses Nasal Mucosa- Left-  Boggy and Congested. Right-  Boggy and  Congested.Bilateral maxillary(worse maxillary) and frontal sinus pressure. Mouth & Throat Lips: Upper Lip- Normal: no dryness, cracking, pallor, cyanosis, or vesicular eruption. Lower Lip-Normal: no dryness, cracking, pallor, cyanosis or vesicular eruption. Buccal Mucosa- Bilateral- No Aphthous ulcers. Oropharynx- No Discharge or Erythema. Tonsils: Characteristics- Bilateral- No Erythema or Congestion. Size/Enlargement- Bilateral- No enlargement. Discharge- bilateral-None.  Neck Neck- Supple. No Masses.   Chest and Lung Exam Auscultation: Breath Sounds:-Clear even and unlabored.  Cardiovascular Auscultation:Rythm- Regular, rate and rhythm. Murmurs & Other Heart Sounds:Ausculatation of the heart reveal- No Murmurs.  Lymphatic Head & Neck General Head & Neck Lymphatics: Bilateral: Description- No Localized lymphadenopathy.        Assessment & Plan:  You appear to have a sinus infection. I am prescribing  doxycycline antibiotic for the infection. To help with the nasal congestion I prescribed flonase nasal steroid. For your associated cough, I prescribed cough medicine benzonatate  Rest, hydrate, tylenol for fever.  Follow up in 7  days or as needed.  Discussed with pharmacist how long zpack would be in pt system. 68 hour half life and may be in her system up for a week. Due to levofloxin/zpack potential cardiac reaction decided to use doxycycline

## 2015-09-11 ENCOUNTER — Telehealth: Payer: Self-pay | Admitting: Family Medicine

## 2015-09-11 DIAGNOSIS — I10 Essential (primary) hypertension: Secondary | ICD-10-CM

## 2015-09-11 MED ORDER — ATENOLOL 50 MG PO TABS: 50.0000 mg | ORAL_TABLET | Freq: Every day | ORAL | Status: DC

## 2015-09-11 NOTE — Telephone Encounter (Signed)
Pharmacy: [id number] - [place], [address] AT [address]  Reason for call: pt insurance requires 30 day supply for local pharmacy - pt needing atenolol. She has 4 days left.

## 2015-09-29 ENCOUNTER — Other Ambulatory Visit: Payer: Self-pay | Admitting: Family Medicine

## 2015-09-29 DIAGNOSIS — Z1231 Encounter for screening mammogram for malignant neoplasm of breast: Secondary | ICD-10-CM

## 2015-10-07 ENCOUNTER — Other Ambulatory Visit (INDEPENDENT_AMBULATORY_CARE_PROVIDER_SITE_OTHER): Payer: BLUE CROSS/BLUE SHIELD

## 2015-10-07 DIAGNOSIS — N938 Other specified abnormal uterine and vaginal bleeding: Secondary | ICD-10-CM

## 2015-10-07 DIAGNOSIS — G43809 Other migraine, not intractable, without status migrainosus: Secondary | ICD-10-CM | POA: Diagnosis not present

## 2015-10-07 DIAGNOSIS — I825Z1 Chronic embolism and thrombosis of unspecified deep veins of right distal lower extremity: Secondary | ICD-10-CM | POA: Diagnosis not present

## 2015-10-07 DIAGNOSIS — K219 Gastro-esophageal reflux disease without esophagitis: Secondary | ICD-10-CM

## 2015-10-07 DIAGNOSIS — E669 Obesity, unspecified: Secondary | ICD-10-CM

## 2015-10-07 DIAGNOSIS — Z8619 Personal history of other infectious and parasitic diseases: Secondary | ICD-10-CM

## 2015-10-07 DIAGNOSIS — Z0000 Encounter for general adult medical examination without abnormal findings: Secondary | ICD-10-CM

## 2015-10-07 DIAGNOSIS — I10 Essential (primary) hypertension: Secondary | ICD-10-CM

## 2015-10-07 DIAGNOSIS — Q6102 Congenital multiple renal cysts: Secondary | ICD-10-CM

## 2015-10-07 LAB — LIPID PANEL
CHOLESTEROL: 140 mg/dL (ref 0–200)
HDL: 44.7 mg/dL (ref >39.00)
LDL Cholesterol: 74 mg/dL (ref 0–99)
NonHDL: 95.52
Total CHOL/HDL Ratio: 3
Triglycerides: 109 mg/dL (ref 0.0–149.0)
VLDL: 21.8 mg/dL (ref 0.0–40.0)

## 2015-10-07 LAB — COMPREHENSIVE METABOLIC PANEL
ALBUMIN: 3.5 g/dL (ref 3.5–5.2)
ALT: 17 U/L (ref 0–35)
AST: 20 U/L (ref 0–37)
Alkaline Phosphatase: 61 U/L (ref 39–117)
BILIRUBIN TOTAL: 0.6 mg/dL (ref 0.2–1.2)
BUN: 17 mg/dL (ref 6–23)
CALCIUM: 9.1 mg/dL (ref 8.4–10.5)
CHLORIDE: 105 meq/L (ref 96–112)
CO2: 31 mEq/L (ref 19–32)
CREATININE: 0.83 mg/dL (ref 0.40–1.20)
GFR: 77.86 mL/min (ref >60.00)
Glucose, Bld: 98 mg/dL (ref 70–99)
Potassium: 4.5 mEq/L (ref 3.5–5.1)
Sodium: 140 mEq/L (ref 135–145)
TOTAL PROTEIN: 7 g/dL (ref 6.0–8.3)

## 2015-10-07 LAB — CBC
HCT: 43.4 % (ref 36.0–46.0)
Hemoglobin: 14.6 g/dL (ref 12.0–15.0)
MCHC: 33.6 g/dL (ref 30.0–36.0)
MCV: 85.8 fl (ref 78.0–100.0)
PLATELETS: 200 10*3/uL (ref 150.0–400.0)
RBC: 5.05 Mil/uL (ref 3.87–5.11)
RDW: 14.4 % (ref 11.5–15.5)
WBC: 7.3 10*3/uL (ref 4.0–10.5)

## 2015-10-07 LAB — TSH: TSH: 2.09 u[IU]/mL (ref 0.35–4.50)

## 2015-10-14 ENCOUNTER — Encounter: Payer: Self-pay | Admitting: Family Medicine

## 2015-10-14 ENCOUNTER — Ambulatory Visit (INDEPENDENT_AMBULATORY_CARE_PROVIDER_SITE_OTHER): Payer: BLUE CROSS/BLUE SHIELD | Admitting: Family Medicine

## 2015-10-14 VITALS — BP 112/68 | HR 60 | Temp 98.6°F | Ht 68.0 in | Wt 256.4 lb

## 2015-10-14 DIAGNOSIS — I824Z1 Acute embolism and thrombosis of unspecified deep veins of right distal lower extremity: Secondary | ICD-10-CM

## 2015-10-14 DIAGNOSIS — E669 Obesity, unspecified: Secondary | ICD-10-CM | POA: Diagnosis not present

## 2015-10-14 DIAGNOSIS — G43009 Migraine without aura, not intractable, without status migrainosus: Secondary | ICD-10-CM | POA: Diagnosis not present

## 2015-10-14 DIAGNOSIS — I10 Essential (primary) hypertension: Secondary | ICD-10-CM | POA: Diagnosis not present

## 2015-10-14 DIAGNOSIS — R3915 Urgency of urination: Secondary | ICD-10-CM | POA: Diagnosis not present

## 2015-10-14 DIAGNOSIS — R32 Unspecified urinary incontinence: Secondary | ICD-10-CM

## 2015-10-14 DIAGNOSIS — K219 Gastro-esophageal reflux disease without esophagitis: Secondary | ICD-10-CM

## 2015-10-14 DIAGNOSIS — N3942 Incontinence without sensory awareness: Secondary | ICD-10-CM

## 2015-10-14 DIAGNOSIS — Z0000 Encounter for general adult medical examination without abnormal findings: Secondary | ICD-10-CM

## 2015-10-14 DIAGNOSIS — N938 Other specified abnormal uterine and vaginal bleeding: Secondary | ICD-10-CM

## 2015-10-14 NOTE — Assessment & Plan Note (Signed)
Encouraged increased hydration, 64 ounces of clear fluids daily. Minimize alcohol and caffeine. Eat small frequent meals with lean proteins and complex carbs. Avoid high and low blood sugars. Get adequate sleep, 7-8 hours a night. Needs exercise daily preferably in the morning.  

## 2015-10-14 NOTE — Assessment & Plan Note (Signed)
Well controlled, no changes to meds. Encouraged heart healthy diet such as the DASH diet and exercise as tolerated.  °

## 2015-10-14 NOTE — Progress Notes (Signed)
Pre visit review using our clinic review tool, if applicable. No additional management support is needed unless otherwise documented below in the visit note. 

## 2015-10-14 NOTE — Assessment & Plan Note (Signed)
Infrequent episodes of incontinence, encouraged Kegel exercises and check a UA with c and s if no improvement she will let us know so we can proceed with further work up

## 2015-10-14 NOTE — Assessment & Plan Note (Signed)
Tolerating Xarelto, following with hematology

## 2015-10-14 NOTE — Assessment & Plan Note (Signed)
Has only occasional spotting now s/p ablation

## 2015-10-14 NOTE — Assessment & Plan Note (Signed)
Encouraged DASH diet, decrease po intake and increase exercise as tolerated. Needs 7-8 hours of sleep nightly. Avoid trans fats, eat small, frequent meals every 4-5 hours with lean proteins, complex carbs and healthy fats. Minimize simple carbs 

## 2015-10-14 NOTE — Assessment & Plan Note (Signed)
Avoid offending foods, start probiotics. Do not eat large meals in late evening and consider raising head of bed.  

## 2015-10-14 NOTE — Patient Instructions (Addendum)
Preventive Care for Adults, Female A healthy lifestyle and preventive care can promote health and wellness. Preventive health guidelines for women include the following key practices.  A routine yearly physical is a good way to check with your health care provider about your health and preventive screening. It is a chance to share any concerns and updates on your health and to receive a thorough exam.  Visit your dentist for a routine exam and preventive care every 6 months. Brush your teeth twice a day and floss once a day. Good oral hygiene prevents tooth decay and gum disease.  The frequency of eye exams is based on your age, health, family medical history, use of contact lenses, and other factors. Follow your health care provider's recommendations for frequency of eye exams.  Eat a healthy diet. Foods like vegetables, fruits, whole grains, low-fat dairy products, and lean protein foods contain the nutrients you need without too many calories. Decrease your intake of foods high in solid fats, added sugars, and salt. Eat the right amount of calories for you.Get information about a proper diet from your health care provider, if necessary.  Regular physical exercise is one of the most important things you can do for your health. Most adults should get at least 150 minutes of moderate-intensity exercise (any activity that increases your heart rate and causes you to sweat) each week. In addition, most adults need muscle-strengthening exercises on 2 or more days a week.  Maintain a healthy weight. The body mass index (BMI) is a screening tool to identify possible weight problems. It provides an estimate of body fat based on height and weight. Your health care provider can find your BMI and can help you achieve or maintain a healthy weight.For adults 20 years and older:  A BMI below 18.5 is considered underweight.  A BMI of 18.5 to 24.9 is normal.  A BMI of 25 to 29.9 is considered overweight.  A  BMI of 30 and above is considered obese.  Maintain normal blood lipids and cholesterol levels by exercising and minimizing your intake of saturated fat. Eat a balanced diet with plenty of fruit and vegetables. Blood tests for lipids and cholesterol should begin at age 45 and be repeated every 5 years. If your lipid or cholesterol levels are high, you are over 50, or you are at high risk for heart disease, you may need your cholesterol levels checked more frequently.Ongoing high lipid and cholesterol levels should be treated with medicines if diet and exercise are not working.  If you smoke, find out from your health care provider how to quit. If you do not use tobacco, do not start.  Lung cancer screening is recommended for adults aged 45-80 years who are at high risk for developing lung cancer because of a history of smoking. A yearly low-dose CT scan of the lungs is recommended for people who have at least a 30-pack-year history of smoking and are a current smoker or have quit within the past 15 years. A pack year of smoking is smoking an average of 1 pack of cigarettes a day for 1 year (for example: 1 pack a day for 30 years or 2 packs a day for 15 years). Yearly screening should continue until the smoker has stopped smoking for at least 15 years. Yearly screening should be stopped for people who develop a health problem that would prevent them from having lung cancer treatment.  If you are pregnant, do not drink alcohol. If you are  breastfeeding, be very cautious about drinking alcohol. If you are not pregnant and choose to drink alcohol, do not have more than 1 drink per day. One drink is considered to be 12 ounces (355 mL) of beer, 5 ounces (148 mL) of wine, or 1.5 ounces (44 mL) of liquor.  Avoid use of street drugs. Do not share needles with anyone. Ask for help if you need support or instructions about stopping the use of drugs.  High blood pressure causes heart disease and increases the risk  of stroke. Your blood pressure should be checked at least every 1 to 2 years. Ongoing high blood pressure should be treated with medicines if weight loss and exercise do not work.  If you are 55-79 years old, ask your health care provider if you should take aspirin to prevent strokes.  Diabetes screening is done by taking a blood sample to check your blood glucose level after you have not eaten for a certain period of time (fasting). If you are not overweight and you do not have risk factors for diabetes, you should be screened once every 3 years starting at age 45. If you are overweight or obese and you are 40-70 years of age, you should be screened for diabetes every year as part of your cardiovascular risk assessment.  Breast cancer screening is essential preventive care for women. You should practice "breast self-awareness." This means understanding the normal appearance and feel of your breasts and may include breast self-examination. Any changes detected, no matter how small, should be reported to a health care provider. Women in their 20s and 30s should have a clinical breast exam (CBE) by a health care provider as part of a regular health exam every 1 to 3 years. After age 40, women should have a CBE every year. Starting at age 40, women should consider having a mammogram (breast X-ray test) every year. Women who have a family history of breast cancer should talk to their health care provider about genetic screening. Women at a high risk of breast cancer should talk to their health care providers about having an MRI and a mammogram every year.  Breast cancer gene (BRCA)-related cancer risk assessment is recommended for women who have family members with BRCA-related cancers. BRCA-related cancers include breast, ovarian, tubal, and peritoneal cancers. Having family members with these cancers may be associated with an increased risk for harmful changes (mutations) in the breast cancer genes BRCA1 and  BRCA2. Results of the assessment will determine the need for genetic counseling and BRCA1 and BRCA2 testing.  Your health care provider may recommend that you be screened regularly for cancer of the pelvic organs (ovaries, uterus, and vagina). This screening involves a pelvic examination, including checking for microscopic changes to the surface of your cervix (Pap test). You may be encouraged to have this screening done every 3 years, beginning at age 21.  For women ages 30-65, health care providers may recommend pelvic exams and Pap testing every 3 years, or they may recommend the Pap and pelvic exam, combined with testing for human papilloma virus (HPV), every 5 years. Some types of HPV increase your risk of cervical cancer. Testing for HPV may also be done on women of any age with unclear Pap test results.  Other health care providers may not recommend any screening for nonpregnant women who are considered low risk for pelvic cancer and who do not have symptoms. Ask your health care provider if a screening pelvic exam is right for   you.  If you have had past treatment for cervical cancer or a condition that could lead to cancer, you need Pap tests and screening for cancer for at least 20 years after your treatment. If Pap tests have been discontinued, your risk factors (such as having a new sexual partner) need to be reassessed to determine if screening should resume. Some women have medical problems that increase the chance of getting cervical cancer. In these cases, your health care provider may recommend more frequent screening and Pap tests.  Colorectal cancer can be detected and often prevented. Most routine colorectal cancer screening begins at the age of 50 years and continues through age 75 years. However, your health care provider may recommend screening at an earlier age if you have risk factors for colon cancer. On a yearly basis, your health care provider may provide home test kits to check  for hidden blood in the stool. Use of a small camera at the end of a tube, to directly examine the colon (sigmoidoscopy or colonoscopy), can detect the earliest forms of colorectal cancer. Talk to your health care provider about this at age 50, when routine screening begins. Direct exam of the colon should be repeated every 5-10 years through age 75 years, unless early forms of precancerous polyps or small growths are found.  People who are at an increased risk for hepatitis B should be screened for this virus. You are considered at high risk for hepatitis B if:  You were born in a country where hepatitis B occurs often. Talk with your health care provider about which countries are considered high risk.  Your parents were born in a high-risk country and you have not received a shot to protect against hepatitis B (hepatitis B vaccine).  You have HIV or AIDS.  You use needles to inject street drugs.  You live with, or have sex with, someone who has hepatitis B.  You get hemodialysis treatment.  You take certain medicines for conditions like cancer, organ transplantation, and autoimmune conditions.  Hepatitis C blood testing is recommended for all people born from 1945 through 1965 and any individual with known risks for hepatitis C.  Practice safe sex. Use condoms and avoid high-risk sexual practices to reduce the spread of sexually transmitted infections (STIs). STIs include gonorrhea, chlamydia, syphilis, trichomonas, herpes, HPV, and human immunodeficiency virus (HIV). Herpes, HIV, and HPV are viral illnesses that have no cure. They can result in disability, cancer, and death.  You should be screened for sexually transmitted illnesses (STIs) including gonorrhea and chlamydia if:  You are sexually active and are younger than 24 years.  You are older than 24 years and your health care provider tells you that you are at risk for this type of infection.  Your sexual activity has changed  since you were last screened and you are at an increased risk for chlamydia or gonorrhea. Ask your health care provider if you are at risk.  If you are at risk of being infected with HIV, it is recommended that you take a prescription medicine daily to prevent HIV infection. This is called preexposure prophylaxis (PrEP). You are considered at risk if:  You are sexually active and do not regularly use condoms or know the HIV status of your partner(s).  You take drugs by injection.  You are sexually active with a partner who has HIV.  Talk with your health care provider about whether you are at high risk of being infected with HIV. If   you choose to begin PrEP, you should first be tested for HIV. You should then be tested every 3 months for as long as you are taking PrEP.  Osteoporosis is a disease in which the bones lose minerals and strength with aging. This can result in serious bone fractures or breaks. The risk of osteoporosis can be identified using a bone density scan. Women ages 67 years and over and women at risk for fractures or osteoporosis should discuss screening with their health care providers. Ask your health care provider whether you should take a calcium supplement or vitamin D to reduce the rate of osteoporosis.  Menopause can be associated with physical symptoms and risks. Hormone replacement therapy is available to decrease symptoms and risks. You should talk to your health care provider about whether hormone replacement therapy is right for you.  Use sunscreen. Apply sunscreen liberally and repeatedly throughout the day. You should seek shade when your shadow is shorter than you. Protect yourself by wearing long sleeves, pants, a wide-brimmed hat, and sunglasses year round, whenever you are outdoors.  Once a month, do a whole body skin exam, using a mirror to look at the skin on your back. Tell your health care provider of new moles, moles that have irregular borders, moles that  are larger than a pencil eraser, or moles that have changed in shape or color.  Stay current with required vaccines (immunizations).  Influenza vaccine. All adults should be immunized every year.  Tetanus, diphtheria, and acellular pertussis (Td, Tdap) vaccine. Pregnant women should receive 1 dose of Tdap vaccine during each pregnancy. The dose should be obtained regardless of the length of time since the last dose. Immunization is preferred during the 27th-36th week of gestation. An adult who has not previously received Tdap or who does not know her vaccine status should receive 1 dose of Tdap. This initial dose should be followed by tetanus and diphtheria toxoids (Td) booster doses every 10 years. Adults with an unknown or incomplete history of completing a 3-dose immunization series with Td-containing vaccines should begin or complete a primary immunization series including a Tdap dose. Adults should receive a Td booster every 10 years.  Varicella vaccine. An adult without evidence of immunity to varicella should receive 2 doses or a second dose if she has previously received 1 dose. Pregnant females who do not have evidence of immunity should receive the first dose after pregnancy. This first dose should be obtained before leaving the health care facility. The second dose should be obtained 4-8 weeks after the first dose.  Human papillomavirus (HPV) vaccine. Females aged 13-26 years who have not received the vaccine previously should obtain the 3-dose series. The vaccine is not recommended for use in pregnant females. However, pregnancy testing is not needed before receiving a dose. If a female is found to be pregnant after receiving a dose, no treatment is needed. In that case, the remaining doses should be delayed until after the pregnancy. Immunization is recommended for any person with an immunocompromised condition through the age of 61 years if she did not get any or all doses earlier. During the  3-dose series, the second dose should be obtained 4-8 weeks after the first dose. The third dose should be obtained 24 weeks after the first dose and 16 weeks after the second dose.  Zoster vaccine. One dose is recommended for adults aged 30 years or older unless certain conditions are present.  Measles, mumps, and rubella (MMR) vaccine. Adults born  before 1957 generally are considered immune to measles and mumps. Adults born in 1957 or later should have 1 or more doses of MMR vaccine unless there is a contraindication to the vaccine or there is laboratory evidence of immunity to each of the three diseases. A routine second dose of MMR vaccine should be obtained at least 28 days after the first dose for students attending postsecondary schools, health care workers, or international travelers. People who received inactivated measles vaccine or an unknown type of measles vaccine during 1963-1967 should receive 2 doses of MMR vaccine. People who received inactivated mumps vaccine or an unknown type of mumps vaccine before 1979 and are at high risk for mumps infection should consider immunization with 2 doses of MMR vaccine. For females of childbearing age, rubella immunity should be determined. If there is no evidence of immunity, females who are not pregnant should be vaccinated. If there is no evidence of immunity, females who are pregnant should delay immunization until after pregnancy. Unvaccinated health care workers born before 1957 who lack laboratory evidence of measles, mumps, or rubella immunity or laboratory confirmation of disease should consider measles and mumps immunization with 2 doses of MMR vaccine or rubella immunization with 1 dose of MMR vaccine.  Pneumococcal 13-valent conjugate (PCV13) vaccine. When indicated, a person who is uncertain of his immunization history and has no record of immunization should receive the PCV13 vaccine. All adults 65 years of age and older should receive this  vaccine. An adult aged 19 years or older who has certain medical conditions and has not been previously immunized should receive 1 dose of PCV13 vaccine. This PCV13 should be followed with a dose of pneumococcal polysaccharide (PPSV23) vaccine. Adults who are at high risk for pneumococcal disease should obtain the PPSV23 vaccine at least 8 weeks after the dose of PCV13 vaccine. Adults older than 49 years of age who have normal immune system function should obtain the PPSV23 vaccine dose at least 1 year after the dose of PCV13 vaccine.  Pneumococcal polysaccharide (PPSV23) vaccine. When PCV13 is also indicated, PCV13 should be obtained first. All adults aged 65 years and older should be immunized. An adult younger than age 65 years who has certain medical conditions should be immunized. Any person who resides in a nursing home or long-term care facility should be immunized. An adult smoker should be immunized. People with an immunocompromised condition and certain other conditions should receive both PCV13 and PPSV23 vaccines. People with human immunodeficiency virus (HIV) infection should be immunized as soon as possible after diagnosis. Immunization during chemotherapy or radiation therapy should be avoided. Routine use of PPSV23 vaccine is not recommended for American Indians, [place] Natives, or people younger than 65 years unless there are medical conditions that require PPSV23 vaccine. When indicated, people who have unknown immunization and have no record of immunization should receive PPSV23 vaccine. One-time revaccination 5 years after the first dose of PPSV23 is recommended for people aged 19-64 years who have chronic kidney failure, nephrotic syndrome, asplenia, or immunocompromised conditions. People who received 1-2 doses of PPSV23 before age 65 years should receive another dose of PPSV23 vaccine at age 65 years or later if at least 5 years have passed since the previous dose. Doses of PPSV23 are not  needed for people immunized with PPSV23 at or after age 65 years.  Meningococcal vaccine. Adults with asplenia or persistent complement component deficiencies should receive 2 doses of quadrivalent meningococcal conjugate (MenACWY-D) vaccine. The doses should be obtained   at least 2 months apart. Microbiologists working with certain meningococcal bacteria, [place] recruits, people at risk during an outbreak, and people who travel to or live in countries with a high rate of meningitis should be immunized. A first-year college student up through age 34 years who is living in a residence hall should receive a dose if she did not receive a dose on or after her 16th birthday. Adults who have certain high-risk conditions should receive one or more doses of vaccine.  Hepatitis A vaccine. Adults who wish to be protected from this disease, have certain high-risk conditions, work with hepatitis A-infected animals, work in hepatitis A research labs, or travel to or work in countries with a high rate of hepatitis A should be immunized. Adults who were previously unvaccinated and who anticipate close contact with an international adoptee during the first 60 days after arrival in the [place] from a country with a high rate of hepatitis A should be immunized.  Hepatitis B vaccine. Adults who wish to be protected from this disease, have certain high-risk conditions, may be exposed to blood or other infectious body fluids, are household contacts or sex partners of hepatitis B positive people, are clients or workers in certain care facilities, or travel to or work in countries with a high rate of hepatitis B should be immunized.  Haemophilus influenzae type b (Hib) vaccine. A previously unvaccinated person with asplenia or sickle cell disease or having a scheduled splenectomy should receive 1 dose of Hib vaccine. Regardless of previous immunization, a recipient of a hematopoietic stem cell transplant should receive a  3-dose series 6-12 months after her successful transplant. Hib vaccine is not recommended for adults with HIV infection. Preventive Services / Frequency Ages 35 to 4 years  Blood pressure check.** / Every 3-5 years.  Lipid and cholesterol check.** / Every 5 years beginning at age 60.  Clinical breast exam.** / Every 3 years for women in their 71s and 10s.  BRCA-related cancer risk assessment.** / For women who have family members with a BRCA-related cancer (breast, ovarian, tubal, or peritoneal cancers).  Pap test.** / Every 2 years from ages 76 through 26. Every 3 years starting at age 61 through age 76 or 93 with a history of 3 consecutive normal Pap tests.  HPV screening.** / Every 3 years from ages 37 through ages 60 to 51 with a history of 3 consecutive normal Pap tests.  Hepatitis C blood test.** / For any individual with known risks for hepatitis C.  Skin self-exam. / Monthly.  Influenza vaccine. / Every year.  Tetanus, diphtheria, and acellular pertussis (Tdap, Td) vaccine.** / Consult your health care provider. Pregnant women should receive 1 dose of Tdap vaccine during each pregnancy. 1 dose of Td every 10 years.  Varicella vaccine.** / Consult your health care provider. Pregnant females who do not have evidence of immunity should receive the first dose after pregnancy.  HPV vaccine. / 3 doses over 6 months, if 93 and younger. The vaccine is not recommended for use in pregnant females. However, pregnancy testing is not needed before receiving a dose.  Measles, mumps, rubella (MMR) vaccine.** / You need at least 1 dose of MMR if you were born in 1957 or later. You may also need a 2nd dose. For females of childbearing age, rubella immunity should be determined. If there is no evidence of immunity, females who are not pregnant should be vaccinated. If there is no evidence of immunity, females who are  pregnant should delay immunization until after pregnancy.  Pneumococcal  13-valent conjugate (PCV13) vaccine.** / Consult your health care provider.  Pneumococcal polysaccharide (PPSV23) vaccine.** / 1 to 2 doses if you smoke cigarettes or if you have certain conditions.  Meningococcal vaccine.** / 1 dose if you are age 68 to 8 years and a Market researcher living in a residence hall, or have one of several medical conditions, you need to get vaccinated against meningococcal disease. You may also need additional booster doses.  Hepatitis A vaccine.** / Consult your health care provider.  Hepatitis B vaccine.** / Consult your health care provider.  Haemophilus influenzae type b (Hib) vaccine.** / Consult your health care provider. Ages 7 to 53 years  Blood pressure check.** / Every year.  Lipid and cholesterol check.** / Every 5 years beginning at age 25 years.  Lung cancer screening. / Every year if you are aged 11-80 years and have a 30-pack-year history of smoking and currently smoke or have quit within the past 15 years. Yearly screening is stopped once you have quit smoking for at least 15 years or develop a health problem that would prevent you from having lung cancer treatment.  Clinical breast exam.** / Every year after age 48 years.  BRCA-related cancer risk assessment.** / For women who have family members with a BRCA-related cancer (breast, ovarian, tubal, or peritoneal cancers).  Mammogram.** / Every year beginning at age 41 years and continuing for as long as you are in good health. Consult with your health care provider.  Pap test.** / Every 3 years starting at age 65 years through age 37 or 70 years with a history of 3 consecutive normal Pap tests.  HPV screening.** / Every 3 years from ages 72 years through ages 60 to 40 years with a history of 3 consecutive normal Pap tests.  Fecal occult blood test (FOBT) of stool. / Every year beginning at age 21 years and continuing until age 5 years. You may not need to do this test if you get  a colonoscopy every 10 years.  Flexible sigmoidoscopy or colonoscopy.** / Every 5 years for a flexible sigmoidoscopy or every 10 years for a colonoscopy beginning at age 35 years and continuing until age 48 years.  Hepatitis C blood test.** / For all people born from [birth year] through 1965 and any individual with known risks for hepatitis C.  Skin self-exam. / Monthly.  Influenza vaccine. / Every year.  Tetanus, diphtheria, and acellular pertussis (Tdap/Td) vaccine.** / Consult your health care provider. Pregnant women should receive 1 dose of Tdap vaccine during each pregnancy. 1 dose of Td every 10 years.  Varicella vaccine.** / Consult your health care provider. Pregnant females who do not have evidence of immunity should receive the first dose after pregnancy.  Zoster vaccine.** / 1 dose for adults aged 30 years or older.  Measles, mumps, rubella (MMR) vaccine.** / You need at least 1 dose of MMR if you were born in 1957 or later. You may also need a second dose. For females of childbearing age, rubella immunity should be determined. If there is no evidence of immunity, females who are not pregnant should be vaccinated. If there is no evidence of immunity, females who are pregnant should delay immunization until after pregnancy.  Pneumococcal 13-valent conjugate (PCV13) vaccine.** / Consult your health care provider.  Pneumococcal polysaccharide (PPSV23) vaccine.** / 1 to 2 doses if you smoke cigarettes or if you have certain conditions.  Meningococcal vaccine.** /  Consult your health care provider.  Hepatitis A vaccine.** / Consult your health care provider.  Hepatitis B vaccine.** / Consult your health care provider.  Haemophilus influenzae type b (Hib) vaccine.** / Consult your health care provider. Ages 64 years and over  Blood pressure check.** / Every year.  Lipid and cholesterol check.** / Every 5 years beginning at age 23 years.  Lung cancer screening. / Every year if you  are aged 16-80 years and have a 30-pack-year history of smoking and currently smoke or have quit within the past 15 years. Yearly screening is stopped once you have quit smoking for at least 15 years or develop a health problem that would prevent you from having lung cancer treatment.  Clinical breast exam.** / Every year after age 74 years.  BRCA-related cancer risk assessment.** / For women who have family members with a BRCA-related cancer (breast, ovarian, tubal, or peritoneal cancers).  Mammogram.** / Every year beginning at age 44 years and continuing for as long as you are in good health. Consult with your health care provider.  Pap test.** / Every 3 years starting at age 58 years through age 22 or 39 years with 3 consecutive normal Pap tests. Testing can be stopped between 65 and 70 years with 3 consecutive normal Pap tests and no abnormal Pap or HPV tests in the past 10 years.  HPV screening.** / Every 3 years from ages 64 years through ages 70 or 61 years with a history of 3 consecutive normal Pap tests. Testing can be stopped between 65 and 70 years with 3 consecutive normal Pap tests and no abnormal Pap or HPV tests in the past 10 years.  Fecal occult blood test (FOBT) of stool. / Every year beginning at age 40 years and continuing until age 27 years. You may not need to do this test if you get a colonoscopy every 10 years.  Flexible sigmoidoscopy or colonoscopy.** / Every 5 years for a flexible sigmoidoscopy or every 10 years for a colonoscopy beginning at age 7 years and continuing until age 32 years.  Hepatitis C blood test.** / For all people born from [birth year] through 1964 and any individual with known risks for hepatitis C.  Osteoporosis screening.** / A one-time screening for women ages 30 years and over and women at risk for fractures or osteoporosis.  Skin self-exam. / Monthly.  Influenza vaccine. / Every year.  Tetanus, diphtheria, and acellular pertussis (Tdap/Td)  vaccine.** / 1 dose of Td every 10 years.  Varicella vaccine.** / Consult your health care provider.  Zoster vaccine.** / 1 dose for adults aged 35 years or older.  Pneumococcal 13-valent conjugate (PCV13) vaccine.** / Consult your health care provider.  Pneumococcal polysaccharide (PPSV23) vaccine.** / 1 dose for all adults aged 46 years and older.  Meningococcal vaccine.** / Consult your health care provider.  Hepatitis A vaccine.** / Consult your health care provider.  Hepatitis B vaccine.** / Consult your health care provider.  Haemophilus influenzae type b (Hib) vaccine.** / Consult your health care provider. ** Family history and personal history of risk and conditions may change your health care provider's recommendations.   This information is not intended to replace advice given to you by your health care provider. Make sure you discuss any questions you have with your health care provider.   Document Released: 08/03/2001 Document Revised: 06/28/2014 Document Reviewed: 11/02/2010 Elsevier Interactive Patient Education [employer].

## 2015-10-14 NOTE — Progress Notes (Signed)
Subjective:    Patient ID: Kim Fox, female    DOB: 03-22-1966, 50 y.o.   MRN: [id number]  Chief Complaint  Patient presents with  . Annual Exam    HPI Patient is in today for Annual Physical Exam.  Patient reports having some concerns with urinary incontinence.  Denies any flank pain, abdominal pain or dysuria. No other recent illness or acute concerns. Denies CP/palp/SOB/HA/congestion/fevers/GI or c/o. Taking meds as prescribed  Past Medical History  Diagnosis Date  . Hypertension   . Headache(784.0)     migraine  . GERD (gastroesophageal reflux disease)   . Polycystic kidney disease   . Obesity   . Menorrhagia   . Murmur, cardiac     Echo WNL 2009  . I 04/04/2014  . Multiple renal cysts 08/09/2011  . History of chicken pox 04/10/2015  . Preventative health care 04/20/2015  . Urinary incontinence 10/28/2015    Past Surgical History  Procedure Laterality Date  . Tonsillectomy    . Ureteral stent placement  2002  . Kidney cyst removal  2002  . Endometrial biopsy  2010    Family History  Problem Relation Age of Onset  . Diabetes Mother     insulin dependent  . Hypertension Mother   . Migraines Mother   . Varicose Veins Mother   . Arthritis Mother   . Diabetes Father   . Hypertension Father   . Kidney disease Father   . Arthritis Father   . Migraines Sister   . Allergies Sister   . Renal Cyst Sister   . Cancer Sister     skin cancer  . Dysfunctional uterine bleeding Sister   . Cancer Paternal Grandmother     lung  . Cancer Paternal Grandfather     bladder, prostate  . Heart disease Paternal Grandfather   . Hyperlipidemia Daughter   . Fibromyalgia Sister   . Renal Cyst Sister   . Hypertension Sister   . Dysfunctional uterine bleeding Sister     Social History   Social History  . Marital Status: Divorced    Spouse Name: N/A  . Number of Children: 2  . Years of Education: college   Occupational History  . Barrister's clerk [employer]   Social History Main Topics  . Smoking status: Never Smoker   . Smokeless tobacco: Never Used     Comment: never used tobacco  . Alcohol Use: 0.6 oz/week    1 Standard drinks or equivalent per week  . Drug Use: No  . Sexual Activity: Not Currently     Comment: lives with daughter and occasionally son, works in [place], no major dietary   Other Topics Concern  . Not on file   Social History Narrative    Outpatient Prescriptions Prior to Visit  Medication Sig Dispense Refill  . atenolol (TENORMIN) 50 MG tablet Take 1 tablet (50 mg total) by mouth daily. 30 tablet 5  . Biotin 5000 MCG CAPS Take 1 capsule by mouth daily.    . Calcium Carbonate-Vitamin D (CALCIUM-VITAMIN D) 600-200 MG-UNIT CAPS Take 1 tablet by mouth 2 (two) times daily.    . fluticasone (FLONASE) 50 MCG/ACT nasal spray Place 2 sprays into both nostrils daily. 16 g 1  . folic acid (FOLVITE) 829 MCG tablet Take 400 mcg by mouth daily.    Marland Kitchen L-Lysine 1000 MG TABS Take 1 tablet by mouth daily.    . Multiple Vitamin (MULTIVITAMIN) tablet Take 1 tablet by mouth  daily.    . rivaroxaban (XARELTO) 10 MG TABS tablet Take 1 tablet (10 mg total) by mouth daily with supper. 30 tablet 6  . SUMAtriptan (IMITREX) 100 MG tablet Take at the onset of HA.  May repeat in 2 hours if needed.  Max dose 2/24 hours, 4/week 10 tablet 3  . benzonatate (TESSALON) 100 MG capsule Take 1 capsule (100 mg total) by mouth 3 (three) times daily as needed. 21 capsule 0  . doxycycline (VIBRA-TABS) 100 MG tablet Take 1 tablet (100 mg total) by mouth 2 (two) times daily. 20 tablet 0   No facility-administered medications prior to visit.    Allergies  Allergen Reactions  . Macrodantin Rash  . Penicillins Rash    Review of Systems  Constitutional: Negative for fever, chills and malaise/fatigue.  HENT: Negative for congestion and hearing loss.   Eyes: Negative for discharge.  Respiratory: Negative for cough, sputum production and shortness of  breath.   Cardiovascular: Negative for chest pain, palpitations and leg swelling.  Gastrointestinal: Negative for heartburn, nausea, vomiting, abdominal pain, diarrhea, constipation and blood in stool.  Genitourinary: Positive for urgency and frequency. Negative for dysuria and hematuria.  Musculoskeletal: Negative for myalgias, back pain and falls.  Skin: Negative for rash.  Neurological: Negative for dizziness, sensory change, loss of consciousness, weakness and headaches.  Endo/Heme/Allergies: Negative for environmental allergies. Does not bruise/bleed easily.  Psychiatric/Behavioral: Negative for depression and suicidal ideas. The patient is not nervous/anxious and does not have insomnia.        Objective:    Physical Exam  Constitutional: She is oriented to person, place, and time. She appears well-developed and well-nourished. No distress.  HENT:  Head: Normocephalic and atraumatic.  Eyes: Conjunctivae are normal.  Neck: Neck supple. No thyromegaly present.  Cardiovascular: Normal rate and regular rhythm.   Murmur heard. Pulmonary/Chest: Effort normal and breath sounds normal. No respiratory distress.  Abdominal: Soft. Bowel sounds are normal. She exhibits no distension and no mass. There is no tenderness.  Musculoskeletal: She exhibits no edema.  Lymphadenopathy:    She has no cervical adenopathy.  Neurological: She is alert and oriented to person, place, and time.  Skin: Skin is warm and dry.  Psychiatric: She has a normal mood and affect. Her behavior is normal.    BP 112/68 mmHg  Pulse 60  Temp(Src) 98.6 F (37 C) (Oral)  Ht '5\' 8"'  (1.727 m)  Wt 256 lb 6 oz (116.291 kg)  BMI 38.99 kg/m2  SpO2 94% Wt Readings from Last 3 Encounters:  10/14/15 256 lb 6 oz (116.291 kg)  09/03/15 268 lb 12.8 oz (121.927 kg)  07/09/15 265 lb (120.203 kg)     Lab Results  Component Value Date   WBC 7.3 10/07/2015   HGB 14.6 10/07/2015   HCT 43.4 10/07/2015   PLT 200.0 10/07/2015    GLUCOSE 98 10/07/2015   CHOL 140 10/07/2015   TRIG 109.0 10/07/2015   HDL 44.70 10/07/2015   LDLCALC 74 10/07/2015   ALT 17 10/07/2015   AST 20 10/07/2015   NA 140 10/07/2015   K 4.5 10/07/2015   CL 105 10/07/2015   CREATININE 0.83 10/07/2015   BUN 17 10/07/2015   CO2 31 10/07/2015   TSH 2.09 10/07/2015   INR 1.03 03/25/2014    Lab Results  Component Value Date   TSH 2.09 10/07/2015   Lab Results  Component Value Date   WBC 7.3 10/07/2015   HGB 14.6 10/07/2015   HCT 43.4  10/07/2015   MCV 85.8 10/07/2015   PLT 200.0 10/07/2015   Lab Results  Component Value Date   NA 140 10/07/2015   K 4.5 10/07/2015   CHLORIDE 108 07/09/2015   CO2 31 10/07/2015   GLUCOSE 98 10/07/2015   BUN 17 10/07/2015   CREATININE 0.83 10/07/2015   BILITOT 0.6 10/07/2015   ALKPHOS 61 10/07/2015   AST 20 10/07/2015   ALT 17 10/07/2015   PROT 7.0 10/07/2015   ALBUMIN 3.5 10/07/2015   CALCIUM 9.1 10/07/2015   ANIONGAP 6 07/09/2015   EGFR 76* 07/09/2015   GFR 77.86 10/07/2015   Lab Results  Component Value Date   CHOL 140 10/07/2015   Lab Results  Component Value Date   HDL 44.70 10/07/2015   Lab Results  Component Value Date   LDLCALC 74 10/07/2015   Lab Results  Component Value Date   TRIG 109.0 10/07/2015   Lab Results  Component Value Date   CHOLHDL 3 10/07/2015   No results found for: HGBA1C     Assessment & Plan:   Problem List Items Addressed This Visit    Urinary incontinence    Encouraged kegel exercises and monitor      Relevant Orders   Urinalysis   Urine culture (Completed)   CBC   Comprehensive metabolic panel   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 Consider daily probiotics. Take medications as prescribed. Labs reviewed today, MGM this month. Given and reviewed copy of ACP documents from [facility] and encouraged to complete and return      Relevant Orders   CBC   Comprehensive metabolic panel   Obesity    Encouraged DASH diet, decrease po intake and increase exercise as tolerated. Needs 7-8 hours of sleep nightly. Avoid trans fats, eat small, frequent meals every 4-5 hours with lean proteins, complex carbs and healthy fats. Minimize simple carbs      Relevant Orders   CBC   Comprehensive metabolic panel   Migraine    Encouraged increased hydration, 64 ounces of clear fluids daily. Minimize alcohol and caffeine. Eat small frequent meals with lean proteins and complex carbs. Avoid high and low blood sugars. Get adequate sleep, 7-8 hours a night. Needs exercise daily preferably in the morning.      Relevant Orders   CBC   Comprehensive metabolic panel   GERD (gastroesophageal reflux disease) - Primary    Avoid offending foods, start probiotics. Do not eat large meals in late evening and consider raising head of bed.       Relevant Orders   CBC   Comprehensive metabolic panel   Essential hypertension, benign    Well controlled, no changes to meds. Encouraged heart healthy diet such as the DASH diet and exercise as tolerated.       Relevant Orders   CBC   Comprehensive metabolic panel   DUB (dysfunctional uterine bleeding)    Has only occasional spotting now s/p ablation      Absence of bladder continence    Infrequent episodes of incontinence, encouraged Kegel exercises and check a UA with c and s if no improvement she will let us know so we can proceed with further work up      Relevant Orders   Urinalysis   Urine culture (Completed)   CBC  Comprehensive metabolic panel    Other Visit Diagnoses    Urinary urgency        Relevant Orders    Urinalysis    Urine culture (Completed)    CBC    Comprehensive metabolic panel       I have discontinued Ms. Heavrin doxycycline and benzonatate. I am also having her maintain her L-Lysine, Biotin,  multivitamin, Calcium-Vitamin D, folic acid, SUMAtriptan, rivaroxaban, fluticasone, and atenolol.  No orders of the defined types were placed in this encounter.     Penni Homans, MD

## 2015-10-14 NOTE — Assessment & Plan Note (Addendum)
Patient encouraged to maintain heart healthy diet, regular exercise, adequate sleep. Consider daily probiotics. Take medications as prescribed. Labs reviewed today, MGM this month. Given and reviewed copy of ACP documents from [facility] [employer] and encouraged to complete and return

## 2015-10-15 LAB — URINALYSIS, ROUTINE W REFLEX MICROSCOPIC
Bilirubin Urine: NEGATIVE
Hgb urine dipstick: NEGATIVE
Ketones, ur: NEGATIVE
Nitrite: NEGATIVE
SPECIFIC GRAVITY, URINE: 1.015 (ref 1.000–1.030)
Total Protein, Urine: NEGATIVE
Urine Glucose: NEGATIVE
Urobilinogen, UA: 0.2 (ref 0.0–1.0)
pH: 5.5 (ref 5.0–8.0)

## 2015-10-16 ENCOUNTER — Ambulatory Visit (HOSPITAL_BASED_OUTPATIENT_CLINIC_OR_DEPARTMENT_OTHER)
Admission: RE | Admit: 2015-10-16 | Discharge: 2015-10-16 | Disposition: A | Discharge status: Home / Self Care | Payer: BLUE CROSS/BLUE SHIELD | Source: Ambulatory Visit | Attending: Family Medicine | Admitting: Family Medicine

## 2015-10-16 DIAGNOSIS — Z1231 Encounter for screening mammogram for malignant neoplasm of breast: Secondary | ICD-10-CM | POA: Diagnosis present

## 2015-10-16 LAB — URINE CULTURE
COLONY COUNT: NO GROWTH
Organism ID, Bacteria: NO GROWTH

## 2015-10-17 IMAGING — US US EXTREM LOW VENOUS*R*
1 series · 13 of 24 positions shown · non-contrast
Comparison: None.

CLINICAL DATA: Right lower extremity pain, edema and erythema.



[Series 1: us extrem low venous*right* · 0.10mm/px · 13 of 26 slices shown]
[im 1/26]
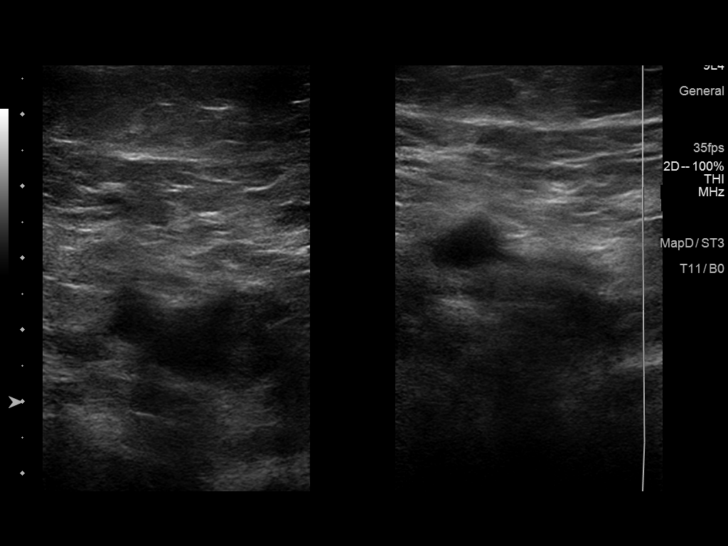
[im 3/26]
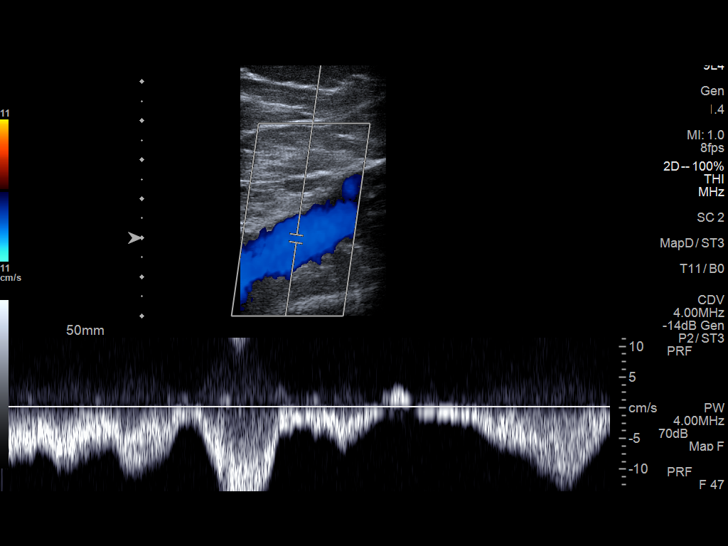
[im 5/26]
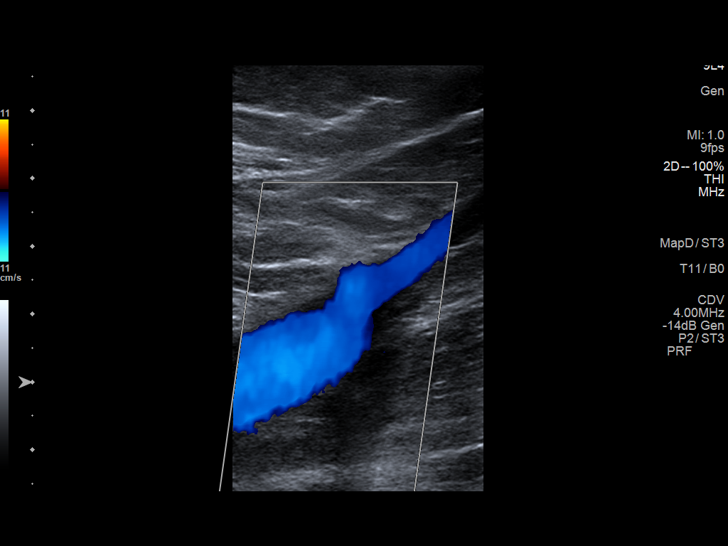
[im 7/26]
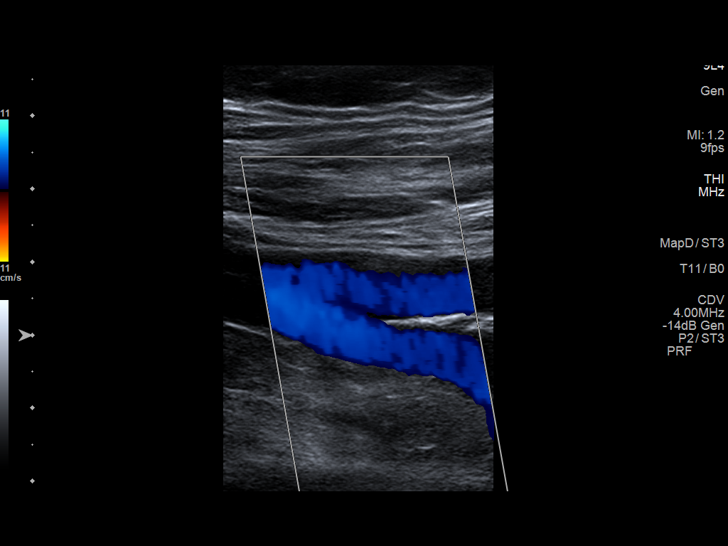
[im 9/26]
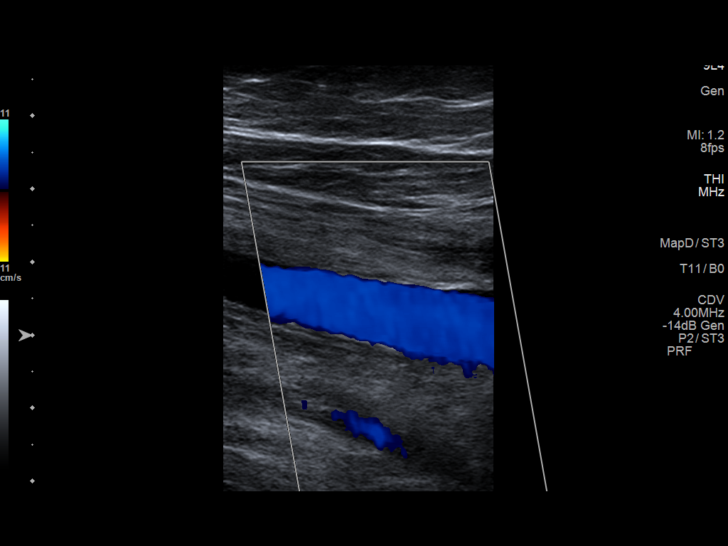
[im 11/26]
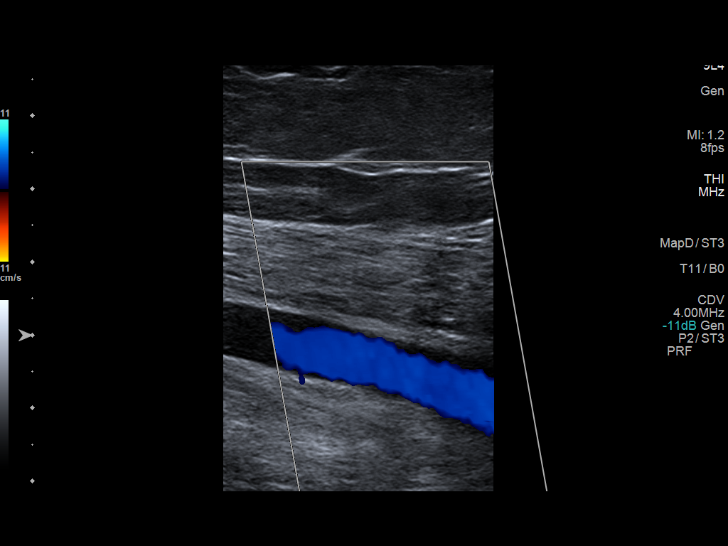
[im 14/26]
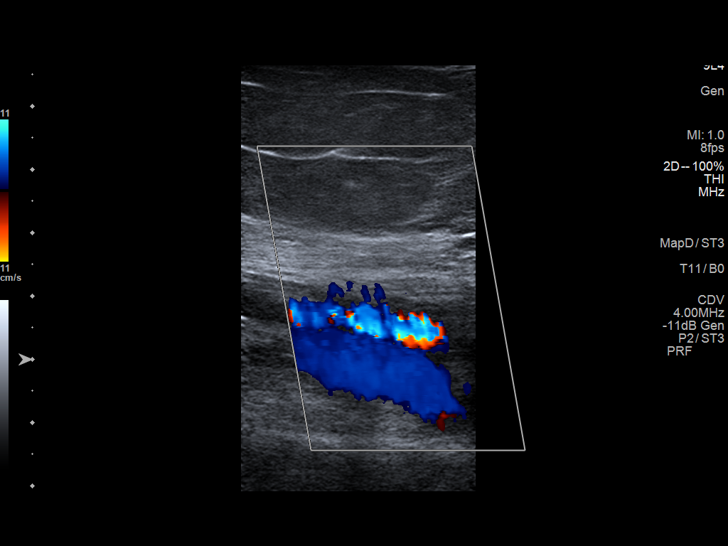
[im 15/26]
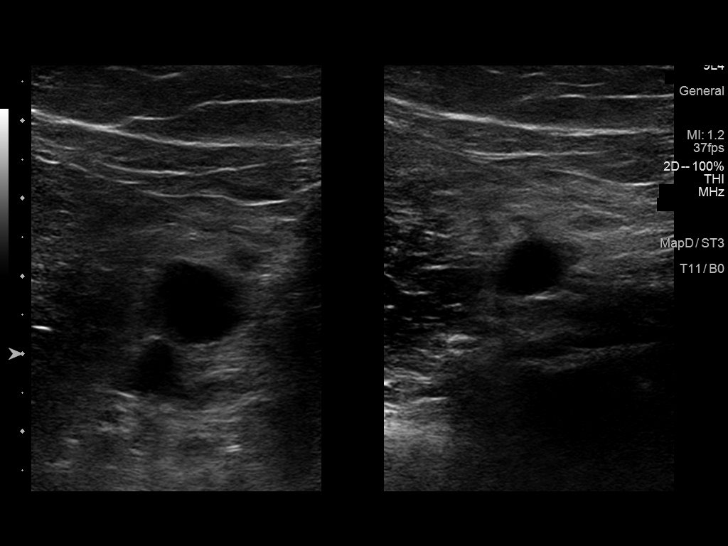
[im 17/26]
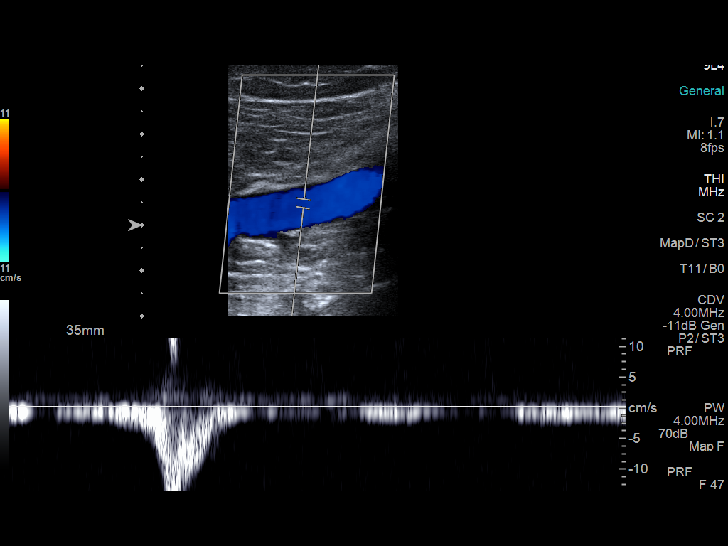
[im 19/26]
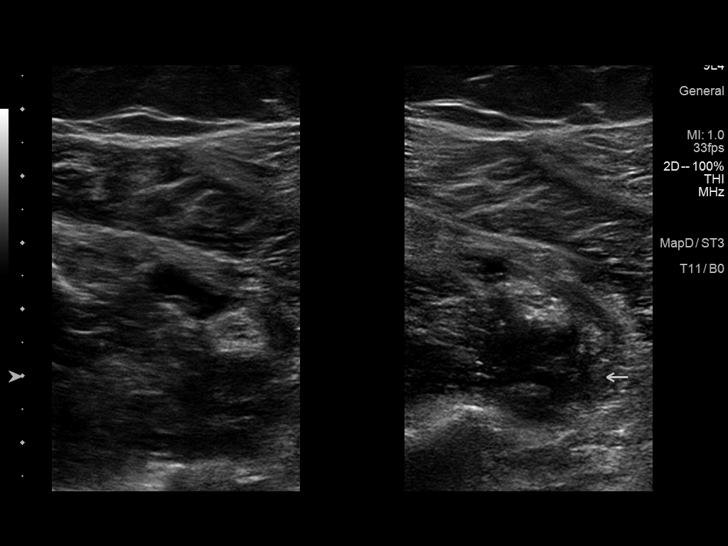
[im 21/26]
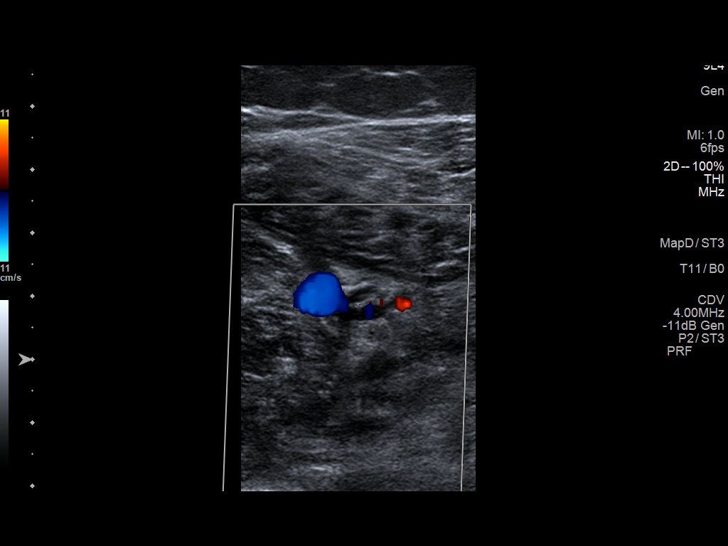
[im 23/26]
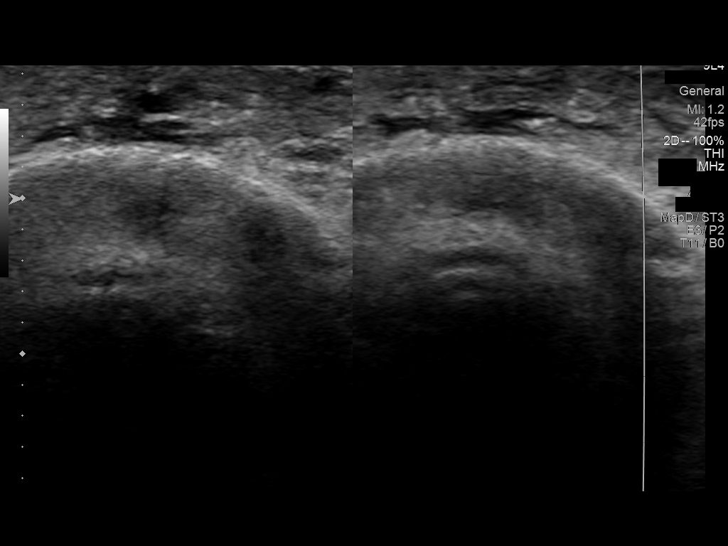
[im 26/26]
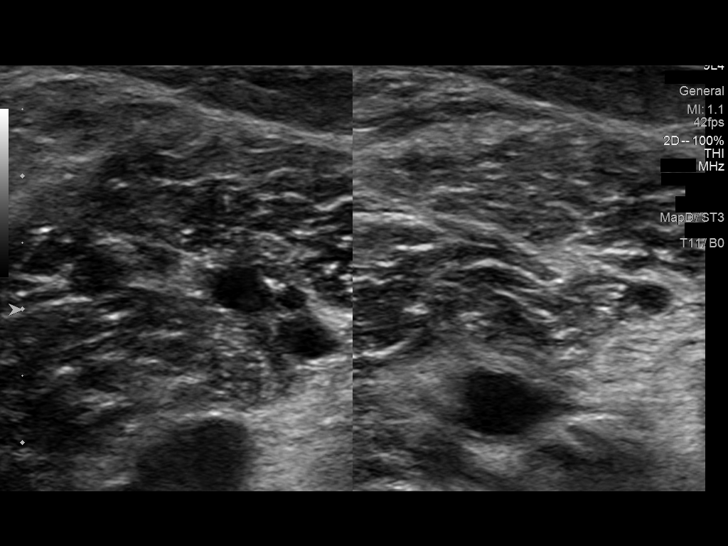

[13 of 24 positions shown; findings below may reference images not displayed]

FINDINGS: Common Femoral Vein: No evidence of thrombus. Normal
compressibility, respiratory phasicity and response to augmentation.

Saphenofemoral Junction: No evidence of thrombus. Normal
compressibility and flow on color Doppler imaging.

Profunda Femoral Vein: No evidence of thrombus. Normal
compressibility and flow on color Doppler imaging.

Femoral Vein: No evidence of thrombus. Normal compressibility,
respiratory phasicity and response to augmentation.

Popliteal Vein: No evidence of thrombus. Normal compressibility,
respiratory phasicity and response to augmentation.

Calf Veins: There is occlusive thrombus localizing to the right
peroneal vein in the calf. Other visualize calf veins are normally
patent.

Superficial Great Saphenous Vein: No evidence of thrombus. Normal
compressibility and flow on color Doppler imaging.

Venous Reflux:  None.

Other Findings: No evidence of superficial thrombophlebitis or
abnormal fluid collection.
IMPRESSION: Positive for DVT in the right peroneal vein of the calf.

## 2015-10-28 ENCOUNTER — Encounter: Payer: Self-pay | Admitting: Family Medicine

## 2015-10-28 DIAGNOSIS — R32 Unspecified urinary incontinence: Secondary | ICD-10-CM

## 2015-10-28 HISTORY — DX: Unspecified urinary incontinence: R32

## 2015-10-28 NOTE — Assessment & Plan Note (Signed)
Encouraged kegel exercises and monitor 

## 2016-01-06 ENCOUNTER — Ambulatory Visit (HOSPITAL_BASED_OUTPATIENT_CLINIC_OR_DEPARTMENT_OTHER): Payer: BLUE CROSS/BLUE SHIELD | Admitting: Family

## 2016-01-06 ENCOUNTER — Ambulatory Visit (HOSPITAL_BASED_OUTPATIENT_CLINIC_OR_DEPARTMENT_OTHER)
Admission: RE | Admit: 2016-01-06 | Discharge: 2016-01-06 | Disposition: A | Discharge status: Home / Self Care | Payer: BLUE CROSS/BLUE SHIELD | Source: Ambulatory Visit | Attending: Family | Admitting: Family

## 2016-01-06 ENCOUNTER — Other Ambulatory Visit (HOSPITAL_BASED_OUTPATIENT_CLINIC_OR_DEPARTMENT_OTHER): Payer: BLUE CROSS/BLUE SHIELD

## 2016-01-06 ENCOUNTER — Encounter: Payer: Self-pay | Admitting: Family

## 2016-01-06 VITALS — BP 127/71 | HR 55 | Temp 97.6°F | Resp 18 | Ht 68.0 in | Wt 247.1 lb

## 2016-01-06 DIAGNOSIS — M79604 Pain in right leg: Secondary | ICD-10-CM

## 2016-01-06 DIAGNOSIS — M79662 Pain in left lower leg: Secondary | ICD-10-CM | POA: Diagnosis not present

## 2016-01-06 DIAGNOSIS — I82531 Chronic embolism and thrombosis of right popliteal vein: Secondary | ICD-10-CM | POA: Diagnosis not present

## 2016-01-06 DIAGNOSIS — I82441 Acute embolism and thrombosis of right tibial vein: Secondary | ICD-10-CM

## 2016-01-06 LAB — COMPREHENSIVE METABOLIC PANEL
ALT: 16 U/L (ref 0–55)
ANION GAP: 8 meq/L (ref 3–11)
AST: 21 U/L (ref 5–34)
Albumin: 3.3 g/dL — ABNORMAL LOW (ref 3.5–5.0)
Alkaline Phosphatase: 82 U/L (ref 40–150)
BUN: 20.9 mg/dL (ref 7.0–26.0)
CHLORIDE: 106 meq/L (ref 98–109)
CO2: 27 meq/L (ref 22–29)
Calcium: 9.2 mg/dL (ref 8.4–10.4)
Creatinine: 0.8 mg/dL (ref 0.6–1.1)
EGFR: 83 mL/min/{1.73_m2} — AB (ref >90)
Glucose: 97 mg/dl (ref 70–140)
Potassium: 4.8 mEq/L (ref 3.5–5.1)
Sodium: 140 mEq/L (ref 136–145)
Total Bilirubin: 0.38 mg/dL (ref 0.20–1.20)
Total Protein: 7.5 g/dL (ref 6.4–8.3)

## 2016-01-06 LAB — CBC WITH DIFFERENTIAL (CANCER CENTER ONLY)
BASO#: 0.1 10*3/uL (ref 0.0–0.2)
BASO%: 0.6 % (ref 0.0–2.0)
EOS ABS: 0.1 10*3/uL (ref 0.0–0.5)
EOS%: 1.4 % (ref 0.0–7.0)
HCT: 44.8 % (ref 34.8–46.6)
HGB: 15.1 g/dL (ref 11.6–15.9)
LYMPH#: 2.5 10*3/uL (ref 0.9–3.3)
LYMPH%: 24.4 % (ref 14.0–48.0)
MCH: 29.6 pg (ref 26.0–34.0)
MCHC: 33.7 g/dL (ref 32.0–36.0)
MCV: 88 fL (ref 81–101)
MONO#: 0.7 10*3/uL (ref 0.1–0.9)
MONO%: 7.1 % (ref 0.0–13.0)
NEUT#: 6.8 10*3/uL — ABNORMAL HIGH (ref 1.5–6.5)
NEUT%: 66.5 % (ref 39.6–80.0)
PLATELETS: 249 10*3/uL (ref 145–400)
RBC: 5.1 10*6/uL (ref 3.70–5.32)
RDW: 14 % (ref 11.1–15.7)
WBC: 10.2 10*3/uL — ABNORMAL HIGH (ref 3.9–10.0)

## 2016-01-06 MED ORDER — RIVAROXABAN 10 MG PO TABS: 10.0000 mg | ORAL_TABLET | Freq: Every day | ORAL | Status: DC

## 2016-01-06 NOTE — Progress Notes (Signed)
Hematology and Oncology Follow Up Visit  Lylia Warrens [id number] [birth year] 50 y.o. 01/06/2016   Principle Diagnosis:  Right peroneal vein thrombus - chronic  Current Therapy:   Xarelto 20 mg by mouth daily - finished April 2016 Xarelto 5 mg by mouth daily for maintenance therapy    Interim History:  Ms. Braccio is here today for a follow-up. She is having some "tightness" and tenderness in the inner portion of her left calf. She is on low dose Xarelto at 5 mg daily. She ridden to the beach twice this summer but took breaks to get out and walk. She is staying well hydrated.  No swelling, numbness or tingling in her extremities. She has had no episodes of bleeding or bruising.  She has maintained a good appetite and is staying well hydrated. Her weight is stable.  No fever, chills, n/v, cough, rash, dizziness, headaches, blurred vision, SOB, chest pain, palpitations, abdominal pain, constipation, diarrhea, blood in urine or stool.  No lymphadenopathy found on exam.  No changes in her appetite. She is staying hydrated. Her weight is stable.   Medications:    Medication List       This list is accurate as of: 01/06/16  9:54 AM.  Always use your most recent med list.               Acidophilus/Goat Milk Caps  Take by mouth.     atenolol 50 MG tablet  Commonly known as:  TENORMIN  Take 1 tablet (50 mg total) by mouth daily.     Biotin 5000 MCG Caps  Take 1 capsule by mouth daily.     Calcium-Vitamin D 600-200 MG-UNIT Caps  Take 1 tablet by mouth 2 (two) times daily.     folic acid [id number] MCG tablet  Commonly known as:  FOLVITE  Take 400 mcg by mouth daily.     L-Lysine 1000 MG Tabs  Take 1 tablet by mouth daily.     multivitamin tablet  Take 1 tablet by mouth daily.     rivaroxaban 10 MG Tabs tablet  Commonly known as:  XARELTO  Take 1 tablet (10 mg total) by mouth daily with supper.     SUMAtriptan 100 MG tablet  Commonly known as:  IMITREX  Take at the onset  of HA.  May repeat in 2 hours if needed.  Max dose 2/24 hours, 4/week        Allergies:  Allergies  Allergen Reactions  . Macrodantin Rash  . Penicillins Rash    Past Medical History, Surgical history, Social history, and Family History were reviewed and updated.  Review of Systems: All other 10 point review of systems is negative.   Physical Exam:  height is 5\' 8"  (1.727 m) and weight is 247 lb 1.3 oz (112.075 kg). Her oral temperature is 97.6 F (36.4 C). Her blood pressure is 127/71 and her pulse is 55. Her respiration is 18.   Wt Readings from Last 3 Encounters:  01/06/16 247 lb 1.3 oz (112.075 kg)  10/14/15 256 lb 6 oz (116.291 kg)  09/03/15 268 lb 12.8 oz (121.927 kg)    Ocular: Sclerae unicteric, pupils equal, round and reactive to light Ear-nose-throat: Oropharynx clear, dentition fair Lymphatic: No cervical supraclavicular or axillary adenopathy Lungs no rales or rhonchi, good excursion bilaterally Heart regular rate and rhythm, no murmur appreciated Abd soft, nontender, positive bowel sounds, no liver or spleen tip palpated on exam MSK no focal spinal tenderness, no joint edema Neuro:  non-focal, well-oriented, appropriate affect Breasts: Deferred  Lab Results  Component Value Date   WBC 10.2* 01/06/2016   HGB 15.1 01/06/2016   HCT 44.8 01/06/2016   MCV 88 01/06/2016   PLT 249 01/06/2016   No results found for: FERRITIN, IRON, TIBC, UIBC, IRONPCTSAT Lab Results  Component Value Date   RBC 5.10 01/06/2016   No results found for: KPAFRELGTCHN, LAMBDASER, KAPLAMBRATIO No results found for: IGGSERUM, IGA, IGMSERUM No results found for: Odetta Pink, SPEI   Chemistry      Component Value Date/Time   NA 140 10/07/2015 0731   NA 140 07/09/2015 0807   K 4.5 10/07/2015 0731   K 4.3 07/09/2015 0807   CL 105 10/07/2015 0731   CO2 31 10/07/2015 0731   CO2 26 07/09/2015 0807   BUN 17 10/07/2015 0731    BUN 25.0 07/09/2015 0807   CREATININE 0.83 10/07/2015 0731   CREATININE 0.9 07/09/2015 0807   CREATININE 0.79 08/29/2014 1330      Component Value Date/Time   CALCIUM 9.1 10/07/2015 0731   CALCIUM 9.2 07/09/2015 0807   ALKPHOS 61 10/07/2015 0731   ALKPHOS 86 07/09/2015 0807   AST 20 10/07/2015 0731   AST 20 07/09/2015 0807   ALT 17 10/07/2015 0731   ALT 17 07/09/2015 0807   BILITOT 0.6 10/07/2015 0731   BILITOT 0.41 07/09/2015 0807     Impression and Plan: Ms. Westhoff is 50 year old white female with a right peroneal vein thrombus. 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 She has had no episodes of bleeding or bruising.  She has now developed "tightness" and tenderness in her left calf. Her doppler study today was negative for DVT. She will follow-up with her PCP if her discomfort continues.  We will have her continue on Xarelto 5 mg daily.  We will plan to see her back in 6 months for labs and follow-up. We will also repeat an [place] on her right leg that day.  She knows to call here with any questions or concerns. We can certainly see her sooner if need be.   Eliezer Bottom, NP 7/18/20179:54 AM

## 2016-01-07 LAB — D-DIMER, QUANTITATIVE: D-DIMER: 0.44 mg/L FEU (ref 0.00–0.49)

## 2016-03-08 ENCOUNTER — Other Ambulatory Visit: Payer: Self-pay | Admitting: Family Medicine

## 2016-03-08 DIAGNOSIS — I10 Essential (primary) hypertension: Secondary | ICD-10-CM

## 2016-03-08 MED ORDER — ATENOLOL 50 MG PO TABS: 50.0000 mg | ORAL_TABLET | Freq: Every day | ORAL | 5 refills | Status: DC

## 2016-07-06 ENCOUNTER — Telehealth: Payer: Self-pay | Admitting: Family Medicine

## 2016-07-06 ENCOUNTER — Other Ambulatory Visit (HOSPITAL_BASED_OUTPATIENT_CLINIC_OR_DEPARTMENT_OTHER): Payer: BLUE CROSS/BLUE SHIELD

## 2016-07-06 ENCOUNTER — Ambulatory Visit (HOSPITAL_BASED_OUTPATIENT_CLINIC_OR_DEPARTMENT_OTHER): Payer: BLUE CROSS/BLUE SHIELD | Admitting: Family

## 2016-07-06 VITALS — BP 133/68 | HR 58 | Temp 97.7°F | Resp 16 | Wt 240.0 lb

## 2016-07-06 DIAGNOSIS — Z7901 Long term (current) use of anticoagulants: Secondary | ICD-10-CM | POA: Diagnosis not present

## 2016-07-06 DIAGNOSIS — I82531 Chronic embolism and thrombosis of right popliteal vein: Secondary | ICD-10-CM | POA: Diagnosis not present

## 2016-07-06 DIAGNOSIS — I825Z1 Chronic embolism and thrombosis of unspecified deep veins of right distal lower extremity: Secondary | ICD-10-CM

## 2016-07-06 DIAGNOSIS — M79604 Pain in right leg: Secondary | ICD-10-CM | POA: Diagnosis not present

## 2016-07-06 DIAGNOSIS — I10 Essential (primary) hypertension: Secondary | ICD-10-CM

## 2016-07-06 LAB — CBC WITH DIFFERENTIAL (CANCER CENTER ONLY)
BASO#: 0.1 10*3/uL (ref 0.0–0.2)
BASO%: 0.6 % (ref 0.0–2.0)
EOS%: 1.9 % (ref 0.0–7.0)
Eosinophils Absolute: 0.2 10*3/uL (ref 0.0–0.5)
HCT: 45.1 % (ref 34.8–46.6)
HGB: 15.2 g/dL (ref 11.6–15.9)
LYMPH#: 2.7 10*3/uL (ref 0.9–3.3)
LYMPH%: 32.1 % (ref 14.0–48.0)
MCH: 29.6 pg (ref 26.0–34.0)
MCHC: 33.7 g/dL (ref 32.0–36.0)
MCV: 88 fL (ref 81–101)
MONO#: 0.6 10*3/uL (ref 0.1–0.9)
MONO%: 7.5 % (ref 0.0–13.0)
NEUT#: 5 10*3/uL (ref 1.5–6.5)
NEUT%: 57.9 % (ref 39.6–80.0)
PLATELETS: 221 10*3/uL (ref 145–400)
RBC: 5.14 10*6/uL (ref 3.70–5.32)
RDW: 14 % (ref 11.1–15.7)
WBC: 8.5 10*3/uL (ref 3.9–10.0)

## 2016-07-06 LAB — COMPREHENSIVE METABOLIC PANEL
ALT: 16 U/L (ref 0–55)
ANION GAP: 7 meq/L (ref 3–11)
AST: 20 U/L (ref 5–34)
Albumin: 3.4 g/dL — ABNORMAL LOW (ref 3.5–5.0)
Alkaline Phosphatase: 87 U/L (ref 40–150)
BUN: 18.8 mg/dL (ref 7.0–26.0)
CALCIUM: 9.1 mg/dL (ref 8.4–10.4)
CHLORIDE: 107 meq/L (ref 98–109)
CO2: 26 meq/L (ref 22–29)
Creatinine: 0.9 mg/dL (ref 0.6–1.1)
EGFR: 80 mL/min/{1.73_m2} — AB (ref >90)
Glucose: 96 mg/dl (ref 70–140)
POTASSIUM: 4.3 meq/L (ref 3.5–5.1)
Sodium: 140 mEq/L (ref 136–145)
Total Bilirubin: 0.71 mg/dL (ref 0.20–1.20)
Total Protein: 7.6 g/dL (ref 6.4–8.3)

## 2016-07-06 MED ORDER — ATENOLOL 50 MG PO TABS: 50.0000 mg | ORAL_TABLET | Freq: Every day | ORAL | 1 refills | Status: DC

## 2016-07-06 NOTE — Progress Notes (Signed)
Hematology and Oncology Follow Up Visit  Kim Fox [id number] 06-30-1965 51 y.o. 07/06/2016   Principle Diagnosis:  Right peroneal vein thrombus - chronic    Current Therapy:   Xarelto 20 mg by mouth daily - finished April 2016 Xarelto 5 mg by mouth daily for maintenance therapy - stopped today (07/06/16) and will start 2 baby aspirin daily    Interim History:  Kim Fox is here today for a follow-up. She is doing quite well and has no complaints at this time. She has had no episodes of postphlebitic pain. No swelling, tenderness, numbness or tingling in her extremities.  No episodes of bleeding or bruising while on Xarelto. She has now been on anticoagulation for 3 years.  She has maintained a good appetite and is making sure to staying well hydrated. Her weight is stable.  No fever, chills, n/v, cough, rash, dizziness, headaches, blurred vision, SOB, chest pain, palpitations, abdominal pain, constipation, diarrhea, blood in urine or stool.  No lymphadenopathy found on exam.   Medications:  Allergies as of 07/06/2016      Reactions   Macrodantin Rash   Penicillins Rash      Medication List       Accurate as of 07/06/16  8:58 AM. Always use your most recent med list.          Acidophilus/Goat Milk Caps Take by mouth.   atenolol 50 MG tablet Commonly known as:  TENORMIN Take 1 tablet (50 mg total) by mouth daily.   Biotin 5000 MCG Caps Take 1 capsule by mouth daily.   Calcium-Vitamin D 600-200 MG-UNIT Caps Take 1 tablet by mouth 2 (two) times daily.   folic acid [id number] MCG tablet Commonly known as:  FOLVITE Take 400 mcg by mouth daily.   L-Lysine 1000 MG Tabs Take 1 tablet by mouth daily.   multivitamin tablet Take 1 tablet by mouth daily.   rivaroxaban 10 MG Tabs tablet Commonly known as:  XARELTO Take 1 tablet (10 mg total) by mouth daily with supper.   SUMAtriptan 100 MG tablet Commonly known as:  IMITREX Take at the onset of HA.  May repeat in 2  hours if needed.  Max dose 2/24 hours, 4/week       Allergies:  Allergies  Allergen Reactions  . Macrodantin Rash  . Penicillins Rash    Past Medical History, Surgical history, Social history, and Family History were reviewed and updated.  Review of Systems: All other 10 point review of systems is negative.   Physical Exam:  vitals were not taken for this visit.  Wt Readings from Last 3 Encounters:  01/06/16 247 lb 1.3 oz (112.1 kg)  10/14/15 256 lb 6 oz (116.3 kg)  09/03/15 268 lb 12.8 oz (121.9 kg)    Ocular: Sclerae unicteric, pupils equal, round and reactive to light Ear-nose-throat: Oropharynx clear, dentition fair Lymphatic: No cervical supraclavicular or axillary adenopathy Lungs no rales or rhonchi, good excursion bilaterally Heart regular rate and rhythm, no murmur appreciated Abd soft, nontender, positive bowel sounds, no liver or spleen tip palpated on exam, no fluid wave MSK no focal spinal tenderness, no joint edema Neuro: non-focal, well-oriented, appropriate affect Breasts: Deferred  Lab Results  Component Value Date   WBC 8.5 07/06/2016   HGB 15.2 07/06/2016   HCT 45.1 07/06/2016   MCV 88 07/06/2016   PLT 221 07/06/2016   No results found for: FERRITIN, IRON, TIBC, UIBC, IRONPCTSAT Lab Results  Component Value Date   RBC 5.14 07/06/2016  No results found for: KPAFRELGTCHN, LAMBDASER, KAPLAMBRATIO No results found for: IGGSERUM, IGA, IGMSERUM No results found for: Odetta Pink, SPEI   Chemistry      Component Value Date/Time   NA 140 01/06/2016 0927   K 4.8 01/06/2016 0927   CL 105 10/07/2015 0731   CO2 27 01/06/2016 0927   BUN 20.9 01/06/2016 0927   CREATININE 0.8 01/06/2016 0927      Component Value Date/Time   CALCIUM 9.2 01/06/2016 0927   ALKPHOS 82 01/06/2016 0927   AST 21 01/06/2016 0927   ALT 16 01/06/2016 0927   BILITOT 0.38 01/06/2016 0927     Impression and Plan: 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 She is currently on a maintenance dose of Xarelto at 5 mg PO daily. No episodes of bleeding or bruising.  We will have her stop the Xarelto and start taking 2 baby aspirin by mouth daily with food.  We will plan to see her back in 3 months for repeat lab work and follow-up. We will also repeat an [place] on her right leg that day.  She knows to call here with any questions or concerns. We can certainly see her sooner if need be.   Eliezer Bottom, NP 1/16/20188:58 AM

## 2016-07-06 NOTE — Telephone Encounter (Signed)
Relation to [id number] Call back number:[phone] Pharmacy: [facility], [address] AT [place] [phone] (Phone) [phone] (Fax)     Reason for call:  Patient requesting a 90 day supply due to insurance changing: atenolol (TENORMIN) 50 MG tablet

## 2016-07-06 NOTE — Telephone Encounter (Signed)
Sent in as requested and patient informed

## 2016-07-07 LAB — D-DIMER, QUANTITATIVE (NOT AT ARMC): D-DIMER: 0.57 mg{FEU}/L — AB (ref 0.00–0.49)

## 2016-10-04 ENCOUNTER — Telehealth: Payer: Self-pay | Admitting: Family Medicine

## 2016-10-04 DIAGNOSIS — R7989 Other specified abnormal findings of blood chemistry: Secondary | ICD-10-CM

## 2016-10-04 NOTE — Telephone Encounter (Signed)
°  Relation to DH:DIXB Call back number:[phone]  Reason for call:  atient has a scheduled lab appointment for 10/07/16 within our office and a lab appointment with Eliezer Bottom, NP and would like to know if we can draw the labs from oncologist (chart reflects), please advise

## 2016-10-04 NOTE — Telephone Encounter (Signed)
OK to order Onc labs. D dimer for elevated D dimer or abnormal dimer, other labs for HTN

## 2016-10-04 NOTE — Telephone Encounter (Signed)
ordered

## 2016-10-05 NOTE — Telephone Encounter (Signed)
Patient is getting labs at Dr. Antonieta Pert on 4/19--her question is can she only be stuck once and results/be shared.  If so what else/if any needs to be added? D dimer has been added.

## 2016-10-05 NOTE — Telephone Encounter (Signed)
The labs oncology has future ordered are already in the labs I have ordered. Oncology can see what we have drawn so she should be covered

## 2016-10-06 ENCOUNTER — Other Ambulatory Visit: Payer: BLUE CROSS/BLUE SHIELD

## 2016-10-06 ENCOUNTER — Ambulatory Visit: Payer: BLUE CROSS/BLUE SHIELD | Admitting: Hematology & Oncology

## 2016-10-06 NOTE — Telephone Encounter (Signed)
Patient informed. 

## 2016-10-07 ENCOUNTER — Other Ambulatory Visit (HOSPITAL_BASED_OUTPATIENT_CLINIC_OR_DEPARTMENT_OTHER): Payer: BLUE CROSS/BLUE SHIELD

## 2016-10-07 ENCOUNTER — Other Ambulatory Visit: Payer: Self-pay | Admitting: Family Medicine

## 2016-10-07 ENCOUNTER — Ambulatory Visit (HOSPITAL_BASED_OUTPATIENT_CLINIC_OR_DEPARTMENT_OTHER): Payer: BLUE CROSS/BLUE SHIELD | Admitting: Hematology & Oncology

## 2016-10-07 ENCOUNTER — Other Ambulatory Visit: Payer: BLUE CROSS/BLUE SHIELD

## 2016-10-07 VITALS — BP 134/73 | HR 58 | Temp 98.1°F | Resp 16 | Wt 242.4 lb

## 2016-10-07 DIAGNOSIS — I825Z1 Chronic embolism and thrombosis of unspecified deep veins of right distal lower extremity: Secondary | ICD-10-CM

## 2016-10-07 DIAGNOSIS — Z1231 Encounter for screening mammogram for malignant neoplasm of breast: Secondary | ICD-10-CM

## 2016-10-07 LAB — CBC WITH DIFFERENTIAL (CANCER CENTER ONLY)
BASO#: 0.1 10*3/uL (ref 0.0–0.2)
BASO%: 0.8 % (ref 0.0–2.0)
EOS ABS: 0.2 10*3/uL (ref 0.0–0.5)
EOS%: 2.9 % (ref 0.0–7.0)
HCT: 46.2 % (ref 34.8–46.6)
HEMOGLOBIN: 15.6 g/dL (ref 11.6–15.9)
LYMPH#: 2.4 10*3/uL (ref 0.9–3.3)
LYMPH%: 30 % (ref 14.0–48.0)
MCH: 30.2 pg (ref 26.0–34.0)
MCHC: 33.8 g/dL (ref 32.0–36.0)
MCV: 89 fL (ref 81–101)
MONO#: 0.8 10*3/uL (ref 0.1–0.9)
MONO%: 9.9 % (ref 0.0–13.0)
NEUT%: 56.4 % (ref 39.6–80.0)
NEUTROS ABS: 4.5 10*3/uL (ref 1.5–6.5)
Platelets: 233 10*3/uL (ref 145–400)
RBC: 5.17 10*6/uL (ref 3.70–5.32)
RDW: 13.7 % (ref 11.1–15.7)
WBC: 7.9 10*3/uL (ref 3.9–10.0)

## 2016-10-07 LAB — COMPREHENSIVE METABOLIC PANEL
ALBUMIN: 3.4 g/dL — AB (ref 3.5–5.0)
ALK PHOS: 76 U/L (ref 40–150)
ALT: 16 U/L (ref 0–55)
AST: 19 U/L (ref 5–34)
Anion Gap: 8 mEq/L (ref 3–11)
BILIRUBIN TOTAL: 0.4 mg/dL (ref 0.20–1.20)
BUN: 23.2 mg/dL (ref 7.0–26.0)
CALCIUM: 9.4 mg/dL (ref 8.4–10.4)
CO2: 25 mEq/L (ref 22–29)
CREATININE: 0.9 mg/dL (ref 0.6–1.1)
Chloride: 108 mEq/L (ref 98–109)
EGFR: 80 mL/min/{1.73_m2} — ABNORMAL LOW (ref >90)
GLUCOSE: 101 mg/dL (ref 70–140)
POTASSIUM: 4.3 meq/L (ref 3.5–5.1)
SODIUM: 141 meq/L (ref 136–145)
TOTAL PROTEIN: 7.4 g/dL (ref 6.4–8.3)

## 2016-10-07 NOTE — Progress Notes (Signed)
Hematology and Oncology Follow Up Visit  Kim Fox [id number] [birth year] 51 y.o. 10/07/2016   Principle Diagnosis:  Right peroneal vein thrombus - chronic    Current Therapy:   Xarelto 20 mg by mouth daily - finished April 2016 EC ASA 162 mg po q day    Interim History:  Kim Fox is here today for a follow-up. She is doing quite well and has no complaints at this time. She has had no episodes of postphlebitic pain. No swelling, tenderness, numbness or tingling in her extremities.   She is off Xarelto. She is doing well on baby aspirin. She takes 2 baby aspirin daily.  Apparently, there are some issues at home. She sees be under a lot of stress.   Thankfully, her last Doppler did not show any residual thrombus in the right peroneal vein.   Her last mammogram was a year ago. She actually is due for one next week.   She is still working. She is busy at work.   Overall, her performance status is ECOG 0.    Medications:  Allergies as of 10/07/2016      Reactions   Macrodantin Rash   Penicillins Rash      Medication List       Accurate as of 10/07/16  8:48 AM. Always use your most recent med list.          Acidophilus/Goat Milk Caps Take by mouth.   aspirin EC 81 MG tablet Take 81 mg by mouth 2 (two) times daily.   atenolol 50 MG tablet Commonly known as:  TENORMIN Take 1 tablet (50 mg total) by mouth daily.   Biotin 5000 MCG Caps Take 1 capsule by mouth daily.   Calcium-Vitamin D 600-200 MG-UNIT Caps Take 1 tablet by mouth 2 (two) times daily.   folic acid 347 MCG tablet Commonly known as:  FOLVITE Take 400 mcg by mouth daily.   L-Lysine 1000 MG Tabs Take 1 tablet by mouth daily.   multivitamin tablet Take 1 tablet by mouth daily.   multivitamin-lutein Caps capsule Take 1 capsule by mouth daily.   SUMAtriptan 100 MG tablet Commonly known as:  IMITREX Take at the onset of HA.  May repeat in 2 hours if needed.  Max dose 2/24 hours, 4/week         Allergies:  Allergies  Allergen Reactions  . Macrodantin Rash  . Penicillins Rash    Past Medical History, Surgical history, Social history, and Family History were reviewed and updated.  Review of Systems: All other 10 point review of systems is negative.   Physical Exam:  weight is 242 lb 6.4 oz (110 kg). Her oral temperature is 98.1 F (36.7 C). Her blood pressure is 134/73 and her pulse is 58 (abnormal). Her respiration is 16 and oxygen saturation is 99%.   Wt Readings from Last 3 Encounters:  10/07/16 242 lb 6.4 oz (110 kg)  07/06/16 240 lb (108.9 kg)  01/06/16 247 lb 1.3 oz (112.1 kg)    Well-developed and well-nourished white female in no obvious distress. Head and neck exam shows no ocular or oral lesions. There are no palpable cervical or supraclavicular lymph nodes. Lungs are clear. Cardiac exam regular rate and rhythm with no murmurs, rubs or bruits. Abdomen is soft. She has good bowel sounds. There is no fluid wave. There is no palpable liver or spleen tip. Back exam shows no tenderness over the spine, ribs or hips. Extremities shows no clubbing, cyanosis or edema.  She has a negative Homans sign in her legs bilaterally. No venous cords noted in her legs bilaterally. She has good range of motion of her joints. Skin exam shows no rashes, ecchymoses or petechia. Neurological exam shows no focal neurological deficits.   Lab Results  Component Value Date   WBC 7.9 10/07/2016   HGB 15.6 10/07/2016   HCT 46.2 10/07/2016   MCV 89 10/07/2016   PLT 233 10/07/2016   No results found for: FERRITIN, IRON, TIBC, UIBC, IRONPCTSAT Lab Results  Component Value Date   RBC 5.17 10/07/2016   No results found for: KPAFRELGTCHN, LAMBDASER, KAPLAMBRATIO No results found for: IGGSERUM, IGA, IGMSERUM No results found for: Odetta Pink, SPEI   Chemistry      Component Value Date/Time   NA 140 07/06/2016 0835   K 4.3  07/06/2016 0835   CL 105 10/07/2015 0731   CO2 26 07/06/2016 0835   BUN 18.8 07/06/2016 0835   CREATININE 0.9 07/06/2016 0835      Component Value Date/Time   CALCIUM 9.1 07/06/2016 0835   ALKPHOS 87 07/06/2016 0835   AST 20 07/06/2016 0835   ALT 16 07/06/2016 0835   BILITOT 0.71 07/06/2016 0835     Impression and Plan: Kim Fox is 51 yo white female with 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  At this point, I just don't think that we are adding anything to her medical care. There is no thrombus in her leg. She's doing well on aspirin.  I think we will let her go from the clinic. If she has any problems or if she thinks that she needs to see [place] or have a Doppler done, then we will be 1 happy to get her back.  It was a was nice to see her and talk with her. We will pray hard for her.   Volanda Napoleon, MD 4/19/20188:48 AM

## 2016-10-14 ENCOUNTER — Other Ambulatory Visit (HOSPITAL_COMMUNITY)
Admission: RE | Admit: 2016-10-14 | Discharge: 2016-10-14 | Disposition: A | Discharge status: Home / Self Care | Payer: BLUE CROSS/BLUE SHIELD | Source: Ambulatory Visit | Attending: Family Medicine | Admitting: Family Medicine

## 2016-10-14 ENCOUNTER — Encounter: Payer: Self-pay | Admitting: Family Medicine

## 2016-10-14 ENCOUNTER — Ambulatory Visit (INDEPENDENT_AMBULATORY_CARE_PROVIDER_SITE_OTHER): Payer: BLUE CROSS/BLUE SHIELD | Admitting: Family Medicine

## 2016-10-14 VITALS — BP 120/82 | HR 55 | Temp 98.0°F | Resp 18 | Ht 68.0 in | Wt 241.0 lb

## 2016-10-14 DIAGNOSIS — Z124 Encounter for screening for malignant neoplasm of cervix: Secondary | ICD-10-CM

## 2016-10-14 DIAGNOSIS — G43909 Migraine, unspecified, not intractable, without status migrainosus: Secondary | ICD-10-CM | POA: Diagnosis not present

## 2016-10-14 DIAGNOSIS — Z0000 Encounter for general adult medical examination without abnormal findings: Secondary | ICD-10-CM | POA: Diagnosis not present

## 2016-10-14 DIAGNOSIS — I10 Essential (primary) hypertension: Secondary | ICD-10-CM | POA: Diagnosis not present

## 2016-10-14 DIAGNOSIS — I825Z1 Chronic embolism and thrombosis of unspecified deep veins of right distal lower extremity: Secondary | ICD-10-CM

## 2016-10-14 LAB — TSH: TSH: 1.23 u[IU]/mL (ref 0.35–4.50)

## 2016-10-14 NOTE — Assessment & Plan Note (Addendum)
Well controlled, no changes to meds. Encouraged heart healthy diet such as the DASH diet and exercise as tolerated. Due to worsening fatigue will check tsh

## 2016-10-14 NOTE — Progress Notes (Signed)
Pre visit review using our clinic review tool, if applicable. No additional management support is needed unless otherwise documented below in the visit note. 

## 2016-10-14 NOTE — Progress Notes (Signed)
Subjective:  I acted as a Education administrator for Dr. Charlett Blake. [place], [place]          Patient ID: Kim Fox, female    DOB: Jul 11, 1965, 51 y.o.   MRN: [id number]  Chief Complaint  Patient presents with  . Annual Exam    HPI  Patient is in today for an annual exam with a pap smear. Patient has no vaginal concerns. Patient in today with pre- menopause symptoms, currently having symptoms of extreme sleepiness and malaise at times. Notes sleep and mood can be mildly disrupted. No recent febrile illness or hospitalizations. No GYN concerns. She has been trying to stay on a heart healthy diet. Denies CP/palp/SOB/HA/congestion/fevers/GI or GU c/o. Taking meds as prescribed  Patient Care Team: [name], MD as PCP - General (Family Medicine)   Past Medical History:  Diagnosis Date  . GERD (gastroesophageal reflux disease)   . Headache(784.0)    migraine  . History of chicken pox 04/10/2015  . Hypertension   . I 04/04/2014  . Menorrhagia   . Multiple renal cysts 08/09/2011  . Murmur, cardiac    Echo WNL 2009  . Obesity   . Polycystic kidney disease   . Preventative health care 04/20/2015  . Urinary incontinence 10/28/2015    Past Surgical History:  Procedure Laterality Date  . ENDOMETRIAL BIOPSY  2010  . KIDNEY CYST REMOVAL  2002  . TONSILLECTOMY    . URETERAL STENT PLACEMENT  2002    Family History  Problem Relation Age of Onset  . Diabetes Mother     insulin dependent  . Hypertension Mother   . Migraines Mother   . Varicose Veins Mother   . Arthritis Mother   . Diabetes Father   . Hypertension Father   . Kidney disease Father   . Arthritis Father   . Migraines Sister   . Allergies Sister   . Renal Cyst Sister   . Cancer Sister     skin cancer  . Dysfunctional uterine bleeding Sister   . Cancer Paternal Grandmother     lung  . Cancer Paternal Grandfather     bladder, prostate  . Heart disease Paternal Grandfather   . Hyperlipidemia Daughter   . Fibromyalgia  Sister   . Renal Cyst Sister   . Hypertension Sister   . Dysfunctional uterine bleeding Sister     Social History   Social History  . Marital status: Divorced    Spouse name: N/A  . Number of children: 2  . Years of education: college   Occupational History  . 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 Smoking status: Never Smoker  . Smokeless tobacco: Never Used     Comment: never used tobacco  . Alcohol use 0.6 oz/week    1 Standard drinks or equivalent per week  . Drug use: No  . Sexual activity: Not Currently     Comment: lives with daughter and occasionally son, works in [place], no major dietary   Other Topics Concern  . Not on file   Social History Narrative  . No narrative on file    Outpatient Medications Prior to Visit  Medication Sig Dispense Refill  . aspirin EC 81 MG tablet Take 81 mg by mouth 2 (two) times daily.    Marland Kitchen atenolol (TENORMIN) 50 MG tablet Take 1 tablet (50 mg total) by mouth daily. 90 tablet 1  . Biotin 5000 MCG CAPS Take 1 capsule by  mouth daily.    . Calcium Carbonate-Vitamin D (CALCIUM-VITAMIN D) 600-200 MG-UNIT CAPS Take 1 tablet by mouth 2 (two) times daily.    . folic acid (FOLVITE) 790 MCG tablet Take 400 mcg by mouth daily.    Marland Kitchen L-Lysine 1000 MG TABS Take 1 tablet by mouth daily.    . Multiple Vitamin (MULTIVITAMIN) tablet Take 1 tablet by mouth daily.    . multivitamin-lutein (OCUVITE-LUTEIN) CAPS capsule Take 1 capsule by mouth daily.    . Probiotic Product (ACIDOPHILUS/GOAT MILK) CAPS Take by mouth.    . SUMAtriptan (IMITREX) 100 MG tablet Take at the onset of HA.  May repeat in 2 hours if needed.  Max dose 2/24 hours, 4/week 10 tablet 3   No facility-administered medications prior to visit.     Allergies  Allergen Reactions  . Nitrofurantoin Rash  . Macrodantin Rash  . Penicillins Rash    Review of Systems  Constitutional: Positive for malaise/fatigue. Negative for fever.  HENT: Negative for  congestion.   Eyes: Negative for blurred vision.  Respiratory: Negative for cough and shortness of breath.   Cardiovascular: Negative for chest pain, palpitations and leg swelling.  Gastrointestinal: Negative for blood in stool and vomiting.  Musculoskeletal: Negative for back pain.  Skin: Negative for rash.  Neurological: Negative for loss of consciousness and headaches.       Objective:    Physical Exam  Constitutional: She is oriented to person, place, and time. She appears well-developed and well-nourished. No distress.  HENT:  Head: Normocephalic and atraumatic.  Right Ear: External ear normal.  Left Ear: External ear normal.  Nose: Nose normal.  Mouth/Throat: Oropharynx is clear and moist.  Eyes: Conjunctivae and EOM are normal. Pupils are equal, round, and reactive to light. Right eye exhibits no discharge. Left eye exhibits no discharge.  Neck: Normal range of motion. Neck supple. No JVD present. No thyromegaly present.  Cardiovascular: Normal rate, regular rhythm, normal heart sounds and intact distal pulses.   No murmur heard. Pulmonary/Chest: Effort normal and breath sounds normal. No respiratory distress. She has no wheezes. She has no rales. She exhibits no tenderness.  Abdominal: Soft. Bowel sounds are normal. She exhibits no distension and no mass. There is no tenderness. There is no rebound and no guarding.  Genitourinary: Vagina normal and uterus normal. No vaginal discharge found.  Musculoskeletal: Normal range of motion. She exhibits no edema, tenderness or deformity.  Lymphadenopathy:    She has no cervical adenopathy.  Neurological: She is alert and oriented to person, place, and time. She has normal reflexes. No cranial nerve deficit.  Skin: Skin is warm and dry. No rash noted. She is not diaphoretic. No erythema.  Psychiatric: She has a normal mood and affect. Her behavior is normal. Judgment and thought content normal.  Nursing note and vitals  reviewed.   BP 120/82 (BP Location: Left Arm, Patient Position: Sitting, Cuff Size: Normal)   Pulse (!) 55   Temp 98 F (36.7 C) (Oral)   Resp 18   Ht '5\' 8"'  (1.727 m)   Wt 241 lb (109.3 kg)   SpO2 98%   BMI 36.64 kg/m  Wt Readings from Last 3 Encounters:  10/14/16 241 lb (109.3 kg)  10/07/16 242 lb 6.4 oz (110 kg)  07/06/16 240 lb (108.9 kg)   BP Readings from Last 3 Encounters:  10/14/16 120/82  10/07/16 134/73  07/06/16 133/68     Immunization History  Administered Date(s) Administered  . Influenza Split 03/18/2011  .  Influenza,inj,Quad PF,36+ Mos 04/04/2013, 04/18/2014, 04/10/2015  . Tdap 08/09/2011    Health Maintenance  Topic Date Due  . HIV Screening  05/16/1982  . INFLUENZA VACCINE  07/06/2017 (Originally 01/19/2017)  . PAP SMEAR  10/15/2019  . TETANUS/TDAP  08/08/2021    Lab Results  Component Value Date   WBC 7.9 10/07/2016   HGB 15.6 10/07/2016   HCT 46.2 10/07/2016   PLT 233 10/07/2016   GLUCOSE 101 10/07/2016   CHOL 140 10/07/2015   TRIG 109.0 10/07/2015   HDL 44.70 10/07/2015   LDLCALC 74 10/07/2015   ALT 16 10/07/2016   AST 19 10/07/2016   NA 141 10/07/2016   K 4.3 10/07/2016   CL 105 10/07/2015   CREATININE 0.9 10/07/2016   BUN 23.2 10/07/2016   CO2 25 10/07/2016   TSH 1.23 10/14/2016   INR 1.03 03/25/2014    Lab Results  Component Value Date   TSH 1.23 10/14/2016   Lab Results  Component Value Date   WBC 7.9 10/07/2016   HGB 15.6 10/07/2016   HCT 46.2 10/07/2016   MCV 89 10/07/2016   PLT 233 10/07/2016   Lab Results  Component Value Date   NA 141 10/07/2016   K 4.3 10/07/2016   CHLORIDE 108 10/07/2016   CO2 25 10/07/2016   GLUCOSE 101 10/07/2016   BUN 23.2 10/07/2016   CREATININE 0.9 10/07/2016   BILITOT 0.40 10/07/2016   ALKPHOS 76 10/07/2016   AST 19 10/07/2016   ALT 16 10/07/2016   PROT 7.4 10/07/2016   ALBUMIN 3.4 (L) 10/07/2016   CALCIUM 9.4 10/07/2016   ANIONGAP 8 10/07/2016   EGFR 80 (L) 10/07/2016   GFR  77.86 10/07/2015   Lab Results  Component Value Date   CHOL 140 10/07/2015   Lab Results  Component Value Date   HDL 44.70 10/07/2015   Lab Results  Component Value Date   LDLCALC 74 10/07/2015   Lab Results  Component Value Date   TRIG 109.0 10/07/2015   Lab Results  Component Value Date   CHOLHDL 3 10/07/2015   No results found for: HGBA1C       Assessment & Plan:   Problem List Items Addressed This Visit    Essential hypertension, benign    Well controlled, no changes to meds. Encouraged heart healthy diet such as the DASH diet and exercise as tolerated. Due to worsening fatigue will check tsh       Migraine    Very infrequent Imitrex helpful when she needs it      Right leg DVT (HCC)    No recurrence has been released by hematology unless symptoms worsen      Preventative health care    Patient encouraged to maintain heart healthy diet, regular exercise, adequate sleep. Consider daily probiotics. Take medications as prescribed      Relevant Orders   Cytology - PAP (Completed)   TSH (Completed)   Cervical cancer screening    Pap today, no concerns on exam. No discharge      Relevant Orders   Cytology - PAP (Completed)      I am having Ms. Ohagan maintain her L-Lysine, Biotin, multivitamin, Calcium-Vitamin D, folic acid, SUMAtriptan, Acidophilus/Goat Milk, atenolol, aspirin EC, and multivitamin-lutein.  No orders of the defined types were placed in this encounter.   CMA served as Education administrator during this visit. History, Physical and Plan performed by medical provider. Documentation and orders reviewed and attested to.  Penni Homans, MD

## 2016-10-14 NOTE — Patient Instructions (Signed)
HYDRATE HYDRATE HYDRATE 64 OUNCES OF WATER A DAY     Preventive Care 40-64 Years, Female Preventive care refers to lifestyle choices and visits with your health care provider that can promote health and wellness. What does preventive care include?  A yearly physical exam. This is also called an annual well check.  Dental exams once or twice a year.  Routine eye exams. Ask your health care provider how often you should have your eyes checked.  Personal lifestyle choices, including:  Daily care of your teeth and gums.  Regular physical activity.  Eating a healthy diet.  Avoiding tobacco and drug use.  Limiting alcohol use.  Practicing safe sex.  Taking low-dose aspirin daily starting at age 31.  Taking vitamin and mineral supplements as recommended by your health care provider. What happens during an annual well check? The services and screenings done by your health care provider during your annual well check will depend on your age, overall health, lifestyle risk factors, and family history of disease. Counseling  Your health care provider may ask you questions about your:  Alcohol use.  Tobacco use.  Drug use.  Emotional well-being.  Home and relationship well-being.  Sexual activity.  Eating habits.  Work and work Statistician.  Method of birth control.  Menstrual cycle.  Pregnancy history. Screening  You may have the following tests or measurements:  Height, weight, and BMI.  Blood pressure.  Lipid and cholesterol levels. These may be checked every 5 years, or more frequently if you are over 29 years old.  Skin check.  Lung cancer screening. You may have this screening every year starting at age 22 if you have a 30-pack-year history of smoking and currently smoke or have quit within the past 15 years.  Fecal occult blood test (FOBT) of the stool. You may have this test every year starting at age 37.  Flexible sigmoidoscopy or colonoscopy. You  may have a sigmoidoscopy every 5 years or a colonoscopy every 10 years starting at age 48.  Hepatitis C blood test.  Hepatitis B blood test.  Sexually transmitted disease (STD) testing.  Diabetes screening. This is done by checking your blood sugar (glucose) after you have not eaten for a while (fasting). You may have this done every 1-3 years.  Mammogram. This may be done every 1-2 years. Talk to your health care provider about when you should start having regular mammograms. This may depend on whether you have a family history of breast cancer.  BRCA-related cancer screening. This may be done if you have a family history of breast, ovarian, tubal, or peritoneal cancers.  Pelvic exam and Pap test. This may be done every 3 years starting at age 18. Starting at age 33, this may be done every 5 years if you have a Pap test in combination with an HPV test.  Bone density scan. This is done to screen for osteoporosis. You may have this scan if you are at high risk for osteoporosis. Discuss your test results, treatment options, and if necessary, the need for more tests with your health care provider. Vaccines  Your health care provider may recommend certain vaccines, such as:  Influenza vaccine. This is recommended every year.  Tetanus, diphtheria, and acellular pertussis (Tdap, Td) vaccine. You may need a Td booster every 10 years.  Varicella vaccine. You may need this if you have not been vaccinated.  Zoster vaccine. You may need this after age 66.  Measles, mumps, and rubella (MMR) vaccine.  You may need at least one dose of MMR if you were born in 1957 or later. You may also need a second dose.  Pneumococcal 13-valent conjugate (PCV13) vaccine. You may need this if you have certain conditions and were not previously vaccinated.  Pneumococcal polysaccharide (PPSV23) vaccine. You may need one or two doses if you smoke cigarettes or if you have certain conditions.  Meningococcal vaccine.  You may need this if you have certain conditions.  Hepatitis A vaccine. You may need this if you have certain conditions or if you travel or work in places where you may be exposed to hepatitis A.  Hepatitis B vaccine. You may need this if you have certain conditions or if you travel or work in places where you may be exposed to hepatitis B.  Haemophilus influenzae type b (Hib) vaccine. You may need this if you have certain conditions. Talk to your health care provider about which screenings and vaccines you need and how often you need them. This information is not intended to replace advice given to you by your health care provider. Make sure you discuss any questions you have with your health care provider. Document Released: 07/04/2015 Document Revised: 02/25/2016 Document Reviewed: 04/08/2015 Elsevier Interactive Patient Education  2017 [employer].

## 2016-10-14 NOTE — Assessment & Plan Note (Signed)
Very infrequent Imitrex helpful when she needs it

## 2016-10-14 NOTE — Assessment & Plan Note (Addendum)
Pap today, no concerns on exam. No discharge

## 2016-10-14 NOTE — Assessment & Plan Note (Signed)
Patient encouraged to maintain heart healthy diet, regular exercise, adequate sleep. Consider daily probiotics. Take medications as prescribed 

## 2016-10-14 NOTE — Assessment & Plan Note (Signed)
Encouraged DASH diet, decrease po intake and increase exercise as tolerated. Needs 7-8 hours of sleep nightly. Avoid trans fats, eat small, frequent meals every 4-5 hours with lean proteins, complex carbs and healthy fats. Minimize simple carbs.  Bariatric referral 

## 2016-10-14 NOTE — Assessment & Plan Note (Signed)
No recurrence has been released by hematology unless symptoms worsen

## 2016-10-15 LAB — CYTOLOGY - PAP
Diagnosis: NEGATIVE
HPV: NOT DETECTED

## 2016-10-19 ENCOUNTER — Ambulatory Visit (HOSPITAL_BASED_OUTPATIENT_CLINIC_OR_DEPARTMENT_OTHER)
Admission: RE | Admit: 2016-10-19 | Discharge: 2016-10-19 | Disposition: A | Discharge status: Home / Self Care | Payer: BLUE CROSS/BLUE SHIELD | Source: Ambulatory Visit | Attending: Family Medicine | Admitting: Family Medicine

## 2016-10-19 DIAGNOSIS — Z1231 Encounter for screening mammogram for malignant neoplasm of breast: Secondary | ICD-10-CM | POA: Insufficient documentation

## 2016-12-27 ENCOUNTER — Ambulatory Visit (INDEPENDENT_AMBULATORY_CARE_PROVIDER_SITE_OTHER): Payer: BLUE CROSS/BLUE SHIELD | Admitting: Medical

## 2016-12-27 VITALS — BP 135/69 | HR 55 | Temp 98.2°F | Resp 16 | Ht 68.0 in | Wt 252.4 lb

## 2016-12-27 DIAGNOSIS — T7840XA Allergy, unspecified, initial encounter: Secondary | ICD-10-CM | POA: Diagnosis not present

## 2016-12-27 MED ORDER — PREDNISONE 10 MG PO TABS: ORAL_TABLET | ORAL | 0 refills | Status: DC

## 2016-12-27 MED ORDER — HYDROXYZINE HCL 25 MG PO TABS: 25.0000 mg | ORAL_TABLET | Freq: Three times a day (TID) | ORAL | 0 refills | Status: DC | PRN

## 2016-12-27 NOTE — Progress Notes (Signed)
Subjective:    Patient ID: Kim Fox, female    DOB: Mar 20, 1966, 51 y.o.   MRN: [id number]  HPI  Pt was on vacation.  She just got back from [facility]. Pt had rash  on her upper back, some on legs as well and on her arms as well.  She states Friday just started  on her back and upper chest.   Over the weekend got worse. Pt states very itchy.  She got slight burn to shoulders but not on all over.  Pt used same sunblock that she always uses and no prior reaction. On review overall. No suspicious exposure.  Pt did remember eating scallops and clams but she has eaten in past. No suspicious onset with eating these foods. No sob or wheezing.      Review of Systems  Constitutional: Negative for chills, fatigue and fever.  HENT: Negative for congestion and dental problem.   Respiratory: Negative for cough, chest tightness, shortness of breath and wheezing.   Cardiovascular: Negative for chest pain and palpitations.  Gastrointestinal: Negative for abdominal pain.  Genitourinary: Negative for decreased urine volume, dyspareunia, urgency and vaginal pain.  Musculoskeletal: Negative for back pain and joint swelling.  Skin: Positive for rash.       Itching.  Neurological: Negative for dizziness, syncope, light-headedness and headaches.  Hematological: Negative for adenopathy. Does not bruise/bleed easily.  Psychiatric/Behavioral: Negative for behavioral problems and confusion.    Past Medical History:  Diagnosis Date  . GERD (gastroesophageal reflux disease)   . Headache(784.0)    migraine  . History of chicken pox 04/10/2015  . Hypertension   . I 04/04/2014  . Menorrhagia   . Multiple renal cysts 08/09/2011  . Murmur, cardiac    Echo WNL 2009  . Obesity   . Polycystic kidney disease   . Preventative health care 04/20/2015  . Urinary incontinence 10/28/2015     Social History   Social History  . Marital status: Divorced    Spouse name: N/A  . Number of  children: 2  . Years of education: college   Occupational History  . 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 Smoking status: Never Smoker  . Smokeless tobacco: Never Used     Comment: never used tobacco  . Alcohol use 0.6 oz/week    1 Standard drinks or equivalent per week  . Drug use: No  . Sexual activity: Not Currently     Comment: lives with daughter and occasionally son, works in [place], no major dietary   Other Topics Concern  . Not on file   Social History Narrative  . No narrative on file    Past Surgical History:  Procedure Laterality Date  . ENDOMETRIAL BIOPSY  2010  . KIDNEY CYST REMOVAL  2002  . TONSILLECTOMY    . URETERAL STENT PLACEMENT  2002    Family History  Problem Relation Age of Onset  . Diabetes Mother        insulin dependent  . Hypertension Mother   . Migraines Mother   . Varicose Veins Mother   . Arthritis Mother   . Diabetes Father   . Hypertension Father   . Kidney disease Father   . Arthritis Father   . Migraines Sister   . Allergies Sister   . Renal Cyst Sister   . Cancer Sister        skin cancer  . Dysfunctional uterine bleeding Sister   .  Cancer Paternal Grandmother        lung  . Cancer Paternal Grandfather        bladder, prostate  . Heart disease Paternal Grandfather   . Hyperlipidemia Daughter   . Fibromyalgia Sister   . Renal Cyst Sister   . Hypertension Sister   . Dysfunctional uterine bleeding Sister     Allergies  Allergen Reactions  . Nitrofurantoin Rash  . Macrodantin Rash  . Penicillins Rash    Current Outpatient Prescriptions on File Prior to Visit  Medication Sig Dispense Refill  . aspirin EC 81 MG tablet Take 81 mg by mouth 2 (two) times daily.    Marland Kitchen atenolol (TENORMIN) 50 MG tablet Take 1 tablet (50 mg total) by mouth daily. 90 tablet 1  . Biotin 5000 MCG CAPS Take 1 capsule by mouth daily.    . Calcium Carbonate-Vitamin D (CALCIUM-VITAMIN D) 600-200 MG-UNIT CAPS Take 1  tablet by mouth 2 (two) times daily.    . folic acid (FOLVITE) 401 MCG tablet Take 400 mcg by mouth daily.    Marland Kitchen L-Lysine 1000 MG TABS Take 1 tablet by mouth daily.    . Multiple Vitamin (MULTIVITAMIN) tablet Take 1 tablet by mouth daily.    . multivitamin-lutein (OCUVITE-LUTEIN) CAPS capsule Take 1 capsule by mouth daily.    . Probiotic Product (ACIDOPHILUS/GOAT MILK) CAPS Take by mouth.    . SUMAtriptan (IMITREX) 100 MG tablet Take at the onset of HA.  May repeat in 2 hours if needed.  Max dose 2/24 hours, 4/week 10 tablet 3   No current facility-administered medications on file prior to visit.     BP 135/69   Pulse (!) 55   Temp 98.2 F (36.8 C) (Oral)   Resp 16   Ht 5\' 8"  (1.727 m)   Wt 252 lb 6.4 oz (114.5 kg)   SpO2 98%   BMI 38.38 kg/m       Objective:   Physical Exam   General- No acute distress. Pleasant patient. Neck- Full range of motion, no jvd Lungs- Clear, even and unlabored. Heart- regular rate and rhythm. Neurologic- CNII- XII grossly intact.  Derm- scattered rash on both legs, forearms, upper chest and back. Some follicles appear mild inflamed on the back.     Assessment & Plan:  You appear to have likely allergic reaction. Etiology undetermined. It might be beneficial in future to change sun block you used recently.   Currently rx taper prednisone and hydroxyzine.(stop benadryl)  The rash should gradually get better.  If follicles remain slight inflamed might consider antibiotic.  Follow up 7 days or as needed  Reef Achterberg, Percell Miller, [employer]

## 2016-12-27 NOTE — Patient Instructions (Addendum)
You appear to have likely allergic reaction. Etiology undetermined. It might be beneficial in future to change sun block you used recently.   Currently rx taper prednisone and hydroxyzine.(stop benadryl)  The rash should gradually get better.  If follicles remain slight inflamed might consider antibiotic.  Follow up 7 days or as needed

## 2016-12-29 ENCOUNTER — Other Ambulatory Visit: Payer: Self-pay | Admitting: Family Medicine

## 2016-12-29 DIAGNOSIS — I10 Essential (primary) hypertension: Secondary | ICD-10-CM

## 2017-03-27 ENCOUNTER — Other Ambulatory Visit: Payer: Self-pay | Admitting: Family Medicine

## 2017-03-27 DIAGNOSIS — I10 Essential (primary) hypertension: Secondary | ICD-10-CM

## 2017-07-12 DIAGNOSIS — H35412 Lattice degeneration of retina, left eye: Secondary | ICD-10-CM | POA: Diagnosis not present

## 2017-07-12 DIAGNOSIS — H43812 Vitreous degeneration, left eye: Secondary | ICD-10-CM | POA: Diagnosis not present

## 2017-07-12 DIAGNOSIS — H33312 Horseshoe tear of retina without detachment, left eye: Secondary | ICD-10-CM | POA: Diagnosis not present

## 2017-07-26 DIAGNOSIS — H33312 Horseshoe tear of retina without detachment, left eye: Secondary | ICD-10-CM | POA: Diagnosis not present

## 2017-07-26 DIAGNOSIS — H35412 Lattice degeneration of retina, left eye: Secondary | ICD-10-CM | POA: Diagnosis not present

## 2017-08-13 DIAGNOSIS — H35412 Lattice degeneration of retina, left eye: Secondary | ICD-10-CM | POA: Diagnosis not present

## 2017-08-13 DIAGNOSIS — H33312 Horseshoe tear of retina without detachment, left eye: Secondary | ICD-10-CM | POA: Diagnosis not present

## 2017-08-13 DIAGNOSIS — H5319 Other subjective visual disturbances: Secondary | ICD-10-CM | POA: Diagnosis not present

## 2017-09-06 ENCOUNTER — Other Ambulatory Visit: Payer: Self-pay | Admitting: Family Medicine

## 2017-09-06 DIAGNOSIS — Z1231 Encounter for screening mammogram for malignant neoplasm of breast: Secondary | ICD-10-CM

## 2017-10-11 ENCOUNTER — Telehealth: Payer: Self-pay | Admitting: Family Medicine

## 2017-10-11 NOTE — Telephone Encounter (Signed)
Copied from [place] [id number]. Topic: Quick Communication - See Telephone Encounter >> Oct 11, 2017  9:36 AM Rosalin Hawking wrote: CRM for notification. See Telephone encounter for: 10/11/17.   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 Document when ready to call pt at [phone]. Document put at front office tray under providers name.

## 2017-10-12 NOTE — Telephone Encounter (Signed)
LMOM with contact name and number for return call, if needed RE: "Medical Certification" for Application of Disability Parking Placard. 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 Both of her parents are Dr. Nani Ravens patients and on the phone message I informed her that she can either: 1) Bring [place] two separate Placard Applications, one for each parent [due to the need of parent to be in the car with daughter when in use] and Dr. Nani Ravens will complete and/or 2) Bring [place] one Placard Application for either parent and pay $5.00 extra at [facility] to receive [2] placards for Dr. Nani Ravens to complete. Application for this patient has been Void/SLS 04/24

## 2017-10-14 ENCOUNTER — Other Ambulatory Visit: Payer: BLUE CROSS/BLUE SHIELD

## 2017-10-17 ENCOUNTER — Encounter: Payer: Self-pay | Admitting: Family Medicine

## 2017-10-17 ENCOUNTER — Ambulatory Visit (INDEPENDENT_AMBULATORY_CARE_PROVIDER_SITE_OTHER): Payer: BLUE CROSS/BLUE SHIELD | Admitting: Family Medicine

## 2017-10-17 DIAGNOSIS — Z0000 Encounter for general adult medical examination without abnormal findings: Secondary | ICD-10-CM

## 2017-10-17 DIAGNOSIS — E785 Hyperlipidemia, unspecified: Secondary | ICD-10-CM | POA: Diagnosis not present

## 2017-10-17 DIAGNOSIS — F418 Other specified anxiety disorders: Secondary | ICD-10-CM | POA: Diagnosis not present

## 2017-10-17 DIAGNOSIS — I10 Essential (primary) hypertension: Secondary | ICD-10-CM

## 2017-10-17 DIAGNOSIS — Q6102 Congenital multiple renal cysts: Secondary | ICD-10-CM

## 2017-10-17 DIAGNOSIS — E6609 Other obesity due to excess calories: Secondary | ICD-10-CM

## 2017-10-17 HISTORY — DX: Other specified anxiety disorders: F41.8

## 2017-10-17 HISTORY — DX: Hyperlipidemia, unspecified: E78.5

## 2017-10-17 LAB — CBC
HCT: 47.2 % — ABNORMAL HIGH (ref 36.0–46.0)
HEMOGLOBIN: 15.4 g/dL — AB (ref 12.0–15.0)
MCHC: 32.6 g/dL (ref 30.0–36.0)
MCV: 90.6 fl (ref 78.0–100.0)
PLATELETS: 255 10*3/uL (ref 150.0–400.0)
RBC: 5.21 Mil/uL — ABNORMAL HIGH (ref 3.87–5.11)
RDW: 14.2 % (ref 11.5–15.5)
WBC: 8.2 10*3/uL (ref 4.0–10.5)

## 2017-10-17 LAB — URINALYSIS
BILIRUBIN URINE: NEGATIVE
Hgb urine dipstick: NEGATIVE
KETONES UR: NEGATIVE
LEUKOCYTES UA: NEGATIVE
Nitrite: NEGATIVE
Specific Gravity, Urine: 1.015 (ref 1.000–1.030)
Total Protein, Urine: NEGATIVE
UROBILINOGEN UA: 0.2 (ref 0.0–1.0)
Urine Glucose: NEGATIVE
pH: 5.5 (ref 5.0–8.0)

## 2017-10-17 LAB — LIPID PANEL
CHOLESTEROL: 160 mg/dL (ref 0–200)
HDL: 50 mg/dL (ref >39.00)
LDL Cholesterol: 90 mg/dL (ref 0–99)
NonHDL: 110.39
TRIGLYCERIDES: 101 mg/dL (ref 0.0–149.0)
Total CHOL/HDL Ratio: 3
VLDL: 20.2 mg/dL (ref 0.0–40.0)

## 2017-10-17 LAB — COMPREHENSIVE METABOLIC PANEL
ALK PHOS: 66 U/L (ref 39–117)
ALT: 16 U/L (ref 0–35)
AST: 20 U/L (ref 0–37)
Albumin: 3.6 g/dL (ref 3.5–5.2)
BILIRUBIN TOTAL: 0.5 mg/dL (ref 0.2–1.2)
BUN: 21 mg/dL (ref 6–23)
CALCIUM: 9.5 mg/dL (ref 8.4–10.5)
CO2: 29 mEq/L (ref 19–32)
Chloride: 104 mEq/L (ref 96–112)
Creatinine, Ser: 0.78 mg/dL (ref 0.40–1.20)
GFR: 82.95 mL/min (ref >60.00)
Glucose, Bld: 104 mg/dL — ABNORMAL HIGH (ref 70–99)
Potassium: 4.2 mEq/L (ref 3.5–5.1)
Sodium: 140 mEq/L (ref 135–145)
TOTAL PROTEIN: 7.3 g/dL (ref 6.0–8.3)

## 2017-10-17 LAB — TSH: TSH: 1.02 u[IU]/mL (ref 0.35–4.50)

## 2017-10-17 LAB — MICROALBUMIN / CREATININE URINE RATIO
CREATININE, U: 76.6 mg/dL
MICROALB/CREAT RATIO: 1.2 mg/g (ref 0.0–30.0)
Microalb, Ur: 1 mg/dL (ref 0.0–1.9)

## 2017-10-17 MED ORDER — ATENOLOL 50 MG PO TABS
ORAL_TABLET | ORAL | 2 refills | Status: DC
Start: 2017-10-17 — End: 2018-07-27

## 2017-10-17 NOTE — Assessment & Plan Note (Signed)
Check cmp, urine micro

## 2017-10-17 NOTE — Assessment & Plan Note (Signed)
Encouraged DASH diet, decrease po intake and increase exercise as tolerated. Needs 7-8 hours of sleep nightly. Avoid trans fats, eat small, frequent meals every 4-5 hours with lean proteins, complex carbs and healthy fats. Minimize simple carbs, bariatric referral 

## 2017-10-17 NOTE — Assessment & Plan Note (Signed)
Struggling with her two elderly parents who are very frail at this time. She denies any meds or counselig at this time but will let us know if she changes her mind.

## 2017-10-17 NOTE — Patient Instructions (Addendum)
MIND diet  Preventive Care 40-64 Years, Female Preventive care refers to lifestyle choices and visits with your health care provider that can promote health and wellness. What does preventive care include?  A yearly physical exam. This is also called an annual well check.  Dental exams once or twice a year.  Routine eye exams. Ask your health care provider how often you should have your eyes checked.  Personal lifestyle choices, including: ? Daily care of your teeth and gums. ? Regular physical activity. ? Eating a healthy diet. ? Avoiding tobacco and drug use. ? Limiting alcohol use. ? Practicing safe sex. ? Taking low-dose aspirin daily starting at age 60. ? Taking vitamin and mineral supplements as recommended by your health care provider. What happens during an annual well check? The services and screenings done by your health care provider during your annual well check will depend on your age, overall health, lifestyle risk factors, and family history of disease. Counseling Your health care provider may ask you questions about your:  Alcohol use.  Tobacco use.  Drug use.  Emotional well-being.  Home and relationship well-being.  Sexual activity.  Eating habits.  Work and work Statistician.  Method of birth control.  Menstrual cycle.  Pregnancy history.  Screening You may have the following tests or measurements:  Height, weight, and BMI.  Blood pressure.  Lipid and cholesterol levels. These may be checked every 5 years, or more frequently if you are over 88 years old.  Skin check.  Lung cancer screening. You may have this screening every year starting at age 41 if you have a 30-pack-year history of smoking and currently smoke or have quit within the past 15 years.  Fecal occult blood test (FOBT) of the stool. You may have this test every year starting at age 52.  Flexible sigmoidoscopy or colonoscopy. You may have a sigmoidoscopy every 5 years or a  colonoscopy every 10 years starting at age 43.  Hepatitis C blood test.  Hepatitis B blood test.  Sexually transmitted disease (STD) testing.  Diabetes screening. This is done by checking your blood sugar (glucose) after you have not eaten for a while (fasting). You may have this done every 1-3 years.  Mammogram. This may be done every 1-2 years. Talk to your health care provider about when you should start having regular mammograms. This may depend on whether you have a family history of breast cancer.  BRCA-related cancer screening. This may be done if you have a family history of breast, ovarian, tubal, or peritoneal cancers.  Pelvic exam and Pap test. This may be done every 3 years starting at age 59. Starting at age 65, this may be done every 5 years if you have a Pap test in combination with an HPV test.  Bone density scan. This is done to screen for osteoporosis. You may have this scan if you are at high risk for osteoporosis.  Discuss your test results, treatment options, and if necessary, the need for more tests with your health care provider. Vaccines Your health care provider may recommend certain vaccines, such as:  Influenza vaccine. This is recommended every year.  Tetanus, diphtheria, and acellular pertussis (Tdap, Td) vaccine. You may need a Td booster every 10 years.  Varicella vaccine. You may need this if you have not been vaccinated.  Zoster vaccine. You may need this after age 66.  Measles, mumps, and rubella (MMR) vaccine. You may need at least one dose of MMR if you  1956 or later. You may also need a second dose.  Pneumococcal 13-valent conjugate (PCV13) vaccine. You may need this if you have certain conditions and were not previously vaccinated.  Pneumococcal polysaccharide (PPSV23) vaccine. You may need one or two doses if you smoke cigarettes or if you have certain conditions.  Meningococcal vaccine. You may need this if you have certain  conditions.  Hepatitis A vaccine. You may need this if you have certain conditions or if you travel or work in places where you may be exposed to hepatitis A.  Hepatitis B vaccine. You may need this if you have certain conditions or if you travel or work in places where you may be exposed to hepatitis B.  Haemophilus influenzae type b (Hib) vaccine. You may need this if you have certain conditions.  Talk to your health care provider about which screenings and vaccines you need and how often you need them. This information is not intended to replace advice given to you by your health care provider. Make sure you discuss any questions you have with your health care provider. Document Released: 07/04/2015 Document Revised: 02/25/2016 Document Reviewed: 04/08/2015 Elsevier Interactive Patient Education  2018 [employer]  

## 2017-10-17 NOTE — Assessment & Plan Note (Signed)
Well controlled, no changes to meds. Encouraged heart healthy diet such as the DASH diet and exercise as tolerated.  °

## 2017-10-17 NOTE — Assessment & Plan Note (Signed)
Check lipid panel today 

## 2017-10-17 NOTE — Progress Notes (Signed)
Subjective:  I acted as a Education administrator for Dr. Charlett Blake. [place], [place]  Patient ID: Kim Fox, female    DOB: 29-Jul-1965, 52 y.o.   MRN: [id number]  Chief Complaint  Patient presents with  . Annual Exam    HPI  Patient is in today for an annual exam and follow up on chronic medical concerns including reflux, hypertension, obesity and anxiety. She is under a great deal of stress as she has moved her aging and ailing parents in to live with her since they had some serious recent acute illness. She is tearful and notes that the rest of the family does not quite understand how advanced her parents conditions have become. Patient is not exercising, sleeping adequately or eating well. Denies CP/palp/SOB/HA/congestion/fevers/GI or GU c/o. Taking meds as prescribed  Patient Care Team: [name], MD as PCP - General (Family Medicine)   Past Medical History:  Diagnosis Date  . Dyslipidemia 10/17/2017  . GERD (gastroesophageal reflux disease)   . Headache(784.0)    migraine  . History of chicken pox 04/10/2015  . Hypertension   . I 04/04/2014  . Menorrhagia   . Multiple renal cysts 08/09/2011  . Murmur, cardiac    Echo WNL 2009  . Obesity   . Polycystic kidney disease   . Preventative health care 04/20/2015  . Situational anxiety 10/17/2017  . Urinary incontinence 10/28/2015    Past Surgical History:  Procedure Laterality Date  . ENDOMETRIAL BIOPSY  2010  . KIDNEY CYST REMOVAL  2002  . TONSILLECTOMY    . URETERAL STENT PLACEMENT  2002    Family History  Problem Relation Age of Onset  . Diabetes Mother        insulin dependent  . Hypertension Mother   . Migraines Mother   . Varicose Veins Mother   . Arthritis Mother   . Diabetes Father   . Hypertension Father   . Kidney disease Father   . Arthritis Father   . Migraines Sister   . Allergies Sister   . Renal Cyst Sister   . Cancer Sister        skin cancer  . Dysfunctional uterine bleeding Sister   . Cancer Paternal  Grandmother        lung  . Cancer Paternal Grandfather        bladder, prostate  . Heart disease Paternal Grandfather   . Hyperlipidemia Daughter   . Fibromyalgia Sister   . Renal Cyst Sister   . Hypertension Sister   . Dysfunctional uterine bleeding Sister     Social History   Socioeconomic History  . Marital status: Divorced    Spouse name: Not on file  . Number of children: 2  . Years of education: college  . Highest education level: Not on file  Occupational History  . Occupation: Aeronautical engineer: [employer]  Social Needs  . Financial resource strain: Not on file  . Food insecurity:    Worry: Not on file    Inability: Not on file  . Transportation needs:    Medical: Not on file    Non-medical: Not on file  Tobacco Use  . Smoking status: Never Smoker  . Smokeless tobacco: Never Used  . Tobacco comment: never used tobacco  Substance and Sexual Activity  . Alcohol use: Yes    Alcohol/week: 0.6 oz    Types: 1 Standard drinks or equivalent per week  . Drug use: No  . Sexual  activity: Not Currently    Comment: lives with daughter and occasionally son, works in [place], no major dietary  Lifestyle  . Physical activity:    Days per week: Not on file    Minutes per session: Not on file  . Stress: Not on file  Relationships  . Social connections:    Talks on phone: Not on file    Gets together: Not on file    Attends religious service: Not on file    Active member of club or organization: Not on file    Attends meetings of clubs or organizations: Not on file    Relationship status: Not on file  . Intimate partner violence:    Fear of current or ex partner: Not on file    Emotionally abused: Not on file    Physically abused: Not on file    Forced sexual activity: Not on file  Other Topics Concern  . Not on file  Social History Narrative  . Not on file    Outpatient Medications Prior to Visit  Medication Sig Dispense Refill  . aspirin  EC 81 MG tablet Take 81 mg by mouth 2 (two) times daily.    . Biotin 5000 MCG CAPS Take 1 capsule by mouth daily.    . Calcium Carbonate-Vitamin D (CALCIUM-VITAMIN D) 600-200 MG-UNIT CAPS Take 1 tablet by mouth 2 (two) times daily.    . folic acid (FOLVITE) 030 MCG tablet Take 400 mcg by mouth daily.    Marland Kitchen L-Lysine 1000 MG TABS Take 1 tablet by mouth daily.    . Multiple Vitamin (MULTIVITAMIN) tablet Take 1 tablet by mouth daily.    . multivitamin-lutein (OCUVITE-LUTEIN) CAPS capsule Take 1 capsule by mouth daily.    . Probiotic Product (ACIDOPHILUS/GOAT MILK) CAPS Take by mouth.    . SUMAtriptan (IMITREX) 100 MG tablet Take at the onset of HA.  May repeat in 2 hours if needed.  Max dose 2/24 hours, 4/week 10 tablet 3  . atenolol (TENORMIN) 50 MG tablet TAKE 1 TABLET(50 MG) BY MOUTH DAILY 90 tablet 2  . hydrOXYzine (ATARAX/VISTARIL) 25 MG tablet Take 1 tablet (25 mg total) by mouth 3 (three) times daily as needed for itching. 30 tablet 0  . predniSONE (DELTASONE) 10 MG tablet 5 tab po day 1, 4 tab po day 2, 3 tab po day 3, 2 tab po day 4, 1 tab po day 5. 15 tablet 0   No facility-administered medications prior to visit.     Allergies  Allergen Reactions  . Nitrofurantoin Rash  . Macrodantin Rash  . Penicillins Rash    Review of Systems  Constitutional: Positive for malaise/fatigue. Negative for chills and fever.  HENT: Negative for congestion and hearing loss.   Eyes: Negative for discharge.  Respiratory: Negative for cough, sputum production and shortness of breath.   Cardiovascular: Negative for chest pain, palpitations and leg swelling.  Gastrointestinal: Negative for abdominal pain, blood in stool, constipation, diarrhea, heartburn, nausea and vomiting.  Genitourinary: Negative for dysuria, frequency, hematuria and urgency.  Musculoskeletal: Negative for back pain, falls and myalgias.  Skin: Negative for rash.  Neurological: Negative for dizziness, sensory change, loss of  consciousness, weakness and headaches.  Endo/Heme/Allergies: Negative for environmental allergies. Does not bruise/bleed easily.  Psychiatric/Behavioral: Negative for depression and suicidal ideas. The patient is nervous/anxious. The patient does not have insomnia.        Objective:    Physical Exam  BP 134/80 (BP Location: Left Arm, Patient Position:  Sitting, Cuff Size: Large)   Pulse 61   Temp 97.6 F (36.4 C) (Oral)   Resp 18   Ht '5\' 7"'  (1.702 m)   Wt 263 lb (119.3 kg)   SpO2 98%   BMI 41.19 kg/m  Wt Readings from Last 3 Encounters:  10/17/17 263 lb (119.3 kg)  12/27/16 252 lb 6.4 oz (114.5 kg)  10/14/16 241 lb (109.3 kg)   BP Readings from Last 3 Encounters:  10/17/17 134/80  12/27/16 135/69  10/14/16 120/82     Immunization History  Administered Date(s) Administered  . Influenza Split 03/18/2011  . Influenza,inj,Quad PF,6+ Mos 04/04/2013, 04/18/2014, 04/10/2015  . Tdap 08/09/2011    Health Maintenance  Topic Date Due  . HIV Screening  05/16/1982  . COLONOSCOPY  05/16/2017  . INFLUENZA VACCINE  01/19/2018  . MAMMOGRAM  10/20/2018  . PAP SMEAR  10/15/2019  . TETANUS/TDAP  08/08/2021    Lab Results  Component Value Date   WBC 8.2 10/17/2017   HGB 15.4 (H) 10/17/2017   HCT 47.2 (H) 10/17/2017   PLT 255.0 10/17/2017   GLUCOSE 104 (H) 10/17/2017   CHOL 160 10/17/2017   TRIG 101.0 10/17/2017   HDL 50.00 10/17/2017   LDLCALC 90 10/17/2017   ALT 16 10/17/2017   AST 20 10/17/2017   NA 140 10/17/2017   K 4.2 10/17/2017   CL 104 10/17/2017   CREATININE 0.78 10/17/2017   BUN 21 10/17/2017   CO2 29 10/17/2017   TSH 1.02 10/17/2017   INR 1.03 03/25/2014   MICROALBUR 1.0 10/17/2017    Lab Results  Component Value Date   TSH 1.02 10/17/2017   Lab Results  Component Value Date   WBC 8.2 10/17/2017   HGB 15.4 (H) 10/17/2017   HCT 47.2 (H) 10/17/2017   MCV 90.6 10/17/2017   PLT 255.0 10/17/2017   Lab Results  Component Value Date   NA 140  10/17/2017   K 4.2 10/17/2017   CHLORIDE 108 10/07/2016   CO2 29 10/17/2017   GLUCOSE 104 (H) 10/17/2017   BUN 21 10/17/2017   CREATININE 0.78 10/17/2017   BILITOT 0.5 10/17/2017   ALKPHOS 66 10/17/2017   AST 20 10/17/2017   ALT 16 10/17/2017   PROT 7.3 10/17/2017   ALBUMIN 3.6 10/17/2017   CALCIUM 9.5 10/17/2017   ANIONGAP 8 10/07/2016   EGFR 80 (L) 10/07/2016   GFR 82.95 10/17/2017   Lab Results  Component Value Date   CHOL 160 10/17/2017   Lab Results  Component Value Date   HDL 50.00 10/17/2017   Lab Results  Component Value Date   LDLCALC 90 10/17/2017   Lab Results  Component Value Date   TRIG 101.0 10/17/2017   Lab Results  Component Value Date   CHOLHDL 3 10/17/2017   No results found for: HGBA1C       Assessment & Plan:   Problem List Items Addressed This Visit    Essential hypertension, benign    Well controlled, no changes to meds. Encouraged heart healthy diet such as the DASH diet and exercise as tolerated.       Relevant Medications   atenolol (TENORMIN) 50 MG tablet   Other Relevant Orders   CBC (Completed)   Comprehensive metabolic panel (Completed)   TSH (Completed)   Microalbumin / creatinine urine ratio (Completed)   Urinalysis (Completed)   Multiple renal cysts    Check cmp, urine micro      Obesity    Encouraged DASH diet,  decrease po intake and increase exercise as tolerated. Needs 7-8 hours of sleep nightly. Avoid trans fats, eat small, frequent meals every 4-5 hours with lean proteins, complex carbs and healthy fats. Minimize simple carbs, bariatric referral      Preventative health care    Patient encouraged to maintain heart healthy diet, regular exercise, adequate sleep. Consider daily probiotics. Take medications as prescribed      Relevant Orders   CBC (Completed)   Dyslipidemia    Check lipid panel today      Relevant Orders   Lipid panel (Completed)   Situational anxiety    Struggling with her two elderly  parents who are very frail at this time. She denies any meds or counselig at this time but will let us know if she changes her mind.          I have discontinued Rilla Pellicane's predniSONE and hydrOXYzine. I am also having her maintain her L-Lysine, Biotin, multivitamin, Calcium-Vitamin D, folic acid, SUMAtriptan, Acidophilus/Goat Milk, aspirin EC, multivitamin-lutein, and atenolol.  Meds ordered this encounter  Medications  . atenolol (TENORMIN) 50 MG tablet    Sig: TAKE 1 TABLET(50 MG) BY MOUTH DAILY    Dispense:  90 tablet    Refill:  2    CMA served as scribe during this visit. History, Physical and Plan performed by medical provider. Documentation and orders reviewed and attested to.  Penni Homans, MD

## 2017-10-17 NOTE — Assessment & Plan Note (Signed)
Patient encouraged to maintain heart healthy diet, regular exercise, adequate sleep. Consider daily probiotics. Take medications as prescribed 

## 2017-10-25 ENCOUNTER — Ambulatory Visit (HOSPITAL_BASED_OUTPATIENT_CLINIC_OR_DEPARTMENT_OTHER)
Admission: RE | Admit: 2017-10-25 | Discharge: 2017-10-25 | Disposition: A | Discharge status: Home / Self Care | Payer: BLUE CROSS/BLUE SHIELD | Source: Ambulatory Visit | Attending: Family Medicine | Admitting: Family Medicine

## 2017-10-25 DIAGNOSIS — Z1231 Encounter for screening mammogram for malignant neoplasm of breast: Secondary | ICD-10-CM | POA: Insufficient documentation

## 2017-11-06 DIAGNOSIS — Z1212 Encounter for screening for malignant neoplasm of rectum: Secondary | ICD-10-CM | POA: Diagnosis not present

## 2017-11-06 DIAGNOSIS — Z1211 Encounter for screening for malignant neoplasm of colon: Secondary | ICD-10-CM | POA: Diagnosis not present

## 2017-11-06 LAB — COLOGUARD: Cologuard: NEGATIVE

## 2017-11-21 ENCOUNTER — Telehealth: Payer: Self-pay | Admitting: *Deleted

## 2017-11-21 NOTE — Telephone Encounter (Signed)
Received results from [facility]; forwarded to provider/SLS 06/03

## 2017-11-24 ENCOUNTER — Encounter: Payer: Self-pay | Admitting: Family Medicine

## 2018-01-11 DIAGNOSIS — F4323 Adjustment disorder with mixed anxiety and depressed mood: Secondary | ICD-10-CM | POA: Diagnosis not present

## 2018-01-25 DIAGNOSIS — F4323 Adjustment disorder with mixed anxiety and depressed mood: Secondary | ICD-10-CM | POA: Diagnosis not present

## 2018-02-08 DIAGNOSIS — F4323 Adjustment disorder with mixed anxiety and depressed mood: Secondary | ICD-10-CM | POA: Diagnosis not present

## 2018-03-01 DIAGNOSIS — F4323 Adjustment disorder with mixed anxiety and depressed mood: Secondary | ICD-10-CM | POA: Diagnosis not present

## 2018-03-14 DIAGNOSIS — F4323 Adjustment disorder with mixed anxiety and depressed mood: Secondary | ICD-10-CM | POA: Diagnosis not present

## 2018-04-19 DIAGNOSIS — F4323 Adjustment disorder with mixed anxiety and depressed mood: Secondary | ICD-10-CM | POA: Diagnosis not present

## 2018-04-20 ENCOUNTER — Encounter: Payer: Self-pay | Admitting: Family Medicine

## 2018-04-20 ENCOUNTER — Ambulatory Visit: Payer: BLUE CROSS/BLUE SHIELD | Admitting: Family Medicine

## 2018-04-20 VITALS — BP 132/78 | HR 63 | Temp 97.9°F | Resp 18 | Wt 271.8 lb

## 2018-04-20 DIAGNOSIS — R1013 Epigastric pain: Secondary | ICD-10-CM

## 2018-04-20 DIAGNOSIS — E785 Hyperlipidemia, unspecified: Secondary | ICD-10-CM | POA: Diagnosis not present

## 2018-04-20 DIAGNOSIS — N951 Menopausal and female climacteric states: Secondary | ICD-10-CM

## 2018-04-20 DIAGNOSIS — I10 Essential (primary) hypertension: Secondary | ICD-10-CM

## 2018-04-20 DIAGNOSIS — K219 Gastro-esophageal reflux disease without esophagitis: Secondary | ICD-10-CM

## 2018-04-20 DIAGNOSIS — D582 Other hemoglobinopathies: Secondary | ICD-10-CM

## 2018-04-20 DIAGNOSIS — E6609 Other obesity due to excess calories: Secondary | ICD-10-CM

## 2018-04-20 DIAGNOSIS — F418 Other specified anxiety disorders: Secondary | ICD-10-CM

## 2018-04-20 LAB — COMPREHENSIVE METABOLIC PANEL
ALT: 23 U/L (ref 0–35)
AST: 24 U/L (ref 0–37)
Albumin: 4 g/dL (ref 3.5–5.2)
Alkaline Phosphatase: 77 U/L (ref 39–117)
BILIRUBIN TOTAL: 0.6 mg/dL (ref 0.2–1.2)
BUN: 22 mg/dL (ref 6–23)
CALCIUM: 10.1 mg/dL (ref 8.4–10.5)
CHLORIDE: 105 meq/L (ref 96–112)
CO2: 31 meq/L (ref 19–32)
CREATININE: 0.85 mg/dL (ref 0.40–1.20)
GFR: 74.96 mL/min (ref >60.00)
GLUCOSE: 108 mg/dL — AB (ref 70–99)
Potassium: 4.5 mEq/L (ref 3.5–5.1)
SODIUM: 141 meq/L (ref 135–145)
Total Protein: 7.4 g/dL (ref 6.0–8.3)

## 2018-04-20 LAB — CBC
HCT: 47.4 % — ABNORMAL HIGH (ref 36.0–46.0)
Hemoglobin: 15.8 g/dL — ABNORMAL HIGH (ref 12.0–15.0)
MCHC: 33.4 g/dL (ref 30.0–36.0)
MCV: 88.7 fl (ref 78.0–100.0)
Platelets: 234 10*3/uL (ref 150.0–400.0)
RBC: 5.34 Mil/uL — ABNORMAL HIGH (ref 3.87–5.11)
RDW: 13.7 % (ref 11.5–15.5)
WBC: 7.8 10*3/uL (ref 4.0–10.5)

## 2018-04-20 LAB — LIPID PANEL
Cholesterol: 186 mg/dL (ref 0–200)
HDL: 52.9 mg/dL (ref >39.00)
LDL CALC: 106 mg/dL — AB (ref 0–99)
NONHDL: 133.25
TRIGLYCERIDES: 137 mg/dL (ref 0.0–149.0)
Total CHOL/HDL Ratio: 4
VLDL: 27.4 mg/dL (ref 0.0–40.0)

## 2018-04-20 LAB — TSH: TSH: 1.22 u[IU]/mL (ref 0.35–4.50)

## 2018-04-20 LAB — H. PYLORI ANTIBODY, IGG: H PYLORI IGG: NEGATIVE

## 2018-04-20 MED ORDER — FAMOTIDINE 40 MG PO TABS: 40.0000 mg | ORAL_TABLET | Freq: Every day | ORAL | 3 refills | Status: DC

## 2018-04-20 MED ORDER — FLUOXETINE HCL 20 MG PO TABS: 10.0000 mg | ORAL_TABLET | Freq: Every day | ORAL | 3 refills | Status: DC

## 2018-04-20 NOTE — Assessment & Plan Note (Signed)
Well controlled, no changes to meds. Encouraged heart healthy diet such as the DASH diet and exercise as tolerated.  °

## 2018-04-20 NOTE — Assessment & Plan Note (Addendum)
Very stressed caring for her aging parents, her son was at [employer] when the shooting occurred. None of her siblings are helping with her parents. Her daughter has moved out and they are estranged at present. She is seeing a Social worker and that is helping. The daughter is living with a sister that is very manipulative. Spent 25 minutes in counseling and caring for patient. Started on Fluoxetine 20 mg tabs, 1/2 tab po daily x 7 days then increase to 1 tab po daily.

## 2018-04-20 NOTE — Patient Instructions (Signed)
Fluoxetine 20 mg tabs, 1/2 tab po daily x 7 days. Then increase to a full 20 mg tab daily as tolerated.    Perimenopause Perimenopause is the time when your body begins to move into the menopause (no menstrual period for 12 straight months). It is a natural process. Perimenopause can begin 2-8 years before the menopause and usually lasts for 1 year after the menopause. During this time, your ovaries may or may not produce an egg. The ovaries vary in their production of estrogen and progesterone hormones each month. This can cause irregular menstrual periods, difficulty getting pregnant, vaginal bleeding between periods, and uncomfortable symptoms. What are the causes?  Irregular production of the ovarian hormones, estrogen and progesterone, and not ovulating every month. Other causes include:  Tumor of the pituitary gland in the brain.  Medical disease that affects the ovaries.  Radiation treatment.  Chemotherapy.  Unknown causes.  Heavy smoking and excessive alcohol intake can bring on perimenopause sooner.  What are the signs or symptoms?  Hot flashes.  Night sweats.  Irregular menstrual periods.  Decreased sex drive.  Vaginal dryness.  Headaches.  Mood swings.  Depression.  Memory problems.  Irritability.  Tiredness.  Weight gain.  Trouble getting pregnant.  The beginning of losing bone cells (osteoporosis).  The beginning of hardening of the arteries (atherosclerosis). How is this diagnosed? Your health care provider will make a diagnosis by analyzing your age, menstrual history, and symptoms. He or she will do a physical exam and note any changes in your body, especially your female organs. Female hormone tests may or may not be helpful depending on the amount of female hormones you produce and when you produce them. However, other hormone tests may be helpful to rule out other problems. How is this treated? In some cases, no treatment is needed. The  decision on whether treatment is necessary during the perimenopause should be made by you and your health care provider based on how the symptoms are affecting you and your lifestyle. Various treatments are available, such as:  Treating individual symptoms with a specific medicine for that symptom.  Herbal medicines that can help specific symptoms.  Counseling.  Group therapy.  Follow these instructions at home:  Keep track of your menstrual periods (when they occur, how heavy they are, how long between periods, and how long they last) as well as your symptoms and when they started.  Only take over-the-counter or prescription medicines as directed by your health care provider.  Sleep and rest.  Exercise.  Eat a diet that contains calcium (good for your bones) and soy (acts like the estrogen hormone).  Do not smoke.  Avoid alcoholic beverages.  Take vitamin supplements as recommended by your health care provider. Taking vitamin E may help in certain cases.  Take calcium and vitamin D supplements to help prevent bone loss.  Group therapy is sometimes helpful.  Acupuncture may help in some cases. Contact a health care provider if:  You have questions about any symptoms you are having.  You need a referral to a specialist (gynecologist, psychiatrist, or psychologist). Get help right away if:  You have vaginal bleeding.  Your period lasts longer than 8 days.  Your periods are recurring sooner than 21 days.  You have bleeding after intercourse.  You have severe depression.  You have pain when you urinate.  You have severe headaches.  You have vision problems. This information is not intended to replace advice given to you by your  health care provider. Make sure you discuss any questions you have with your health care provider. Document Released: 07/15/2004 Document Revised: 11/13/2015 Document Reviewed: 01/04/2013 Elsevier Interactive Patient Education  2017  [employer].

## 2018-04-20 NOTE — Assessment & Plan Note (Signed)
Encouraged heart healthy diet, increase exercise, avoid trans fats, consider a krill oil cap daily 

## 2018-04-20 NOTE — Assessment & Plan Note (Addendum)
Intermittent hot flashes and irritability worse some days than others. Encouraged adequate sleep, hydration. Started on SSRI to see if we can control her symptoms.

## 2018-04-20 NOTE — Assessment & Plan Note (Addendum)
Avoid offending foods, start probiotics. Do not eat large meals in late evening and consider raising head of bed. She is noting some epigastric and LLQ pain in her abdomen when she bends over. Can be sharp when it happens. Started on Famotidine and check H Pylori which is negative. If symptoms do not respond will let us know for referral to gastroenterology

## 2018-04-20 NOTE — Progress Notes (Signed)
Subjective:    Patient ID: Kim Fox, female    DOB: 16-Sep-1965, 52 y.o.   MRN: [id number]  No chief complaint on file.   HPI Patient is in today for follow up. No recent febrile illness or hospitalizations. She is very tearful due to numerous stressors. Very stressed caring for her aging parents, her son was at [employer] when the shooting occurred. None of her siblings are helping with her parents. Her daughter has moved out and they are estranged at present. She is seeing a Social worker and that is helping. The daughter is living with a sister that is very manipulative. Spent 25 minutes in counseling and caring for patient. She notes some epigastric pain sometimes when she bends over. Also notes some LLQ when bending. Denies CP/palp/SOB/HA/congestion/fevers/GI or GU c/o. Taking meds as prescribed  Past Medical History:  Diagnosis Date  . Dyslipidemia 10/17/2017  . GERD (gastroesophageal reflux disease)   . Headache(784.0)    migraine  . History of chicken pox 04/10/2015  . Hypertension   . I 04/04/2014  . Menorrhagia   . Multiple renal cysts 08/09/2011  . Murmur, cardiac    Echo WNL 2009  . Obesity   . Polycystic kidney disease   . Preventative health care 04/20/2015  . Situational anxiety 10/17/2017  . Urinary incontinence 10/28/2015    Past Surgical History:  Procedure Laterality Date  . ENDOMETRIAL BIOPSY  2010  . KIDNEY CYST REMOVAL  2002  . TONSILLECTOMY    . URETERAL STENT PLACEMENT  2002    Family History  Problem Relation Age of Onset  . Diabetes Mother        insulin dependent  . Hypertension Mother   . Migraines Mother   . Varicose Veins Mother   . Arthritis Mother   . Diabetes Father   . Hypertension Father   . Kidney disease Father   . Arthritis Father   . Migraines Sister   . Allergies Sister   . Renal Cyst Sister   . Cancer Sister        skin cancer  . Dysfunctional uterine bleeding Sister   . Cancer Paternal Grandmother        lung  .  Cancer Paternal Grandfather        bladder, prostate  . Heart disease Paternal Grandfather   . Hyperlipidemia Daughter   . Fibromyalgia Sister   . Renal Cyst Sister   . Hypertension Sister   . Dysfunctional uterine bleeding Sister     Social History   Socioeconomic History  . Marital status: Divorced    Spouse name: Not on file  . Number of children: 2  . Years of education: college  . Highest education level: Not on file  Occupational History  . Occupation: Aeronautical engineer: [employer]  Social Needs  . Financial resource strain: Not on file  . Food insecurity:    Worry: Not on file    Inability: Not on file  . Transportation needs:    Medical: Not on file    Non-medical: Not on file  Tobacco Use  . Smoking status: Never Smoker  . Smokeless tobacco: Never Used  . Tobacco comment: never used tobacco  Substance and Sexual Activity  . Alcohol use: Yes    Alcohol/week: 1.0 standard drinks    Types: 1 Standard drinks or equivalent per week  . Drug use: No  . 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 Physical activity:    Days per week: Not on file    Minutes per session: Not on file  . Stress: Not on file  Relationships  . Social connections:    Talks on phone: Not on file    Gets together: Not on file    Attends religious service: Not on file    Active member of club or organization: Not on file    Attends meetings of clubs or organizations: Not on file    Relationship status: Not on file  . Intimate partner violence:    Fear of current or ex partner: Not on file    Emotionally abused: Not on file    Physically abused: Not on file    Forced sexual activity: Not on file  Other Topics Concern  . Not on file  Social History Narrative  . Not on file    Outpatient Medications Prior to Visit  Medication Sig Dispense Refill  . aspirin EC 81 MG tablet  Take 81 mg by mouth 2 (two) times daily.    . atenolol (TENORMIN) 50 MG tablet TAKE 1 TABLET(50 MG) BY MOUTH DAILY 90 tablet 2  . Biotin 5000 MCG CAPS Take 1 capsule by mouth daily.    . Calcium Carbonate-Vitamin D (CALCIUM-VITAMIN D) 600-200 MG-UNIT CAPS Take 1 tablet by mouth 2 (two) times daily.    . folic acid (FOLVITE) 800 MCG tablet Take 400 mcg by mouth daily.    . L-Lysine 1000 MG TABS Take 1 tablet by mouth daily.    . Multiple Vitamin (MULTIVITAMIN) tablet Take 1 tablet by mouth daily.    . multivitamin-lutein (OCUVITE-LUTEIN) CAPS capsule Take 1 capsule by mouth daily.    . Probiotic Product (ACIDOPHILUS/GOAT MILK) CAPS Take by mouth.    . SUMAtriptan (IMITREX) 100 MG tablet Take at the onset of HA.  May repeat in 2 hours if needed.  Max dose 2/24 hours, 4/week 10 tablet 3   No facility-administered medications prior to visit.     Allergies  Allergen Reactions  . Nitrofurantoin Rash  . Macrodantin Rash  . Penicillins Rash    Review of Systems  Constitutional: Positive for malaise/fatigue. Negative for fever.  HENT: Negative for congestion.   Eyes: Negative for blurred vision.  Respiratory: Negative for shortness of breath.   Cardiovascular: Negative for chest pain, palpitations and leg swelling.  Gastrointestinal: Positive for abdominal pain and heartburn. Negative for blood in stool and nausea.  Genitourinary: Negative for dysuria and frequency.  Musculoskeletal: Negative for falls.  Skin: Negative for rash.  Neurological: Negative for dizziness, loss of consciousness and headaches.  Endo/Heme/Allergies: Negative for environmental allergies.  Psychiatric/Behavioral: Positive for depression. The patient is nervous/anxious.        Objective:    Physical Exam  Constitutional: She is oriented to person, place, and time. She appears well-developed and well-nourished. No distress.  HENT:  Head: Normocephalic and atraumatic.  Eyes: Conjunctivae are normal.  Neck: Neck  supple. No thyromegaly present.  Cardiovascular: Normal rate, regular rhythm and normal heart sounds.  No murmur heard. Pulmonary/Chest: Effort normal and breath sounds normal. No respiratory distress.  Abdominal: Soft. Bowel sounds are normal. She exhibits no distension and no mass. There is no tenderness.  Musculoskeletal: She exhibits no edema.  Lymphadenopathy:    She has no cervical adenopathy.  Neurological: She is alert and oriented to person, place, and time.  Skin:   Skin is warm and dry.  Psychiatric: She has a normal mood and affect. Her behavior is normal.    BP 132/78 (BP Location: Left Arm, Patient Position: Sitting, Cuff Size: Normal)   Pulse 63   Temp 97.9 F (36.6 C) (Oral)   Resp 18   Wt 271 lb 12.8 oz (123.3 kg)   SpO2 97%   BMI 42.57 kg/m  Wt Readings from Last 3 Encounters:  04/20/18 271 lb 12.8 oz (123.3 kg)  10/17/17 263 lb (119.3 kg)  12/27/16 252 lb 6.4 oz (114.5 kg)     Lab Results  Component Value Date   WBC 7.8 04/20/2018   HGB 15.8 (H) 04/20/2018   HCT 47.4 (H) 04/20/2018   PLT 234.0 04/20/2018   GLUCOSE 108 (H) 04/20/2018   CHOL 186 04/20/2018   TRIG 137.0 04/20/2018   HDL 52.90 04/20/2018   LDLCALC 106 (H) 04/20/2018   ALT 23 04/20/2018   AST 24 04/20/2018   NA 141 04/20/2018   K 4.5 04/20/2018   CL 105 04/20/2018   CREATININE 0.85 04/20/2018   BUN 22 04/20/2018   CO2 31 04/20/2018   TSH 1.22 04/20/2018   INR 1.03 03/25/2014   MICROALBUR 1.0 10/17/2017    Lab Results  Component Value Date   TSH 1.22 04/20/2018   Lab Results  Component Value Date   WBC 7.8 04/20/2018   HGB 15.8 (H) 04/20/2018   HCT 47.4 (H) 04/20/2018   MCV 88.7 04/20/2018   PLT 234.0 04/20/2018   Lab Results  Component Value Date   NA 141 04/20/2018   K 4.5 04/20/2018   CHLORIDE 108 10/07/2016   CO2 31 04/20/2018   GLUCOSE 108 (H) 04/20/2018   BUN 22 04/20/2018   CREATININE 0.85 04/20/2018   BILITOT 0.6 04/20/2018   ALKPHOS 77 04/20/2018   AST 24  04/20/2018   ALT 23 04/20/2018   PROT 7.4 04/20/2018   ALBUMIN 4.0 04/20/2018   CALCIUM 10.1 04/20/2018   ANIONGAP 8 10/07/2016   EGFR 80 (L) 10/07/2016   GFR 74.96 04/20/2018   Lab Results  Component Value Date   CHOL 186 04/20/2018   Lab Results  Component Value Date   HDL 52.90 04/20/2018   Lab Results  Component Value Date   LDLCALC 106 (H) 04/20/2018   Lab Results  Component Value Date   TRIG 137.0 04/20/2018   Lab Results  Component Value Date   CHOLHDL 4 04/20/2018   No results found for: HGBA1C     Assessment & Plan:   Problem List Items Addressed This Visit    Essential hypertension, benign    Well controlled, no changes to meds. Encouraged heart healthy diet such as the DASH diet and exercise as tolerated.       Relevant Orders   CBC (Completed)   Comprehensive metabolic panel (Completed)   TSH (Completed)   GERD (gastroesophageal reflux disease)    Avoid offending foods, start probiotics. Do not eat large meals in late evening and consider raising head of bed. She is noting some epigastric and LLQ pain in her abdomen when she bends over. Can be sharp when it happens. Started on Famotidine and check H Pylori which is negative. If symptoms do not respond will let us know for referral to gastroenterology      Relevant Medications   famotidine (PEPCID) 40 MG tablet   Obesity    Encouraged DASH diet, decrease po intake and increase exercise as tolerated. Needs 7-8 hours of   sleep nightly. Avoid trans fats, eat small, frequent meals every 4-5 hours with lean proteins, complex carbs and healthy fats. Minimize simple carbs, consider Healthy Weight and Wellness       Relevant Orders   Amb Ref to Medical Weight Management   Dyslipidemia    Encouraged heart healthy diet, increase exercise, avoid trans fats, consider a krill oil cap daily      Relevant Orders   Lipid panel (Completed)   Perimenopausal    Intermittent hot flashes and irritability worse some  days than others. Encouraged adequate sleep, hydration. Started on SSRI to see if we can control her symptoms.       Depression with anxiety    Very stressed caring for her aging parents, her son was at [employer] when the shooting occurred. None of her siblings are helping with her parents. Her daughter has moved out and they are estranged at present. She is seeing a Social worker and that is helping. The daughter is living with a sister that is very manipulative. Spent 25 minutes in counseling and caring for patient. Started on Fluoxetine 20 mg tabs, 1/2 tab po daily x 7 days then increase to 1 tab po daily.       Relevant Medications   FLUoxetine (PROZAC) 20 MG tablet   Abnormal hemoglobin (HCC)    Persistent but mild. Consider hematology consultation given her history of blood clot in past. Patient will let us know if she is willing to proceed.        Other Visit Diagnoses    Epigastric pain    -  Primary   Relevant Orders   H. pylori antibody, IgG (Completed)      I am having Kim Fox "Kim Fox" start on FLUoxetine and famotidine. I am also having her maintain her L-Lysine, Biotin, multivitamin, Calcium-Vitamin D, folic acid, SUMAtriptan, Acidophilus/Goat Milk, aspirin EC, multivitamin-lutein, and atenolol.  Meds ordered this encounter  Medications  . FLUoxetine (PROZAC) 20 MG tablet    Sig: Take 0.5-1 tablets (10-20 mg total) by mouth daily.    Dispense:  30 tablet    Refill:  3  . famotidine (PEPCID) 40 MG tablet    Sig: Take 1 tablet (40 mg total) by mouth daily.    Dispense:  30 tablet    Refill:  3     Penni Homans, MD

## 2018-04-20 NOTE — Assessment & Plan Note (Signed)
Encouraged DASH diet, decrease po intake and increase exercise as tolerated. Needs 7-8 hours of sleep nightly. Avoid trans fats, eat small, frequent meals every 4-5 hours with lean proteins, complex carbs and healthy fats. Minimize simple carbs, consider Healthy Weight and Wellness

## 2018-04-23 DIAGNOSIS — D582 Other hemoglobinopathies: Secondary | ICD-10-CM | POA: Insufficient documentation

## 2018-04-23 NOTE — Assessment & Plan Note (Signed)
Persistent but mild. Consider hematology consultation given her history of blood clot in past. Patient will let us know if she is willing to proceed.

## 2018-04-28 ENCOUNTER — Encounter: Payer: Self-pay | Admitting: Family Medicine

## 2018-05-09 DIAGNOSIS — F4323 Adjustment disorder with mixed anxiety and depressed mood: Secondary | ICD-10-CM | POA: Diagnosis not present

## 2018-05-26 ENCOUNTER — Encounter: Payer: Self-pay | Admitting: Family Medicine

## 2018-05-26 DIAGNOSIS — D582 Other hemoglobinopathies: Secondary | ICD-10-CM

## 2018-05-26 MED ORDER — FLUOXETINE HCL 20 MG PO TABS: 10.0000 mg | ORAL_TABLET | Freq: Every day | ORAL | 1 refills | Status: DC

## 2018-06-07 ENCOUNTER — Other Ambulatory Visit (INDEPENDENT_AMBULATORY_CARE_PROVIDER_SITE_OTHER): Payer: BLUE CROSS/BLUE SHIELD

## 2018-06-07 DIAGNOSIS — D582 Other hemoglobinopathies: Secondary | ICD-10-CM

## 2018-06-07 DIAGNOSIS — F4323 Adjustment disorder with mixed anxiety and depressed mood: Secondary | ICD-10-CM | POA: Diagnosis not present

## 2018-06-07 LAB — CBC WITH DIFFERENTIAL/PLATELET
Basophils Absolute: 0.1 10*3/uL (ref 0.0–0.1)
Basophils Relative: 1 % (ref 0.0–3.0)
Eosinophils Absolute: 0.1 10*3/uL (ref 0.0–0.7)
Eosinophils Relative: 2 % (ref 0.0–5.0)
HCT: 47.8 % — ABNORMAL HIGH (ref 36.0–46.0)
Hemoglobin: 15.8 g/dL — ABNORMAL HIGH (ref 12.0–15.0)
Lymphocytes Relative: 30 % (ref 12.0–46.0)
Lymphs Abs: 2.2 10*3/uL (ref 0.7–4.0)
MCHC: 33 g/dL (ref 30.0–36.0)
MCV: 89.7 fl (ref 78.0–100.0)
Monocytes Absolute: 0.8 10*3/uL (ref 0.1–1.0)
Monocytes Relative: 10.4 % (ref 3.0–12.0)
Neutro Abs: 4.2 10*3/uL (ref 1.4–7.7)
Neutrophils Relative %: 56.6 % (ref 43.0–77.0)
Platelets: 223 10*3/uL (ref 150.0–400.0)
RBC: 5.33 Mil/uL — AB (ref 3.87–5.11)
RDW: 14.5 % (ref 11.5–15.5)
WBC: 7.5 10*3/uL (ref 4.0–10.5)

## 2018-06-07 LAB — RETICULOCYTES
ABS RETIC: 80400 {cells}/uL — AB (ref 20000–8000)
RETIC CT PCT: 1.5 %

## 2018-07-01 DIAGNOSIS — F4323 Adjustment disorder with mixed anxiety and depressed mood: Secondary | ICD-10-CM | POA: Diagnosis not present

## 2018-07-14 DIAGNOSIS — F4323 Adjustment disorder with mixed anxiety and depressed mood: Secondary | ICD-10-CM | POA: Diagnosis not present

## 2018-07-27 ENCOUNTER — Ambulatory Visit: Payer: BLUE CROSS/BLUE SHIELD | Admitting: Family Medicine

## 2018-07-27 ENCOUNTER — Encounter: Payer: Self-pay | Admitting: Family Medicine

## 2018-07-27 VITALS — BP 130/78 | HR 66 | Temp 97.8°F | Resp 16 | Ht 67.0 in | Wt 268.4 lb

## 2018-07-27 DIAGNOSIS — R0683 Snoring: Secondary | ICD-10-CM | POA: Diagnosis not present

## 2018-07-27 DIAGNOSIS — E6609 Other obesity due to excess calories: Secondary | ICD-10-CM

## 2018-07-27 DIAGNOSIS — I10 Essential (primary) hypertension: Secondary | ICD-10-CM | POA: Diagnosis not present

## 2018-07-27 DIAGNOSIS — R739 Hyperglycemia, unspecified: Secondary | ICD-10-CM | POA: Diagnosis not present

## 2018-07-27 DIAGNOSIS — M79672 Pain in left foot: Secondary | ICD-10-CM

## 2018-07-27 DIAGNOSIS — K219 Gastro-esophageal reflux disease without esophagitis: Secondary | ICD-10-CM

## 2018-07-27 DIAGNOSIS — E785 Hyperlipidemia, unspecified: Secondary | ICD-10-CM | POA: Diagnosis not present

## 2018-07-27 DIAGNOSIS — L989 Disorder of the skin and subcutaneous tissue, unspecified: Secondary | ICD-10-CM

## 2018-07-27 DIAGNOSIS — D582 Other hemoglobinopathies: Secondary | ICD-10-CM | POA: Diagnosis not present

## 2018-07-27 DIAGNOSIS — M79671 Pain in right foot: Secondary | ICD-10-CM

## 2018-07-27 DIAGNOSIS — D751 Secondary polycythemia: Secondary | ICD-10-CM

## 2018-07-27 MED ORDER — ATENOLOL 50 MG PO TABS: ORAL_TABLET | ORAL | 2 refills | Status: DC

## 2018-07-27 MED ORDER — FLUOXETINE HCL 20 MG PO TABS: 20.0000 mg | ORAL_TABLET | Freq: Every day | ORAL | 1 refills | Status: DC

## 2018-07-27 MED ORDER — FAMOTIDINE 40 MG PO TABS: 20.0000 mg | ORAL_TABLET | Freq: Every evening | ORAL | 1 refills | Status: DC | PRN

## 2018-07-27 NOTE — Assessment & Plan Note (Signed)
Encouraged heart healthy diet, increase exercise, avoid trans fats, consider a krill oil cap daily 

## 2018-07-27 NOTE — Patient Instructions (Addendum)
[facility]  Dr [employer] inserts  Aspercreme aspirin and lidocaine gel  Twice daily and ice   Hypertension Hypertension, commonly called high blood pressure, is when the force of blood pumping through the arteries is too strong. The arteries are the blood vessels that carry blood from the heart throughout the body. Hypertension forces the heart to work harder to pump blood and may cause arteries to become narrow or stiff. Having untreated or uncontrolled hypertension can cause heart attacks, strokes, kidney disease, and other problems. A blood pressure reading consists of a higher number over a lower number. Ideally, your blood pressure should be below 120/80. The first ("top") number is called the systolic pressure. It is a measure of the pressure in your arteries as your heart beats. The second ("bottom") number is called the diastolic pressure. It is a measure of the pressure in your arteries as the heart relaxes. What are the causes? The cause of this condition is not known. What increases the risk? Some risk factors for high blood pressure are under your control. Others are not. Factors you can change  Smoking.  Having type 2 diabetes mellitus, high cholesterol, or both.  Not getting enough exercise or physical activity.  Being overweight.  Having too much fat, sugar, calories, or salt (sodium) in your diet.  Drinking too much alcohol. Factors that are difficult or impossible to change  Having chronic kidney disease.  Having a family history of high blood pressure.  Age. Risk increases with age.  Race. You may be at higher risk if you are African-American.  Gender. Men are at higher risk than women before age 31. After age 29, women are at higher risk than men.  Having obstructive sleep apnea.  Stress. What are the signs or symptoms? Extremely high blood pressure (hypertensive crisis) may cause:  Headache.  Anxiety.  Shortness of  breath.  Nosebleed.  Nausea and vomiting.  Severe chest pain.  Jerky movements you cannot control (seizures). How is this diagnosed? This condition is diagnosed by measuring your blood pressure while you are seated, with your arm resting on a surface. The cuff of the blood pressure monitor will be placed directly against the skin of your upper arm at the level of your heart. It should be measured at least twice using the same arm. Certain conditions can cause a difference in blood pressure between your right and left arms. Certain factors can cause blood pressure readings to be lower or higher than normal (elevated) for a short period of time:  When your blood pressure is higher when you are in a health care provider's office than when you are at home, this is called white coat hypertension. Most people with this condition do not need medicines.  When your blood pressure is higher at home than when you are in a health care provider's office, this is called masked hypertension. Most people with this condition may need medicines to control blood pressure. If you have a high blood pressure reading during one visit or you have normal blood pressure with other risk factors:  You may be asked to return on a different day to have your blood pressure checked again.  You may be asked to monitor your blood pressure at home for 1 week or longer. If you are diagnosed with hypertension, you may have other blood or imaging tests to help your health care provider understand your overall risk for other conditions. How is this treated? This condition is treated by  making healthy lifestyle changes, such as eating healthy foods, exercising more, and reducing your alcohol intake. Your health care provider may prescribe medicine if lifestyle changes are not enough to get your blood pressure under control, and if:  Your systolic blood pressure is above 130.  Your diastolic blood pressure is above 80. Your  personal target blood pressure may vary depending on your medical conditions, your age, and other factors. Follow these instructions at home: Eating and drinking   Eat a diet that is high in fiber and potassium, and low in sodium, added sugar, and fat. An example eating plan is called the DASH (Dietary Approaches to Stop Hypertension) diet. To eat this way: ? Eat plenty of fresh fruits and vegetables. Try to fill half of your plate at each meal with fruits and vegetables. ? Eat whole grains, such as whole wheat pasta, brown rice, or whole grain bread. Fill about one quarter of your plate with whole grains. ? Eat or drink low-fat dairy products, such as skim milk or low-fat yogurt. ? Avoid fatty cuts of meat, processed or cured meats, and poultry with skin. Fill about one quarter of your plate with lean proteins, such as fish, chicken without skin, beans, eggs, and tofu. ? Avoid premade and processed foods. These tend to be higher in sodium, added sugar, and fat.  Reduce your daily sodium intake. Most people with hypertension should eat less than 1,500 mg of sodium a day.  Limit alcohol intake to no more than 1 drink a day for nonpregnant women and 2 drinks a day for men. One drink equals 12 oz of beer, 5 oz of wine, or 1 oz of hard liquor. Lifestyle   Work with your health care provider to maintain a healthy body weight or to lose weight. Ask what an ideal weight is for you.  Get at least 30 minutes of exercise that causes your heart to beat faster (aerobic exercise) most days of the week. Activities may include walking, swimming, or biking.  Include exercise to strengthen your muscles (resistance exercise), such as pilates or lifting weights, as part of your weekly exercise routine. Try to do these types of exercises for 30 minutes at least 3 days a week.  Do not use any products that contain nicotine or tobacco, such as cigarettes and e-cigarettes. If you need help quitting, ask your  health care provider.  Monitor your blood pressure at home as told by your health care provider.  Keep all follow-up visits as told by your health care provider. This is important. Medicines  Take over-the-counter and prescription medicines only as told by your health care provider. Follow directions carefully. Blood pressure medicines must be taken as prescribed.  Do not skip doses of blood pressure medicine. Doing this puts you at risk for problems and can make the medicine less effective.  Ask your health care provider about side effects or reactions to medicines that you should watch for. Contact a health care provider if:  You think you are having a reaction to a medicine you are taking.  You have headaches that keep coming back (recurring).  You feel dizzy.  You have swelling in your ankles.  You have trouble with your vision. Get help right away if:  You develop a severe headache or confusion.  You have unusual weakness or numbness.  You feel faint.  You have severe pain in your chest or abdomen.  You vomit repeatedly.  You have trouble breathing. Summary  Hypertension  is when the force of blood pumping through your arteries is too strong. If this condition is not controlled, it may put you at risk for serious complications.  Your personal target blood pressure may vary depending on your medical conditions, your age, and other factors. For most people, a normal blood pressure is less than 120/80.  Hypertension is treated with lifestyle changes, medicines, or a combination of both. Lifestyle changes include weight loss, eating a healthy, low-sodium diet, exercising more, and limiting alcohol. This information is not intended to replace advice given to you by your health care provider. Make sure you discuss any questions you have with your health care provider. Document Released: 06/07/2005 Document Revised: 05/05/2016 Document Reviewed: 05/05/2016 Elsevier  Interactive Patient Education  2019 [employer].

## 2018-07-27 NOTE — Assessment & Plan Note (Signed)
Check cbc 

## 2018-07-27 NOTE — Assessment & Plan Note (Signed)
Well controlled, no changes to meds. Encouraged heart healthy diet such as the DASH diet and exercise as tolerated.  °

## 2018-07-27 NOTE — Assessment & Plan Note (Signed)
hgba1c acceptable, minimize simple carbs. Increase exercise as tolerated.  

## 2018-07-27 NOTE — Assessment & Plan Note (Signed)
Encouraged DASH diet, decrease po intake and increase exercise as tolerated. Needs 7-8 hours of sleep nightly. Avoid trans fats, eat small, frequent meals every 4-5 hours with lean proteins, complex carbs and healthy fats. Minimize simple carbs 

## 2018-07-28 LAB — LIPID PANEL
Cholesterol: 181 mg/dL (ref 0–200)
HDL: 38.9 mg/dL — AB (ref >39.00)
NonHDL: 142.22
Total CHOL/HDL Ratio: 5
Triglycerides: 248 mg/dL — ABNORMAL HIGH (ref 0.0–149.0)
VLDL: 49.6 mg/dL — ABNORMAL HIGH (ref 0.0–40.0)

## 2018-07-28 LAB — COMPREHENSIVE METABOLIC PANEL
ALT: 17 U/L (ref 0–35)
AST: 18 U/L (ref 0–37)
Albumin: 4 g/dL (ref 3.5–5.2)
Alkaline Phosphatase: 84 U/L (ref 39–117)
BUN: 23 mg/dL (ref 6–23)
CO2: 28 mEq/L (ref 19–32)
Calcium: 9.3 mg/dL (ref 8.4–10.5)
Chloride: 105 mEq/L (ref 96–112)
Creatinine, Ser: 0.98 mg/dL (ref 0.40–1.20)
GFR: 59.78 mL/min — ABNORMAL LOW (ref >60.00)
Glucose, Bld: 111 mg/dL — ABNORMAL HIGH (ref 70–99)
Potassium: 4.3 mEq/L (ref 3.5–5.1)
Sodium: 142 mEq/L (ref 135–145)
TOTAL PROTEIN: 7 g/dL (ref 6.0–8.3)
Total Bilirubin: 0.3 mg/dL (ref 0.2–1.2)

## 2018-07-28 LAB — CBC WITH DIFFERENTIAL/PLATELET
Basophils Absolute: 0.1 10*3/uL (ref 0.0–0.1)
Basophils Relative: 1.1 % (ref 0.0–3.0)
EOS PCT: 1.9 % (ref 0.0–5.0)
Eosinophils Absolute: 0.2 10*3/uL (ref 0.0–0.7)
HEMATOCRIT: 46.8 % — AB (ref 36.0–46.0)
Hemoglobin: 15.6 g/dL — ABNORMAL HIGH (ref 12.0–15.0)
LYMPHS PCT: 30 % (ref 12.0–46.0)
Lymphs Abs: 2.8 10*3/uL (ref 0.7–4.0)
MCHC: 33.3 g/dL (ref 30.0–36.0)
MCV: 88.4 fl (ref 78.0–100.0)
Monocytes Absolute: 0.9 10*3/uL (ref 0.1–1.0)
Monocytes Relative: 9.8 % (ref 3.0–12.0)
Neutro Abs: 5.4 10*3/uL (ref 1.4–7.7)
Neutrophils Relative %: 57.2 % (ref 43.0–77.0)
Platelets: 244 10*3/uL (ref 150.0–400.0)
RBC: 5.29 Mil/uL — ABNORMAL HIGH (ref 3.87–5.11)
RDW: 14 % (ref 11.5–15.5)
WBC: 9.4 10*3/uL (ref 4.0–10.5)

## 2018-07-28 LAB — HEMOGLOBIN A1C: Hgb A1c MFr Bld: 5.8 % (ref 4.6–6.5)

## 2018-07-28 LAB — TSH: TSH: 1.08 u[IU]/mL (ref 0.35–4.50)

## 2018-07-28 LAB — LDL CHOLESTEROL, DIRECT: Direct LDL: 117 mg/dL

## 2018-07-29 DIAGNOSIS — F4323 Adjustment disorder with mixed anxiety and depressed mood: Secondary | ICD-10-CM | POA: Diagnosis not present

## 2018-07-30 DIAGNOSIS — R0683 Snoring: Secondary | ICD-10-CM | POA: Insufficient documentation

## 2018-07-30 DIAGNOSIS — M79672 Pain in left foot: Secondary | ICD-10-CM

## 2018-07-30 DIAGNOSIS — L989 Disorder of the skin and subcutaneous tissue, unspecified: Secondary | ICD-10-CM | POA: Insufficient documentation

## 2018-07-30 DIAGNOSIS — M79671 Pain in right foot: Secondary | ICD-10-CM | POA: Insufficient documentation

## 2018-07-30 DIAGNOSIS — D751 Secondary polycythemia: Secondary | ICD-10-CM | POA: Insufficient documentation

## 2018-07-30 NOTE — Assessment & Plan Note (Signed)
With polycythemia, hypertension. Referred to pulmonology for possible sleep study

## 2018-07-30 NOTE — Assessment & Plan Note (Signed)
Avoid offending foods, start probiotics. Do not eat large meals in late evening and consider raising head of bed. Famotidine prn °

## 2018-07-30 NOTE — Assessment & Plan Note (Signed)
Referred to dermatology for further consideration.  

## 2018-07-30 NOTE — Assessment & Plan Note (Signed)
Arches are falling and she has burning pain under them. Encouraged ice, topical rubs, stretching and shoe inserts. Consider podiatry if does not improve

## 2018-07-30 NOTE — Assessment & Plan Note (Signed)
Continue to monitor and consider referral if worsens

## 2018-07-30 NOTE — Progress Notes (Signed)
Subjective:    Patient ID: Kim Fox, female    DOB: 1966/05/07, 53 y.o.   MRN: [id number]  Chief Complaint  Patient presents with  . Follow-up    3 month follow up burning both feet at night     HPI Patient is in today for follow up.  Her biggest complaint is of pain under both of arches in her feet.  She describes it as a burning sensation and it is worse after walking.  No falls or injury.  No redness or warmth.  No recent febrile illness but she does acknowledge significant fatigue and snoring. Denies CP/palp/SOB/HA/congestion/fevers/GI or GU c/o. Taking meds as prescribed  Past Medical History:  Diagnosis Date  . Dyslipidemia 10/17/2017  . GERD (gastroesophageal reflux disease)   . Headache(784.0)    migraine  . History of chicken pox 04/10/2015  . Hypertension   . I 04/04/2014  . Menorrhagia   . Multiple renal cysts 08/09/2011  . Murmur, cardiac    Echo WNL 2009  . Obesity   . Polycystic kidney disease   . Preventative health care 04/20/2015  . Situational anxiety 10/17/2017  . Urinary incontinence 10/28/2015    Past Surgical History:  Procedure Laterality Date  . ENDOMETRIAL BIOPSY  2010  . KIDNEY CYST REMOVAL  2002  . TONSILLECTOMY    . URETERAL STENT PLACEMENT  2002    Family History  Problem Relation Age of Onset  . Diabetes Mother        insulin dependent  . Hypertension Mother   . Migraines Mother   . Varicose Veins Mother   . Arthritis Mother   . Diabetes Father   . Hypertension Father   . Kidney disease Father   . Arthritis Father   . Migraines Sister   . Allergies Sister   . Renal Cyst Sister   . Cancer Sister        skin cancer  . Dysfunctional uterine bleeding Sister   . Cancer Paternal Grandmother        lung  . Cancer Paternal Grandfather        bladder, prostate  . Heart disease Paternal Grandfather   . Hyperlipidemia Daughter   . Fibromyalgia Sister   . Renal Cyst Sister   . Hypertension Sister   . Dysfunctional uterine  bleeding Sister     Social History   Socioeconomic History  . Marital status: Divorced    Spouse name: Not on file  . Number of children: 2  . Years of education: college  . Highest education level: Not on file  Occupational History  . Occupation: Aeronautical engineer: [employer]  Social Needs  . Financial resource strain: Not on file  . Food insecurity:    Worry: Not on file    Inability: Not on file  . Transportation needs:    Medical: Not on file    Non-medical: Not on file  Tobacco Use  . Smoking status: Never Smoker  . Smokeless tobacco: Never Used  . Tobacco comment: never used tobacco  Substance and Sexual Activity  . Alcohol use: Yes    Alcohol/week: 1.0 standard drinks    Types: 1 Standard drinks or equivalent per week  . Drug use: No  . Sexual activity: Not Currently    Comment: lives with daughter and occasionally son, works in [place], no major dietary  Lifestyle  . Physical activity:    Days per week: Not on file  Minutes per session: Not on file  . Stress: Not on file  Relationships  . Social connections:    Talks on phone: Not on file    Gets together: Not on file    Attends religious service: Not on file    Active member of club or organization: Not on file    Attends meetings of clubs or organizations: Not on file    Relationship status: Not on file  . Intimate partner violence:    Fear of current or ex partner: Not on file    Emotionally abused: Not on file    Physically abused: Not on file    Forced sexual activity: Not on file  Other Topics Concern  . Not on file  Social History Narrative  . Not on file    Outpatient Medications Prior to Visit  Medication Sig Dispense Refill  . aspirin EC 81 MG tablet Take 81 mg by mouth 2 (two) times daily.    . Biotin 5000 MCG CAPS Take 1 capsule by mouth daily.    . Calcium Carbonate-Vitamin D (CALCIUM-VITAMIN D) 600-200 MG-UNIT CAPS Take 1 tablet by mouth 2 (two) times daily.    .  folic acid (FOLVITE) 109 MCG tablet Take 400 mcg by mouth daily.    Marland Kitchen L-Lysine 1000 MG TABS Take 1 tablet by mouth daily.    . Multiple Vitamin (MULTIVITAMIN) tablet Take 1 tablet by mouth daily.    . multivitamin-lutein (OCUVITE-LUTEIN) CAPS capsule Take 1 capsule by mouth daily.    . Probiotic Product (ACIDOPHILUS/GOAT MILK) CAPS Take by mouth.    . SUMAtriptan (IMITREX) 100 MG tablet Take at the onset of HA.  May repeat in 2 hours if needed.  Max dose 2/24 hours, 4/week 10 tablet 3  . atenolol (TENORMIN) 50 MG tablet TAKE 1 TABLET(50 MG) BY MOUTH DAILY 90 tablet 2  . famotidine (PEPCID) 40 MG tablet Take 1 tablet (40 mg total) by mouth daily. 30 tablet 3  . FLUoxetine (PROZAC) 20 MG tablet Take 0.5-1 tablets (10-20 mg total) by mouth daily. 90 tablet 1   No facility-administered medications prior to visit.     Allergies  Allergen Reactions  . Nitrofurantoin Rash  . Macrodantin Rash  . Penicillins Rash    Review of Systems  Constitutional: Positive for malaise/fatigue. Negative for fever.  HENT: Negative for congestion.   Eyes: Negative for blurred vision.  Respiratory: Negative for shortness of breath.   Cardiovascular: Negative for chest pain, palpitations and leg swelling.  Gastrointestinal: Negative for abdominal pain, blood in stool and nausea.  Genitourinary: Negative for dysuria and frequency.  Musculoskeletal: Positive for joint pain. Negative for falls.  Skin: Negative for rash.  Neurological: Negative for dizziness, loss of consciousness and headaches.  Endo/Heme/Allergies: Negative for environmental allergies.  Psychiatric/Behavioral: Negative for depression. The patient is not nervous/anxious.        Objective:    Physical Exam Vitals signs and nursing note reviewed.  Constitutional:      General: She is not in acute distress.    Appearance: She is well-developed.  HENT:     Head: Normocephalic and atraumatic.     Nose: Nose normal.  Eyes:     General:          Right eye: No discharge.        Left eye: No discharge.  Neck:     Musculoskeletal: Normal range of motion and neck supple.  Cardiovascular:     Rate and Rhythm: Normal  rate and regular rhythm.     Heart sounds: No murmur.  Pulmonary:     Effort: Pulmonary effort is normal.     Breath sounds: Normal breath sounds.  Abdominal:     General: Bowel sounds are normal.     Palpations: Abdomen is soft.     Tenderness: There is no abdominal tenderness.  Musculoskeletal:     Comments: Flat feet  Skin:    General: Skin is warm and dry.  Neurological:     Mental Status: She is alert and oriented to person, place, and time.     BP 130/78   Pulse 66   Temp 97.8 F (36.6 C) (Oral)   Resp 16   Ht '5\' 7"'  (1.702 m)   Wt 268 lb 6.4 oz (121.7 kg)   SpO2 98%   BMI 42.04 kg/m  Wt Readings from Last 3 Encounters:  07/27/18 268 lb 6.4 oz (121.7 kg)  04/20/18 271 lb 12.8 oz (123.3 kg)  10/17/17 263 lb (119.3 kg)     Lab Results  Component Value Date   WBC 9.4 07/27/2018   HGB 15.6 (H) 07/27/2018   HCT 46.8 (H) 07/27/2018   PLT 244.0 07/27/2018   GLUCOSE 111 (H) 07/27/2018   CHOL 181 07/27/2018   TRIG 248.0 (H) 07/27/2018   HDL 38.90 (L) 07/27/2018   LDLDIRECT 117.0 07/27/2018   LDLCALC 106 (H) 04/20/2018   ALT 17 07/27/2018   AST 18 07/27/2018   NA 142 07/27/2018   K 4.3 07/27/2018   CL 105 07/27/2018   CREATININE 0.98 07/27/2018   BUN 23 07/27/2018   CO2 28 07/27/2018   TSH 1.08 07/27/2018   INR 1.03 03/25/2014   HGBA1C 5.8 07/27/2018   MICROALBUR 1.0 10/17/2017    Lab Results  Component Value Date   TSH 1.08 07/27/2018   Lab Results  Component Value Date   WBC 9.4 07/27/2018   HGB 15.6 (H) 07/27/2018   HCT 46.8 (H) 07/27/2018   MCV 88.4 07/27/2018   PLT 244.0 07/27/2018   Lab Results  Component Value Date   NA 142 07/27/2018   K 4.3 07/27/2018   CHLORIDE 108 10/07/2016   CO2 28 07/27/2018   GLUCOSE 111 (H) 07/27/2018   BUN 23 07/27/2018   CREATININE  0.98 07/27/2018   BILITOT 0.3 07/27/2018   ALKPHOS 84 07/27/2018   AST 18 07/27/2018   ALT 17 07/27/2018   PROT 7.0 07/27/2018   ALBUMIN 4.0 07/27/2018   CALCIUM 9.3 07/27/2018   ANIONGAP 8 10/07/2016   EGFR 80 (L) 10/07/2016   GFR 59.78 (L) 07/27/2018   Lab Results  Component Value Date   CHOL 181 07/27/2018   Lab Results  Component Value Date   HDL 38.90 (L) 07/27/2018   Lab Results  Component Value Date   LDLCALC 106 (H) 04/20/2018   Lab Results  Component Value Date   TRIG 248.0 (H) 07/27/2018   Lab Results  Component Value Date   CHOLHDL 5 07/27/2018   Lab Results  Component Value Date   HGBA1C 5.8 07/27/2018       Assessment & Plan:   Problem List Items Addressed This Visit    Essential hypertension, benign    Well controlled, no changes to meds. Encouraged heart healthy diet such as the DASH diet and exercise as tolerated.       Relevant Medications   atenolol (TENORMIN) 50 MG tablet   Other Relevant Orders   TSH (Completed)   GERD (  gastroesophageal reflux disease)    Avoid offending foods, start probiotics. Do not eat large meals in late evening and consider raising head of bed. Famotidine prn      Relevant Medications   famotidine (PEPCID) 40 MG tablet   Obesity    Encouraged DASH diet, decrease po intake and increase exercise as tolerated. Needs 7-8 hours of sleep nightly. Avoid trans fats, eat small, frequent meals every 4-5 hours with lean proteins, complex carbs and healthy fats. Minimize simple carbs      Relevant Orders   Ambulatory referral to Pulmonology   Dyslipidemia    Encouraged heart healthy diet, increase exercise, avoid trans fats, consider a krill oil cap daily      Relevant Orders   Hemoglobin A1c (Completed)   Lipid panel (Completed)   RESOLVED: Abnormal hemoglobin (HCC)    Check cbc      Relevant Orders   CBC with Differential/Platelet (Completed)   Hyperglycemia    hgba1c acceptable, minimize simple carbs.  Increase exercise as tolerated.       Relevant Orders   Comprehensive metabolic panel (Completed)   Polycythemia    Continue to monitor and consider referral if worsens      Relevant Orders   Ambulatory referral to Pulmonology   Skin lesion    Referred to dermatology for further consideration      Relevant Orders   Ambulatory referral to Dermatology   Snoring - Primary    With polycythemia, hypertension. Referred to pulmonology for possible sleep study      Relevant Orders   Ambulatory referral to Pulmonology   Foot pain, bilateral    Arches are falling and she has burning pain under them. Encouraged ice, topical rubs, stretching and shoe inserts. Consider podiatry if does not improve         I have changed Kim Fox "Cathy"'s FLUoxetine and famotidine. I am also having her maintain her L-Lysine, Biotin, multivitamin, Calcium-Vitamin D, folic acid, SUMAtriptan, Acidophilus/Goat Milk, aspirin EC, multivitamin-lutein, and atenolol.  Meds ordered this encounter  Medications  . atenolol (TENORMIN) 50 MG tablet    Sig: TAKE 1 TABLET(50 MG) BY MOUTH DAILY    Dispense:  90 tablet    Refill:  2  . FLUoxetine (PROZAC) 20 MG tablet    Sig: Take 1 tablet (20 mg total) by mouth daily.    Dispense:  90 tablet    Refill:  1  . famotidine (PEPCID) 40 MG tablet    Sig: Take 0.5-1 tablets (20-40 mg total) by mouth at bedtime as needed for heartburn or indigestion.    Dispense:  90 tablet    Refill:  1     Penni Homans, MD

## 2018-08-12 DIAGNOSIS — F4323 Adjustment disorder with mixed anxiety and depressed mood: Secondary | ICD-10-CM | POA: Diagnosis not present

## 2018-09-09 DIAGNOSIS — F4323 Adjustment disorder with mixed anxiety and depressed mood: Secondary | ICD-10-CM | POA: Diagnosis not present

## 2018-09-28 ENCOUNTER — Institutional Professional Consult (permissible substitution): Payer: BLUE CROSS/BLUE SHIELD | Admitting: Pulmonary Disease

## 2018-09-30 DIAGNOSIS — F4323 Adjustment disorder with mixed anxiety and depressed mood: Secondary | ICD-10-CM | POA: Diagnosis not present

## 2018-10-19 ENCOUNTER — Encounter: Payer: BLUE CROSS/BLUE SHIELD | Admitting: Family Medicine

## 2018-10-21 DIAGNOSIS — F4323 Adjustment disorder with mixed anxiety and depressed mood: Secondary | ICD-10-CM | POA: Diagnosis not present

## 2018-11-25 DIAGNOSIS — F4323 Adjustment disorder with mixed anxiety and depressed mood: Secondary | ICD-10-CM | POA: Diagnosis not present

## 2018-12-11 DIAGNOSIS — L920 Granuloma annulare: Secondary | ICD-10-CM | POA: Diagnosis not present

## 2018-12-11 DIAGNOSIS — L821 Other seborrheic keratosis: Secondary | ICD-10-CM | POA: Diagnosis not present

## 2018-12-16 DIAGNOSIS — F4323 Adjustment disorder with mixed anxiety and depressed mood: Secondary | ICD-10-CM | POA: Diagnosis not present

## 2019-01-13 DIAGNOSIS — F4323 Adjustment disorder with mixed anxiety and depressed mood: Secondary | ICD-10-CM | POA: Diagnosis not present

## 2019-01-16 ENCOUNTER — Other Ambulatory Visit (HOSPITAL_BASED_OUTPATIENT_CLINIC_OR_DEPARTMENT_OTHER): Payer: Self-pay | Admitting: Family Medicine

## 2019-01-16 DIAGNOSIS — Z1231 Encounter for screening mammogram for malignant neoplasm of breast: Secondary | ICD-10-CM

## 2019-01-17 ENCOUNTER — Other Ambulatory Visit: Payer: Self-pay

## 2019-01-17 ENCOUNTER — Ambulatory Visit (HOSPITAL_BASED_OUTPATIENT_CLINIC_OR_DEPARTMENT_OTHER)
Admission: RE | Admit: 2019-01-17 | Discharge: 2019-01-17 | Disposition: A | Discharge status: Home / Self Care | Payer: BC Managed Care – PPO | Source: Ambulatory Visit | Attending: Family Medicine | Admitting: Family Medicine

## 2019-01-17 DIAGNOSIS — Z1231 Encounter for screening mammogram for malignant neoplasm of breast: Secondary | ICD-10-CM | POA: Insufficient documentation

## 2019-01-25 ENCOUNTER — Ambulatory Visit (INDEPENDENT_AMBULATORY_CARE_PROVIDER_SITE_OTHER): Payer: BC Managed Care – PPO | Admitting: Family Medicine

## 2019-01-25 ENCOUNTER — Encounter: Payer: Self-pay | Admitting: Family Medicine

## 2019-01-25 ENCOUNTER — Other Ambulatory Visit: Payer: Self-pay

## 2019-01-25 DIAGNOSIS — M79672 Pain in left foot: Secondary | ICD-10-CM

## 2019-01-25 DIAGNOSIS — R739 Hyperglycemia, unspecified: Secondary | ICD-10-CM | POA: Diagnosis not present

## 2019-01-25 DIAGNOSIS — I10 Essential (primary) hypertension: Secondary | ICD-10-CM | POA: Diagnosis not present

## 2019-01-25 DIAGNOSIS — M79671 Pain in right foot: Secondary | ICD-10-CM

## 2019-01-25 DIAGNOSIS — K219 Gastro-esophageal reflux disease without esophagitis: Secondary | ICD-10-CM | POA: Diagnosis not present

## 2019-01-25 DIAGNOSIS — Z0000 Encounter for general adult medical examination without abnormal findings: Secondary | ICD-10-CM

## 2019-01-25 MED ORDER — FAMOTIDINE 40 MG PO TABS: 20.0000 mg | ORAL_TABLET | Freq: Every evening | ORAL | 2 refills | Status: DC | PRN

## 2019-01-25 MED ORDER — FLUOXETINE HCL 20 MG PO TABS: 20.0000 mg | ORAL_TABLET | Freq: Every day | ORAL | 1 refills | Status: DC

## 2019-01-25 MED ORDER — ATENOLOL 50 MG PO TABS: ORAL_TABLET | ORAL | 2 refills | Status: DC

## 2019-01-28 NOTE — Assessment & Plan Note (Signed)
Still troubling her. L>R, encouraged icing, stretching and adequate foot wear.

## 2019-01-28 NOTE — Assessment & Plan Note (Addendum)
Encouraged to check vitals weekly no changes to meds. Encouraged heart healthy diet such as the DASH diet and exercise as tolerated.  

## 2019-01-28 NOTE — Assessment & Plan Note (Signed)
Avoid offending foods, start probiotics. Do not eat large meals in late evening and consider raising head of bed. Famotidine 40 mg qhs prn

## 2019-01-28 NOTE — Assessment & Plan Note (Signed)
hgba1c acceptable, minimize simple carbs. Increase exercise as tolerated.  

## 2019-01-28 NOTE — Progress Notes (Signed)
Virtual Visit via Video Note  I connected with [name] on 01/28/19 at  3:20 PM EDT by a video enabled telemedicine application and verified that I am speaking with the correct person using two identifiers.  Location: Patient: home Provider: office   I discussed the limitations of evaluation and management by telemedicine and the availability of in person appointments. The patient expressed understanding and agreed to proceed. Kim Fox, CMA was able to get patient set up on visit, video   Subjective:    Patient ID: Kim Fox, female    DOB: [birth year], 53 y.o.   MRN: [id number]  No chief complaint on file.   HPI Patient is in today for annual preventative exam and follow-up on chronic medical concerns including hypertension, reflux, migraines and more.  She feels well today.  No recent febrile illness or hospitalizations.  She has been working at home since [employer] began.  She denies any recent acute concerns but does acknowledge has had stress with kids home.  She continues to struggle with bilateral foot pain left worse than right as well as some right shoulder and neck pain.  No falls or trauma.  No radicular symptoms.  She is trying to maintain a heart healthy diet. Denies CP/palp/SOB/HA/congestion/fevers/GI or GU c/o. Taking meds as prescribed  Past Medical History:  Diagnosis Date  . Dyslipidemia 10/17/2017  . GERD (gastroesophageal reflux disease)   . Headache(784.0)    migraine  . History of chicken pox 04/10/2015  . Hypertension   . I 04/04/2014  . Menorrhagia   . Multiple renal cysts 08/09/2011  . Murmur, cardiac    Echo WNL 2009  . Obesity   . Polycystic kidney disease   . Preventative health care 04/20/2015  . Situational anxiety 10/17/2017  . Urinary incontinence 10/28/2015    Past Surgical History:  Procedure Laterality Date  . ENDOMETRIAL BIOPSY  2010  . KIDNEY CYST REMOVAL  2002  . TONSILLECTOMY    . URETERAL STENT PLACEMENT  2002     Family History  Problem Relation Age of Onset  . Diabetes Mother        insulin dependent  . Hypertension Mother   . Migraines Mother   . Varicose Veins Mother   . Arthritis Mother   . Diabetes Father   . Hypertension Father   . Kidney disease Father   . Arthritis Father   . Migraines Sister   . Allergies Sister   . Renal Cyst Sister   . Cancer Sister        skin cancer  . Dysfunctional uterine bleeding Sister   . Cancer Paternal Grandmother        lung  . Cancer Paternal Grandfather        bladder, prostate  . Heart disease Paternal Grandfather   . Hyperlipidemia Daughter   . Fibromyalgia Sister   . Renal Cyst Sister   . Hypertension Sister   . Dysfunctional uterine bleeding Sister     Social History   Socioeconomic History  . Marital status: Divorced    Spouse name: Not on file  . Number of children: 2  . Years of education: college  . Highest education level: Not on file  Occupational History  . Occupation: Aeronautical engineer: [employer]  Social Needs  . Financial resource strain: Not on file  . Food insecurity    Worry: Not on file    Inability: Not on file  . Transportation needs  Medical: Not on file    Non-medical: Not on file  Tobacco Use  . Smoking status: Never Smoker  . Smokeless tobacco: Never Used  . Tobacco comment: never used tobacco  Substance and Sexual Activity  . Alcohol use: Yes    Alcohol/week: 1.0 standard drinks    Types: 1 Standard drinks or equivalent per week  . Drug use: No  . Sexual activity: Not Currently    Comment: lives with daughter and occasionally son, works in [place], no major dietary  Lifestyle  . Physical activity    Days per week: Not on file    Minutes per session: Not on file  . Stress: Not on file  Relationships  . Social Herbalist on phone: Not on file    Gets together: Not on file    Attends religious service: Not on file    Active member of club or organization: Not  on file    Attends meetings of clubs or organizations: Not on file    Relationship status: Not on file  . Intimate partner violence    Fear of current or ex partner: Not on file    Emotionally abused: Not on file    Physically abused: Not on file    Forced sexual activity: Not on file  Other Topics Concern  . Not on file  Social History Narrative  . Not on file    Outpatient Medications Prior to Visit  Medication Sig Dispense Refill  . aspirin EC 81 MG tablet Take 81 mg by mouth 2 (two) times daily.    . Biotin 5000 MCG CAPS Take 1 capsule by mouth daily.    . Calcium Carbonate-Vitamin D (CALCIUM-VITAMIN D) 600-200 MG-UNIT CAPS Take 1 tablet by mouth 2 (two) times daily.    . folic acid (FOLVITE) 983 MCG tablet Take 400 mcg by mouth daily.    Marland Kitchen L-Lysine 1000 MG TABS Take 1 tablet by mouth daily.    . Multiple Vitamin (MULTIVITAMIN) tablet Take 1 tablet by mouth daily.    . multivitamin-lutein (OCUVITE-LUTEIN) CAPS capsule Take 1 capsule by mouth daily.    . Probiotic Product (ACIDOPHILUS/GOAT MILK) CAPS Take by mouth.    . SUMAtriptan (IMITREX) 100 MG tablet Take at the onset of HA.  May repeat in 2 hours if needed.  Max dose 2/24 hours, 4/week 10 tablet 3  . atenolol (TENORMIN) 50 MG tablet TAKE 1 TABLET(50 MG) BY MOUTH DAILY 90 tablet 2  . famotidine (PEPCID) 40 MG tablet Take 0.5-1 tablets (20-40 mg total) by mouth at bedtime as needed for heartburn or indigestion. 90 tablet 1  . FLUoxetine (PROZAC) 20 MG tablet Take 1 tablet (20 mg total) by mouth daily. 90 tablet 1   No facility-administered medications prior to visit.     Allergies  Allergen Reactions  . Nitrofurantoin Rash  . Macrodantin Rash  . Penicillins Rash    Review of Systems  Constitutional: Negative for fever and malaise/fatigue.  HENT: Negative for congestion.   Eyes: Negative for blurred vision.  Respiratory: Negative for shortness of breath.   Cardiovascular: Negative for chest pain, palpitations and leg  swelling.  Gastrointestinal: Negative for abdominal pain, blood in stool and nausea.  Genitourinary: Negative for dysuria and frequency.  Musculoskeletal: Positive for joint pain. Negative for falls.  Skin: Negative for rash.  Neurological: Negative for dizziness, loss of consciousness and headaches.  Endo/Heme/Allergies: Negative for environmental allergies.  Psychiatric/Behavioral: Negative for depression. The patient is not  nervous/anxious.        Objective:    Physical Exam Constitutional:      General: She is not in acute distress.    Appearance: She is well-developed.  HENT:     Head: Normocephalic and atraumatic.  Eyes:     Conjunctiva/sclera: Conjunctivae normal.  Neck:     Thyroid: No thyromegaly.  Cardiovascular:     Rate and Rhythm: Normal rate and regular rhythm.  Pulmonary:     Effort: Pulmonary effort is normal.  Abdominal:     General: Bowel sounds are normal. There is no distension.     Palpations: Abdomen is soft. There is no mass.     Tenderness: There is no abdominal tenderness.  Skin:    General: Skin is dry.  Neurological:     Mental Status: She is alert and oriented to person, place, and time.  Psychiatric:        Behavior: Behavior normal.     Wt 270 lb (122.5 kg)   BMI 42.29 kg/m  Wt Readings from Last 3 Encounters:  01/25/19 270 lb (122.5 kg)  07/27/18 268 lb 6.4 oz (121.7 kg)  04/20/18 271 lb 12.8 oz (123.3 kg)    Diabetic Foot Exam - Simple   No data filed     Lab Results  Component Value Date   WBC 9.4 07/27/2018   HGB 15.6 (H) 07/27/2018   HCT 46.8 (H) 07/27/2018   PLT 244.0 07/27/2018   GLUCOSE 111 (H) 07/27/2018   CHOL 181 07/27/2018   TRIG 248.0 (H) 07/27/2018   HDL 38.90 (L) 07/27/2018   LDLDIRECT 117.0 07/27/2018   LDLCALC 106 (H) 04/20/2018   ALT 17 07/27/2018   AST 18 07/27/2018   NA 142 07/27/2018   K 4.3 07/27/2018   CL 105 07/27/2018   CREATININE 0.98 07/27/2018   BUN 23 07/27/2018   CO2 28 07/27/2018    TSH 1.08 07/27/2018   INR 1.03 03/25/2014   HGBA1C 5.8 07/27/2018   MICROALBUR 1.0 10/17/2017    Lab Results  Component Value Date   TSH 1.08 07/27/2018   Lab Results  Component Value Date   WBC 9.4 07/27/2018   HGB 15.6 (H) 07/27/2018   HCT 46.8 (H) 07/27/2018   MCV 88.4 07/27/2018   PLT 244.0 07/27/2018   Lab Results  Component Value Date   NA 142 07/27/2018   K 4.3 07/27/2018   CHLORIDE 108 10/07/2016   CO2 28 07/27/2018   GLUCOSE 111 (H) 07/27/2018   BUN 23 07/27/2018   CREATININE 0.98 07/27/2018   BILITOT 0.3 07/27/2018   ALKPHOS 84 07/27/2018   AST 18 07/27/2018   ALT 17 07/27/2018   PROT 7.0 07/27/2018   ALBUMIN 4.0 07/27/2018   CALCIUM 9.3 07/27/2018   ANIONGAP 8 10/07/2016   EGFR 80 (L) 10/07/2016   GFR 59.78 (L) 07/27/2018   Lab Results  Component Value Date   CHOL 181 07/27/2018   Lab Results  Component Value Date   HDL 38.90 (L) 07/27/2018   Lab Results  Component Value Date   LDLCALC 106 (H) 04/20/2018   Lab Results  Component Value Date   TRIG 248.0 (H) 07/27/2018   Lab Results  Component Value Date   CHOLHDL 5 07/27/2018   Lab Results  Component Value Date   HGBA1C 5.8 07/27/2018       Assessment & Plan:   Problem List Items Addressed This Visit    Essential hypertension, benign    Encouraged to  check vitals weekly no changes to meds. Encouraged heart healthy diet such as the DASH diet and exercise as tolerated.       Relevant Medications   atenolol (TENORMIN) 50 MG tablet   GERD (gastroesophageal reflux disease)    Avoid offending foods, start probiotics. Do not eat large meals in late evening and consider raising head of bed. Famotidine 40 mg qhs prn      Relevant Medications   famotidine (PEPCID) 40 MG tablet   Preventative health care    Patient encouraged to maintain heart healthy diet, regular exercise, adequate sleep. Consider daily probiotics. Take medications as prescribed. Labs ordered and reviewed.        Hyperglycemia    hgba1c acceptable, minimize simple carbs. Increase exercise as tolerated.       Foot pain, bilateral    Still troubling her. L>R, encouraged icing, stretching and adequate foot wear.          I am having [name] "[name]" maintain her L-Lysine, Biotin, multivitamin, Calcium-Vitamin D, folic acid, SUMAtriptan, Acidophilus/Goat Milk, aspirin EC, multivitamin-lutein, atenolol, famotidine, and FLUoxetine.  Meds ordered this encounter  Medications  . atenolol (TENORMIN) 50 MG tablet    Sig: TAKE 1 TABLET(50 MG) BY MOUTH DAILY    Dispense:  90 tablet    Refill:  2  . famotidine (PEPCID) 40 MG tablet    Sig: Take 0.5-1 tablets (20-40 mg total) by mouth at bedtime as needed for heartburn or indigestion.    Dispense:  90 tablet    Refill:  2  . FLUoxetine (PROZAC) 20 MG tablet    Sig: Take 1 tablet (20 mg total) by mouth daily.    Dispense:  90 tablet    Refill:  1     I discussed the assessment and treatment plan with the patient. The patient was provided an opportunity to ask questions and all were answered. The patient agreed with the plan and demonstrated an understanding of the instructions.   The patient was advised to call back or seek an in-person evaluation if the symptoms worsen or if the condition fails to improve as anticipated.  I provided 25 minutes of non-face-to-face time during this encounter.   Penni Homans, MD

## 2019-01-28 NOTE — Assessment & Plan Note (Signed)
Patient encouraged to maintain heart healthy diet, regular exercise, adequate sleep. Consider daily probiotics. Take medications as prescribed. Labs ordered and reviewed 

## 2019-02-10 DIAGNOSIS — F4323 Adjustment disorder with mixed anxiety and depressed mood: Secondary | ICD-10-CM | POA: Diagnosis not present

## 2019-03-03 DIAGNOSIS — F4323 Adjustment disorder with mixed anxiety and depressed mood: Secondary | ICD-10-CM | POA: Diagnosis not present

## 2019-03-20 ENCOUNTER — Encounter (HOSPITAL_BASED_OUTPATIENT_CLINIC_OR_DEPARTMENT_OTHER): Payer: Self-pay

## 2019-03-20 ENCOUNTER — Emergency Department (HOSPITAL_BASED_OUTPATIENT_CLINIC_OR_DEPARTMENT_OTHER): Payer: BC Managed Care – PPO

## 2019-03-20 ENCOUNTER — Other Ambulatory Visit: Payer: Self-pay

## 2019-03-20 DIAGNOSIS — S8992XA Unspecified injury of left lower leg, initial encounter: Secondary | ICD-10-CM | POA: Insufficient documentation

## 2019-03-20 DIAGNOSIS — Y999 Unspecified external cause status: Secondary | ICD-10-CM | POA: Diagnosis not present

## 2019-03-20 DIAGNOSIS — Z7982 Long term (current) use of aspirin: Secondary | ICD-10-CM | POA: Diagnosis not present

## 2019-03-20 DIAGNOSIS — Y93H2 Activity, gardening and landscaping: Secondary | ICD-10-CM | POA: Diagnosis not present

## 2019-03-20 DIAGNOSIS — M25462 Effusion, left knee: Secondary | ICD-10-CM | POA: Insufficient documentation

## 2019-03-20 DIAGNOSIS — X58XXXA Exposure to other specified factors, initial encounter: Secondary | ICD-10-CM | POA: Diagnosis not present

## 2019-03-20 DIAGNOSIS — Z79899 Other long term (current) drug therapy: Secondary | ICD-10-CM | POA: Insufficient documentation

## 2019-03-20 DIAGNOSIS — I10 Essential (primary) hypertension: Secondary | ICD-10-CM | POA: Diagnosis not present

## 2019-03-20 DIAGNOSIS — Y929 Unspecified place or not applicable: Secondary | ICD-10-CM | POA: Insufficient documentation

## 2019-03-20 NOTE — ED Triage Notes (Signed)
Pt felt a pop in left knee ~1pm today-NAD-to triage in w/c

## 2019-03-21 ENCOUNTER — Emergency Department (HOSPITAL_BASED_OUTPATIENT_CLINIC_OR_DEPARTMENT_OTHER)
Admission: EM | Admit: 2019-03-21 | Discharge: 2019-03-21 | Disposition: A | Discharge status: Home / Self Care | Payer: BC Managed Care – PPO | Attending: Emergency Medicine | Admitting: Emergency Medicine

## 2019-03-21 DIAGNOSIS — S8992XA Unspecified injury of left lower leg, initial encounter: Secondary | ICD-10-CM

## 2019-03-21 DIAGNOSIS — M25462 Effusion, left knee: Secondary | ICD-10-CM

## 2019-03-21 MED ORDER — HYDROCODONE-ACETAMINOPHEN 5-325 MG PO TABS: 1.0000 | ORAL_TABLET | Freq: Four times a day (QID) | ORAL | 0 refills | Status: DC | PRN

## 2019-03-21 MED ORDER — HYDROCODONE-ACETAMINOPHEN 5-325 MG PO TABS
1.0000 | ORAL_TABLET | Freq: Once | ORAL | Status: AC
  Administered 2019-03-21: 02:00:00 1 via ORAL
  Filled 2019-03-21: qty 1

## 2019-03-21 NOTE — ED Notes (Signed)
ED Provider at bedside. 

## 2019-03-21 NOTE — ED Provider Notes (Signed)
[facility] Provider Note   CSN: [id number] Arrival date & time: 03/20/19  2233     History   Chief Complaint Chief Complaint  Patient presents with  . Knee Injury    HPI Kim Fox is a 53 y.o. female.     The history is provided by the patient.  Knee Pain Location:  Knee Time since incident:  12 hours Injury: yes   Knee location:  L knee Pain details:    Quality:  Aching   Radiates to:  Does not radiate   Severity:  Moderate   Onset quality:  Sudden   Timing:  Constant   Progression:  Worsening Chronicity:  New Relieved by:  Rest Worsened by:  Bearing weight Associated symptoms: decreased ROM   Associated symptoms: no back pain and no fever    Patient reports she was landscaping today when she heard a pop in left knee  and fell to the ground.  She had immediate pain and swelling in her left knee.  No previous surgeries to left knee.  No other significant injuries reported.  No head injury/LOC  Past Medical History:  Diagnosis Date  . Dyslipidemia 10/17/2017  . GERD (gastroesophageal reflux disease)   . Headache(784.0)    migraine  . History of chicken pox 04/10/2015  . Hypertension   . I 04/04/2014  . Menorrhagia   . Multiple renal cysts 08/09/2011  . Murmur, cardiac    Echo WNL 2009  . Obesity   . Polycystic kidney disease   . Preventative health care 04/20/2015  . Situational anxiety 10/17/2017  . Urinary incontinence 10/28/2015    Patient Active Problem List   Diagnosis Date Noted  . Polycythemia 07/30/2018  . Skin lesion 07/30/2018  . Snoring 07/30/2018  . Foot pain, bilateral 07/30/2018  . Hyperglycemia 07/27/2018  . Perimenopausal 04/20/2018  . Depression with anxiety 04/20/2018  . Dyslipidemia 10/17/2017  . Situational anxiety 10/17/2017  . Cervical cancer screening 10/14/2016  . Urinary incontinence 10/28/2015  . Absence of bladder continence 10/14/2015  . Preventative health care 04/20/2015  .  History of chicken pox 04/10/2015  . Right leg DVT ([place]) 05/24/2014  . DUB (dysfunctional uterine bleeding) 10/11/2013  . Essential hypertension, benign 08/09/2011  . GERD (gastroesophageal reflux disease) 08/09/2011  . Migraine 08/09/2011  . Multiple renal cysts 08/09/2011  . Obesity 08/09/2011  . H/O cardiac murmur 08/09/2011  . Abnormal mammogram 08/09/2011    Past Surgical History:  Procedure Laterality Date  . ENDOMETRIAL BIOPSY  2010  . KIDNEY CYST REMOVAL  2002  . TONSILLECTOMY    . URETERAL STENT PLACEMENT  2002     OB History    Gravida  2   Para  2   Term      Preterm      AB      Living        SAB      TAB      Ectopic      Multiple      Live Births               Home Medications    Prior to Admission medications   Medication Sig Start Date End Date Taking? Authorizing Provider  aspirin EC 81 MG tablet Take 81 mg by mouth 2 (two) times daily.    [provider]  atenolol (TENORMIN) 50 MG tablet TAKE 1 TABLET(50 MG) BY MOUTH DAILY 01/25/19   [name], MD  Biotin 5000 MCG CAPS Take 1 capsule by mouth daily.    [provider]  Calcium Carbonate-Vitamin D (CALCIUM-VITAMIN D) 600-200 MG-UNIT CAPS Take 1 tablet by mouth 2 (two) times daily.    [provider]  famotidine (PEPCID) 40 MG tablet Take 0.5-1 tablets (20-40 mg total) by mouth at bedtime as needed for heartburn or indigestion. 01/25/19   [name], MD  FLUoxetine (PROZAC) 20 MG tablet Take 1 tablet (20 mg total) by mouth daily. 01/25/19   [name], MD  folic acid (FOLVITE) Q000111Q MCG tablet Take 400 mcg by mouth daily.    [provider]  HYDROcodone-acetaminophen (NORCO/VICODIN) 5-325 MG tablet Take 1 tablet by mouth every 6 (six) hours as needed for severe pain. 03/21/19   Ripley Fraise, MD  L-Lysine 1000 MG TABS Take 1 tablet by mouth daily.    [provider]  Multiple Vitamin (MULTIVITAMIN) tablet Take 1 tablet by mouth daily.     [provider]  multivitamin-lutein (OCUVITE-LUTEIN) CAPS capsule Take 1 capsule by mouth daily.    [provider]  Probiotic Product (ACIDOPHILUS/GOAT MILK) CAPS Take by mouth.    [provider]  SUMAtriptan (IMITREX) 100 MG tablet Take at the onset of HA.  May repeat in 2 hours if needed.  Max dose 2/24 hours, 4/week 08/29/14   Schoenhoff, Altamese Cabal, MD    Family History Family History  Problem Relation Age of Onset  . Diabetes Mother        insulin dependent  . Hypertension Mother   . Migraines Mother   . Varicose Veins Mother   . Arthritis Mother   . Diabetes Father   . Hypertension Father   . Kidney disease Father   . Arthritis Father   . Migraines Sister   . Allergies Sister   . Renal Cyst Sister   . Cancer Sister        skin cancer  . Dysfunctional uterine bleeding Sister   . Cancer Paternal Grandmother        lung  . Cancer Paternal Grandfather        bladder, prostate  . Heart disease Paternal Grandfather   . Hyperlipidemia Daughter   . Fibromyalgia Sister   . Renal Cyst Sister   . Hypertension Sister   . Dysfunctional uterine bleeding Sister     Social History Social History   Tobacco Use  . Smoking status: Never Smoker  . Smokeless tobacco: Never Used  Substance Use Topics  . Alcohol use: Yes    Comment: occ  . Drug use: No     Allergies   Nitrofurantoin, Macrodantin, and Penicillins   Review of Systems Review of Systems  Constitutional: Negative for fever.  Musculoskeletal: Positive for arthralgias and joint swelling. Negative for back pain.  Neurological: Negative for headaches.  All other systems reviewed and are negative.    Physical Exam Updated Vital Signs BP (!) 159/86 (BP Location: Right Arm)   Pulse 82   Temp 98.1 F (36.7 C) (Oral)   Resp 18   Ht 1.702 m (5\' 7" )   Wt 122.5 kg   SpO2 96%   BMI 42.29 kg/m   Physical Exam CONSTITUTIONAL: Well developed/well nourished HEAD:  Normocephalic/atraumatic EYES: EOMI ENMT: Mask in place NECK: supple no meningeal signs SPINE/BACK:entire spine nontender CV: S1/S2 noted, no murmurs/rubs/gallops noted LUNGS: Lungs are clear to auscultation bilaterally, no apparent distress ABDOMEN: soft, nontender  NEURO: Pt is awake/alert/appropriate, moves all extremitiesx4.  No facial  droop.   EXTREMITIES: pulses normal/equal, full ROM Distal pulses intact.  No left calf tenderness.  Large effusion noted to left knee which limits ROM. No erythema.  No tenderness with ROM of left hip.  No deformities.  All other extremities/joints palpated/ranged and nontender SKIN: warm, color normal PSYCH: no abnormalities of mood noted, alert and oriented to situation   ED Treatments / Results  Labs (all labs ordered are listed, but only abnormal results are displayed) Labs Reviewed - No data to display  EKG None  Radiology Dg Knee Complete 4 Views Left  Result Date: 03/20/2019 CLINICAL DATA:  Knee pain, felt a pop EXAM: LEFT KNEE - COMPLETE 4+ VIEW COMPARISON:  Radiograph Oct 30, 2008 FINDINGS: Moderate left knee joint effusion with stranding in Hoffa's fat pad. Mild tricompartmental degenerative changes are noted. No acute fracture or traumatic malalignment is evident. IMPRESSION: Swelling and moderate left knee joint effusion without acute osseous abnormality. Mild tricompartmental degenerative changes, similar to comparison from 2010 Electronically Signed   By: Lovena Le M.D.   On: 03/20/2019 23:09    Procedures Procedures    Medications Ordered in ED Medications  HYDROcodone-acetaminophen (NORCO/VICODIN) 5-325 MG per tablet 1 tablet (1 tablet Oral Given 03/21/19 0218)     Initial Impression / Assessment and Plan / ED Course  I have reviewed the triage vital signs and the nursing notes.  Pertinent  imaging results that were available during my care of the patient were reviewed by me and considered in my medical decision making  (see chart for details).        Patient heard a pop and fell to ground today had immediate pain to left knee.  Strong suspicion for ACL tear Will place in crutches, knee immobilizer and close Ortho follow-up.  Advised ice to left knee.  She cannot tolerate NSAIDs, will give Vicodin. Advised no weightbearing until seen by orthopedic  Final Clinical Impressions(s) / ED Diagnoses   Final diagnoses:  Effusion of left knee  Injury of left knee, initial encounter    ED Discharge Orders         Ordered    HYDROcodone-acetaminophen (NORCO/VICODIN) 5-325 MG tablet  Every 6 hours PRN     03/21/19 0219           Ripley Fraise, MD 03/21/19 [phone]

## 2019-03-22 DIAGNOSIS — M25562 Pain in left knee: Secondary | ICD-10-CM | POA: Diagnosis not present

## 2019-03-24 DIAGNOSIS — F4323 Adjustment disorder with mixed anxiety and depressed mood: Secondary | ICD-10-CM | POA: Diagnosis not present

## 2019-03-28 DIAGNOSIS — M25562 Pain in left knee: Secondary | ICD-10-CM | POA: Diagnosis not present

## 2019-04-04 DIAGNOSIS — S83512D Sprain of anterior cruciate ligament of left knee, subsequent encounter: Secondary | ICD-10-CM | POA: Diagnosis not present

## 2019-04-04 DIAGNOSIS — M25562 Pain in left knee: Secondary | ICD-10-CM | POA: Diagnosis not present

## 2019-04-12 ENCOUNTER — Ambulatory Visit: Payer: BC Managed Care – PPO | Attending: Orthopedic Surgery

## 2019-04-12 ENCOUNTER — Other Ambulatory Visit: Payer: Self-pay

## 2019-04-12 DIAGNOSIS — M25662 Stiffness of left knee, not elsewhere classified: Secondary | ICD-10-CM | POA: Insufficient documentation

## 2019-04-12 DIAGNOSIS — M25562 Pain in left knee: Secondary | ICD-10-CM | POA: Diagnosis not present

## 2019-04-12 NOTE — Patient Instructions (Signed)
Access Code: [email or web address]  URL: [email or web address]  Date: 04/12/2019  Prepared by: Tomma Rakers   Exercises Long Sitting Calf Stretch with Strap - 3 reps - 1 sets - 20 seconds hold - 2-3x daily - 7x weekly Sitting Heel Slide with Towel - 10 reps - 1 sets - 5 seconds hold - 2-3x daily - 7x weekly Sidelying Hip Abduction - 10 reps - 2 sets - 2-3x daily - 7x weekly

## 2019-04-12 NOTE — Therapy (Signed)
[facility] [place], [place], [address] Phone: [phone]   Fax:  [phone]  Physical Therapy Evaluation  Patient Details  Name: Kim Fox MRN: [id number] Date of Birth: 08-03-1965 Referring Provider (PT): Victorino December MD   Encounter Date: 04/12/2019  PT End of Session - 04/12/19 0855    Visit Number  1    Number of Visits  9    Date for PT Re-Evaluation  05/17/19    PT Start Time  0800    PT Stop Time  0845    PT Time Calculation (min)  45 min    Activity Tolerance  Patient tolerated treatment well    Behavior During Therapy  [facility] for tasks assessed/performed       Past Medical History:  Diagnosis Date  . Dyslipidemia 10/17/2017  . GERD (gastroesophageal reflux disease)   . Headache(784.0)    migraine  . History of chicken pox 04/10/2015  . Hypertension   . I 04/04/2014  . Menorrhagia   . Multiple renal cysts 08/09/2011  . Murmur, cardiac    Echo WNL 2009  . Obesity   . Polycystic kidney disease   . Preventative health care 04/20/2015  . Situational anxiety 10/17/2017  . Urinary incontinence 10/28/2015    Past Surgical History:  Procedure Laterality Date  . ENDOMETRIAL BIOPSY  2010  . KIDNEY CYST REMOVAL  2002  . TONSILLECTOMY    . URETERAL STENT PLACEMENT  2002    There were no vitals filed for this visit.   Subjective Assessment - 04/12/19 0806    Subjective  Pt reports that her knee has felt unstable and popped for about 10 years. She states that about 3 weeks ago she was doing some landscaping and had her L leg behind her and then her R leg twisted and then her L leg gave out and popped. She states that her knee was extremely swollen so she drove herself to the ER. She was told that she has a torn ACL. Patient brought imaging report with her. She has been wearing a compression sleeve over her knee which seems to help alot with pain and stability. She states that at the  grocery store her knee feels better when she leans on the cart. Pt reports that she was initially experiencing difficulty with sleeping but that has improved.    Pertinent History  HTN controlled    Limitations  Standing    How long can you sit comfortably?  No limitations    How long can you stand comfortably?  10-15 minutes    How long can you walk comfortably?  5-6 minutes    Diagnostic tests  MRI with findings of Mild comminuted fracture of theposterior lateral tibial plateau, high grade tear of ACL, probable hoizontal tear posterior horn medial meniscus.    Patient Stated Goals  I want to be able to move my knee the way it use to and not have pain.    Currently in Pain?  Yes    Pain Score  3    8/10 at the worst   Pain Location  Knee    Pain Orientation  Right    Pain Descriptors / Indicators  Tightness;Tender    Pain Type  Acute pain    Pain Radiating Towards  down the posterior lower leg and sometimes up the posterior thigh.    Pain Onset  1 to 4 weeks ago    Pain Frequency  Intermittent    Aggravating Factors   walking/standing    Pain Relieving Factors  Rest, ice, compression, elevation    Effect of Pain on Daily Activities  I'm not doing what I was doing before becuase of the pain in my knee.    Multiple Pain Sites  No         [facility] PT Assessment - 04/12/19 0001      Assessment   Medical Diagnosis  L ACL repair    Referring Provider (PT)  Victorino December MD    Onset Date/Surgical Date  03/20/19    Hand Dominance  Right    Next MD Visit  05/21/19    Prior Therapy  No      Restrictions   Weight Bearing Restrictions  No      Balance Screen   Has the patient fallen in the past 6 months  No    Has the patient had a decrease in activity level because of a fear of falling?   Yes    Is the patient reluctant to leave their home because of a fear of falling?   No      Home Environment   Living Environment  Private residence    Living Arrangements  Children    Type of [place] to enter    Entrance Stairs-Number of Steps  1    [place]  One level      Prior Function   Level of Independence  Independent    Vocation  Full time employment    Vocation Requirements  Sitting a lot on the computer. Pt is an HR Scientist, physiological    Leisure  walk my dog, Biomedical scientist, go to [employer], shopping      Cognition   Overall Cognitive Status  Within Functional Limits for tasks assessed      Observation/Other Assessments   Observations  Swellng on the lateral aspect of  the lower leg    Skin Integrity  Skin warm, dry and intact       ROM / Strength   AROM / PROM / Strength  AROM;Strength      AROM   AROM Assessment Site  Knee    Right/Left Knee  Right;Left    Right Knee Extension  5    Right Knee Flexion  120    Left Knee Extension  0    Left Knee Flexion  93      Strength   Strength Assessment Site  Hip;Knee;Ankle    Right/Left Hip  Right;Left    Right Hip Flexion  4/5    Right Hip ABduction  4-/5    Right Hip ADduction  4+/5    Left Hip Flexion  3+/5    Left Hip ABduction  4-/5    Left Hip ADduction  4+/5    Right/Left Knee  Right;Left    Right Knee Flexion  5/5    Right Knee Extension  5/5    Left Knee Flexion  4+/5    Left Knee Extension  4/5    Right/Left Ankle  Right;Left    Right Ankle Dorsiflexion  5/5    Right Ankle Plantar Flexion  4+/5    Left Ankle Dorsiflexion  4/5    Left Ankle Plantar Flexion  4/5      Functional Gait  Assessment   Gait assessed   Yes  Objective measurements completed on examination: See above findings.      [facility] PT Treatment/Exercise - 04/12/19 0001      Ambulation/Gait   Gait Comments  Pt has antalgic gait with decreased WB on the L compared to the R and poor foot clearance on the L.       Exercises   Exercises  Knee/Hip      Knee/Hip Exercises: Stretches   Gastroc Stretch  2 reps;Left;20 seconds    Gastroc Stretch Limitations  VC to avoid pain,  demonstration for movement, discussed avoiding pain and getting a very light stretch. No increase in pain      Knee/Hip Exercises: Seated   Heel Slides  1 set;Left;AAROM;10 reps    Heel Slides Limitations  VC to avoid pain just get light stretching or tightness.       Knee/Hip Exercises: Supine   Straight Leg Raises  Left;1 set;10 reps;AROM    Straight Leg Raises Limitations  VC for dorsiflexion and to not go higher than R knee in order to not put stress on the low back. Discontinued for now due to pain at insertion of quad 4/10 from 0/10    Straight Leg Raise with External Rotation  AROM    Straight Leg Raise with External Rotation Limitations  Significant increase in pain in medial knee performed 2x       Knee/Hip Exercises: Sidelying   Hip ABduction  AROM;Strengthening;1 set;Left;10 reps    Hip ABduction Limitations  10x tactile cueing to decrease compensation w/posterior pelvic rotation. Slight increase in pain 2/10 from 0/10 will continue on HEP              PT Education - 04/12/19 0850    Education Details  Access Code: [email or web address], Pt was educated on the kinematics of the LE once the foot hits the floor. Discussed the effects of the ankle/knee and hip on each other in WB. Pt was educated on the anatomy and physiology of the knee joint including fluid exchange in the knee and how nutrition/lubrication is brought into the knee joint. Pt was educated on the muscle pump activities required to facilitate fluid flow out of the knee. Re-iterated education on RICE and to continue with this at home especially during work due to edema in the L lateral lower leg a    Person(s) Educated  Patient    Methods  Explanation;Demonstration;Tactile cues;Verbal cues;Handout    Comprehension  Verbalized understanding;Returned demonstration       PT Short Term Goals - 04/12/19 0859      PT SHORT TERM GOAL #1   Title  Pt will be independent with her HEP    Baseline  Pt does not have an HEP    Time   1    Period  Weeks    Status  New    Target Date  04/26/19        PT Long Term Goals - 04/12/19 0900      PT LONG TERM GOAL #1   Title  Pt will demonstrate 120 degrees of L knee flexion in order to demonstrate functional ROM    Baseline  93 degrees L knee flexion    Time  4    Period  Weeks    Status  New    Target Date  05/17/19      PT LONG TERM GOAL #2   Title  Pt will demonstrate 5/5 strength in the L knee/ankle and Bil hips in order to  demonstrate improved functional strength for decreased risk of injury related to ligament deficits.    Baseline  Bil hip abd 4-/5, Bil hip add 4/5, L hip flexion 3+/5, R hip flexion 4/5, L knee flexion 4+/5, L knee ext 4/5, L ankle DF 4/5    Time  4    Period  Weeks    Status  New    Target Date  05/17/19      PT LONG TERM GOAL #3   Title  Pt will reportpain 5/10 or less at the worst and 1/10 or less with ambulation activities in order to demonstrate an improved quality of life.    Baseline  8/10 at the worst, 3/10 with ambulating into clinic.    Time  4    Period  Weeks    Status  New    Target Date  05/17/19             Plan - 04/12/19 0855    Clinical Impression Statement  Pt presents to physical therapy status post L ACL tear, medial meniscal tear and tibial plateau fracture. She is not planning on surgery and her edema has decreased significanty since initial injury. Pt presents with antalgic gait with decreased stance time on the LLE and poor foot clearance due to decreased ROM at the L knee. Pt demonstrated Bil hip weakness, L knee/ankle weakness and decreased ROM at the L knee compared to the R. She has slight hyperextensibility noted in the R knee that is not present in the L knee with extension most likely due to her injury. Pt will benefit from skilled physical therapy services in order to address the above limitations.    Personal Factors and Comorbidities  Comorbidity 2    Comorbidities  HTN, obesity     Examination-Activity Limitations  Locomotion Level;Squat;Stand;Stairs;Toileting    Stability/Clinical Decision Making  Stable/Uncomplicated    Clinical Decision Making  Low    Rehab Potential  Excellent    PT Frequency  2x / week    PT Duration  4 weeks    PT Treatment/Interventions  ADLs/Self Care Home Management;Moist Heat;Cryotherapy;Electrical Stimulation;Ultrasound;Manual techniques;Neuromuscular re-education;Patient/family education;Passive range of motion;Therapeutic exercise;Balance training;Therapeutic activities;Functional mobility training;Stair training;Gait training;Taping;Vasopneumatic Device    PT Next Visit Plan  Assess HEP, Joint mobilizatoins for edema, Monitor edema in the L lower leg, easily improve quad contraction possibly starting w/quad set    PT Home Exercise Plan  Access Code: [email or web address]    Consulted and Agree with Plan of Care  Patient       Patient will benefit from skilled therapeutic intervention in order to improve the following deficits and impairments:  Abnormal gait, Decreased range of motion, Pain, Decreased strength  Visit Diagnosis: Acute pain of left knee  Stiffness of left knee, not elsewhere classified     Problem List Patient Active Problem List   Diagnosis Date Noted  . Polycythemia 07/30/2018  . Skin lesion 07/30/2018  . Snoring 07/30/2018  . Foot pain, bilateral 07/30/2018  . Hyperglycemia 07/27/2018  . Perimenopausal 04/20/2018  . Depression with anxiety 04/20/2018  . Dyslipidemia 10/17/2017  . Situational anxiety 10/17/2017  . Cervical cancer screening 10/14/2016  . Urinary incontinence 10/28/2015  . Absence of bladder continence 10/14/2015  . Preventative health care 04/20/2015  . History of chicken pox 04/10/2015  . Right leg DVT ([place]) 05/24/2014  . DUB (dysfunctional uterine bleeding) 10/11/2013  . Essential hypertension, benign 08/09/2011  . GERD (gastroesophageal reflux disease) 08/09/2011  . Migraine 08/09/2011  .  Multiple  renal cysts 08/09/2011  . Obesity 08/09/2011  . H/O cardiac murmur 08/09/2011  . Abnormal mammogram 08/09/2011    Ander Purpura, PT 04/12/2019, 9:05 AM  [facility] [address] [place], [place], [address] Phone: [phone]   Fax:  [phone]  Name: Kim Fox MRN: [id number] Date of Birth: 01/21/1966

## 2019-04-14 DIAGNOSIS — F4323 Adjustment disorder with mixed anxiety and depressed mood: Secondary | ICD-10-CM | POA: Diagnosis not present

## 2019-04-17 ENCOUNTER — Other Ambulatory Visit: Payer: Self-pay

## 2019-04-17 ENCOUNTER — Ambulatory Visit: Payer: BC Managed Care – PPO | Admitting: Physical Therapy

## 2019-04-17 DIAGNOSIS — M25562 Pain in left knee: Secondary | ICD-10-CM | POA: Diagnosis not present

## 2019-04-17 DIAGNOSIS — M25662 Stiffness of left knee, not elsewhere classified: Secondary | ICD-10-CM | POA: Diagnosis not present

## 2019-04-17 NOTE — Therapy (Signed)
[place], [place], [address] Phone: [phone]   Fax:  [phone]  Physical Therapy Treatment  Patient Details  Name: Kim Fox MRN: [id number] Date of Birth: 11-19-1965 Referring Provider (PT): Victorino December MD   Encounter Date: 04/17/2019  PT End of Session - 04/17/19 0842    PT Start Time  0800    PT Stop Time  0839    PT Time Calculation (min)  39 min       Past Medical History:  Diagnosis Date  . Dyslipidemia 10/17/2017  . GERD (gastroesophageal reflux disease)   . Headache(784.0)    migraine  . History of chicken pox 04/10/2015  . Hypertension   . I 04/04/2014  . Menorrhagia   . Multiple renal cysts 08/09/2011  . Murmur, cardiac    Echo WNL 2009  . Obesity   . Polycystic kidney disease   . Preventative health care 04/20/2015  . Situational anxiety 10/17/2017  . Urinary incontinence 10/28/2015    Past Surgical History:  Procedure Laterality Date  . ENDOMETRIAL BIOPSY  2010  . KIDNEY CYST REMOVAL  2002  . TONSILLECTOMY    . URETERAL STENT PLACEMENT  2002    There were no vitals filed for this visit.  Subjective Assessment - 04/17/19 0800    Subjective  " doing pretty good" "pain at certain points"    Currently in Pain?  Yes    Pain Score  2                        [facility] Adult PT Treatment/Exercise - 04/17/19 0001      Knee/Hip Exercises: Aerobic   Recumbent Bike  L1 x 3 min    full revolutions   Nustep  L2 x 5 min      Knee/Hip Exercises: Seated   Long Arc Quad  20 reps;AROM;Left    Hamstring Curl  AROM;Strengthening;Left;20 reps   red t band   Abduction/Adduction   Strengthening;Both;20 reps   abduction w/ red tband, adduction w/ ball      Knee/Hip Exercises: Supine   Heel Slides  AROM;Left;20 reps   with overpressure   Bridges  Strengthening;Both;20 reps    Straight Leg Raises  Left;AROM;20 reps      Knee/Hip Exercises: Sidelying    Hip ABduction  AROM;Strengthening;1 set;Left;10 reps               PT Short Term Goals - 04/12/19 0859      PT SHORT TERM GOAL #1   Title  Pt will be independent with her HEP    Baseline  Pt does not have an HEP    Time  1    Period  Weeks    Status  New    Target Date  04/26/19        PT Long Term Goals - 04/12/19 0900      PT LONG TERM GOAL #1   Title  Pt will demonstrate 120 degrees of L knee flexion in order to demonstrate functional ROM    Baseline  93 degrees L knee flexion    Time  4    Period  Weeks    Status  New    Target Date  05/17/19      PT LONG TERM GOAL #2   Title  Pt will demonstrate 5/5 strength in the L knee/ankle and Bil hips in order to demonstrate improved  functional strength for decreased risk of injury related to ligament deficits.    Baseline  Bil hip abd 4-/5, Bil hip add 4/5, L hip flexion 3+/5, R hip flexion 4/5, L knee flexion 4+/5, L knee ext 4/5, L ankle DF 4/5    Time  4    Period  Weeks    Status  New    Target Date  05/17/19      PT LONG TERM GOAL #3   Title  Pt will reportpain 5/10 or less at the worst and 1/10 or less with ambulation activities in order to demonstrate an improved quality of life.    Baseline  8/10 at the worst, 3/10 with ambulating into clinic.    Time  4    Period  Weeks    Status  New    Target Date  05/17/19            Plan - 04/17/19 0919    Clinical Impression Statement  Pt able to complete full revolutions of the bike. Pt needs cues with hip adduction to not use hands for compensation. Pt guarded with PROM of extension. Pt needs cues to relax with PROM.    Personal Factors and Comorbidities  Comorbidity 2    Examination-Activity Limitations  Locomotion Level;Squat;Stand;Stairs;Toileting    Rehab Potential  Excellent    PT Frequency  2x / week    PT Duration  4 weeks    PT Treatment/Interventions  ADLs/Self Care Home Management;Moist Heat;Cryotherapy;Electrical Stimulation;Ultrasound;Manual  techniques;Neuromuscular re-education;Patient/family education;Passive range of motion;Therapeutic exercise;Balance training;Therapeutic activities;Functional mobility training;Stair training;Gait training;Taping;Vasopneumatic Device    PT Next Visit Plan  Assess HEP, Joint mobilizatoins for edema, Monitor edema in the L lower leg, easily improve quad contraction possibly starting w/quad set       Patient will benefit from skilled therapeutic intervention in order to improve the following deficits and impairments:  Abnormal gait, Decreased range of motion, Pain, Decreased strength  Visit Diagnosis: Stiffness of left knee, not elsewhere classified     Problem List Patient Active Problem List   Diagnosis Date Noted  . Polycythemia 07/30/2018  . Skin lesion 07/30/2018  . Snoring 07/30/2018  . Foot pain, bilateral 07/30/2018  . Hyperglycemia 07/27/2018  . Perimenopausal 04/20/2018  . Depression with anxiety 04/20/2018  . Dyslipidemia 10/17/2017  . Situational anxiety 10/17/2017  . Cervical cancer screening 10/14/2016  . Urinary incontinence 10/28/2015  . Absence of bladder continence 10/14/2015  . Preventative health care 04/20/2015  . History of chicken pox 04/10/2015  . Right leg DVT ([place]) 05/24/2014  . DUB (dysfunctional uterine bleeding) 10/11/2013  . Essential hypertension, benign 08/09/2011  . GERD (gastroesophageal reflux disease) 08/09/2011  . Migraine 08/09/2011  . Multiple renal cysts 08/09/2011  . Obesity 08/09/2011  . H/O cardiac murmur 08/09/2011  . Abnormal mammogram 08/09/2011    Barrett Henle, [place] 04/17/2019, 9:31 AM  [place], [place], [address] Phone: [phone]   Fax:  [phone]  Name: Kim Fox MRN: [id number] Date of Birth: March 09, 1966

## 2019-04-19 ENCOUNTER — Ambulatory Visit: Payer: BC Managed Care – PPO

## 2019-04-19 ENCOUNTER — Other Ambulatory Visit: Payer: Self-pay

## 2019-04-19 DIAGNOSIS — M25562 Pain in left knee: Secondary | ICD-10-CM | POA: Diagnosis not present

## 2019-04-19 DIAGNOSIS — M25662 Stiffness of left knee, not elsewhere classified: Secondary | ICD-10-CM

## 2019-04-19 NOTE — Therapy (Signed)
[place], [place], [address] Phone: [phone]   Fax:  [phone]  Physical Therapy Treatment  Patient Details  Name: Kim Fox MRN: [id number] Date of Birth: 1965/12/09 Referring Provider (PT): Victorino December MD   Encounter Date: 04/19/2019  PT End of Session - 04/19/19 0831    Visit Number  3    Number of Visits  9    Date for PT Re-Evaluation  05/17/19    PT Start Time  0803    PT Stop Time  0843    PT Time Calculation (min)  40 min    Activity Tolerance  Patient tolerated treatment well    Behavior During Therapy  [facility] for tasks assessed/performed       Past Medical History:  Diagnosis Date  . Dyslipidemia 10/17/2017  . GERD (gastroesophageal reflux disease)   . Headache(784.0)    migraine  . History of chicken pox 04/10/2015  . Hypertension   . I 04/04/2014  . Menorrhagia   . Multiple renal cysts 08/09/2011  . Murmur, cardiac    Echo WNL 2009  . Obesity   . Polycystic kidney disease   . Preventative health care 04/20/2015  . Situational anxiety 10/17/2017  . Urinary incontinence 10/28/2015    Past Surgical History:  Procedure Laterality Date  . ENDOMETRIAL BIOPSY  2010  . KIDNEY CYST REMOVAL  2002  . TONSILLECTOMY    . URETERAL STENT PLACEMENT  2002    There were no vitals filed for this visit.  Subjective Assessment - 04/19/19 0807    Subjective  Pt reports that she had swelling after her last session and had pain on both sides of her knee cap.    Pertinent History  HTN controlled    Limitations  Standing    How long can you sit comfortably?  No limitations    How long can you stand comfortably?  10-15 minutes    How long can you walk comfortably?  5-6 minutes    Diagnostic tests  MRI with findings of Mild comminuted fracture of theposterior lateral tibial plateau, high grade tear of ACL, probable hoizontal tear posterior horn medial meniscus.    Patient Stated  Goals  I want to be able to move my knee the way it use to and not have pain.    Currently in Pain?  Yes    Pain Score  3     Pain Location  Knee    Pain Orientation  Right    Pain Descriptors / Indicators  Tightness;Tender    Pain Type  Acute pain    Pain Onset  1 to 4 weeks ago    Pain Frequency  Intermittent    Aggravating Factors   walking/standing    Pain Relieving Factors  rest, ice, compression and elevation    Effect of Pain on Daily Activities  I'm not doing what I was doing before becuase of the pain in my knee.    Multiple Pain Sites  No                       [facility] Adult PT Treatment/Exercise - 04/19/19 0001      Knee/Hip Exercises: Aerobic   Recumbent Bike  L1 x 3 min    full revolutions w/o a significant increase in pain.      Knee/Hip Exercises: Standing   Other Standing Knee Exercises  standing hamstring curl 2x 10,  tetany noted between 90-120 degrees on the L due to weakness. Exercise performed only on the L    Other Standing Knee Exercises  wgt shifting from R/L with "soft knees" to practice decreasing reliance on the ligaments by locking out the Bil knees w/VC and demonstration for the correct movement 2x 20 alt      Knee/Hip Exercises: Seated   Long Arc Quad  20 reps;AROM;Left    Long Arc Quad Limitations  tetany noticable between 0 and 20 degreesdue to quadriceps weakness on the L       Knee/Hip Exercises: Supine   Heel Slides  AROM;Left;10 reps;2 sets   with overpressure   Heel Slides Limitations  VC to go up to the pain but avoid pain then holding stretch to promote fluid exchange, VC to keep heel on the surface to relax quad and get greater range.              PT Education - 04/19/19 [id number]    Education Details  Pt will continue with current HEP at this time. Discussed performing wgt shifting in standing without locking the knees Bil    Person(s) Educated  Patient    Methods  Explanation    Comprehension  Verbalized understanding        PT Short Term Goals - 04/12/19 0859      PT SHORT TERM GOAL #1   Title  Pt will be independent with her HEP    Baseline  Pt does not have an HEP    Time  1    Period  Weeks    Status  New    Target Date  04/26/19        PT Long Term Goals - 04/12/19 0900      PT LONG TERM GOAL #1   Title  Pt will demonstrate 120 degrees of L knee flexion in order to demonstrate functional ROM    Baseline  93 degrees L knee flexion    Time  4    Period  Weeks    Status  New    Target Date  05/17/19      PT LONG TERM GOAL #2   Title  Pt will demonstrate 5/5 strength in the L knee/ankle and Bil hips in order to demonstrate improved functional strength for decreased risk of injury related to ligament deficits.    Baseline  Bil hip abd 4-/5, Bil hip add 4/5, L hip flexion 3+/5, R hip flexion 4/5, L knee flexion 4+/5, L knee ext 4/5, L ankle DF 4/5    Time  4    Period  Weeks    Status  New    Target Date  05/17/19      PT LONG TERM GOAL #3   Title  Pt will reportpain 5/10 or less at the worst and 1/10 or less with ambulation activities in order to demonstrate an improved quality of life.    Baseline  8/10 at the worst, 3/10 with ambulating into clinic.    Time  4    Period  Weeks    Status  New    Target Date  05/17/19            Plan - 04/19/19 0808    Clinical Impression Statement  Noted tetany in the quad and hamstring in gravity resisted positions, pain-free, demonstrating significant weakness in shortened positions of the muscle groups. Pt was able to tolerate increase WB activities this session w/o pain with VC to  prevent locking the knee joint and relying on the ligaments for stability to increase muscle strength/stablity Bil due to pt has hyperextension of Bil knees. Palpable tightness/tenderness noted at the L quad especially on the lateral portion at the patella; significant decrease in pain/tightness following STM. Pt will benefit from continued POC.    Personal Factors and  Comorbidities  Comorbidity 2    Comorbidities  HTN, obesity    Examination-Activity Limitations  Locomotion Level;Squat;Stand;Stairs;Toileting    Stability/Clinical Decision Making  Stable/Uncomplicated    Rehab Potential  Excellent    PT Frequency  2x / week    PT Duration  4 weeks    PT Treatment/Interventions  ADLs/Self Care Home Management;Moist Heat;Cryotherapy;Electrical Stimulation;Ultrasound;Manual techniques;Neuromuscular re-education;Patient/family education;Passive range of motion;Therapeutic exercise;Balance training;Therapeutic activities;Functional mobility training;Stair training;Gait training;Taping;Vasopneumatic Device    PT Next Visit Plan  Assess HEP and update with easy quad activation/hamstring activation activities, Monitor edema in the L lower leg pt has history of DVT, Continue to easily progress the L hamstring/quad.    PT Home Exercise Plan  Access Code: [email or web address]    Consulted and Agree with Plan of Care  Patient       Patient will benefit from skilled therapeutic intervention in order to improve the following deficits and impairments:  Abnormal gait, Decreased range of motion, Pain, Decreased strength  Visit Diagnosis: Stiffness of left knee, not elsewhere classified  Acute pain of left knee     Problem List Patient Active Problem List   Diagnosis Date Noted  . Polycythemia 07/30/2018  . Skin lesion 07/30/2018  . Snoring 07/30/2018  . Foot pain, bilateral 07/30/2018  . Hyperglycemia 07/27/2018  . Perimenopausal 04/20/2018  . Depression with anxiety 04/20/2018  . Dyslipidemia 10/17/2017  . Situational anxiety 10/17/2017  . Cervical cancer screening 10/14/2016  . Urinary incontinence 10/28/2015  . Absence of bladder continence 10/14/2015  . Preventative health care 04/20/2015  . History of chicken pox 04/10/2015  . Right leg DVT ([place]) 05/24/2014  . DUB (dysfunctional uterine bleeding) 10/11/2013  . Essential hypertension, benign 08/09/2011  . GERD  (gastroesophageal reflux disease) 08/09/2011  . Migraine 08/09/2011  . Multiple renal cysts 08/09/2011  . Obesity 08/09/2011  . H/O cardiac murmur 08/09/2011  . Abnormal mammogram 08/09/2011    Ander Purpura, PT 04/19/2019, 9:23 AM  [place] [address] [place], [place], [address] Phone: [phone]   Fax:  [phone]  Name: Neya Nyholm MRN: [id number] Date of Birth: 05-02-1966

## 2019-04-20 ENCOUNTER — Ambulatory Visit (INDEPENDENT_AMBULATORY_CARE_PROVIDER_SITE_OTHER): Payer: BC Managed Care – PPO

## 2019-04-20 ENCOUNTER — Other Ambulatory Visit: Payer: Self-pay

## 2019-04-20 DIAGNOSIS — Z23 Encounter for immunization: Secondary | ICD-10-CM | POA: Diagnosis not present

## 2019-04-20 DIAGNOSIS — Z131 Encounter for screening for diabetes mellitus: Secondary | ICD-10-CM

## 2019-04-20 DIAGNOSIS — Z0000 Encounter for general adult medical examination without abnormal findings: Secondary | ICD-10-CM

## 2019-04-20 DIAGNOSIS — E559 Vitamin D deficiency, unspecified: Secondary | ICD-10-CM

## 2019-04-23 ENCOUNTER — Encounter: Payer: Self-pay | Admitting: Physical Therapy

## 2019-04-23 ENCOUNTER — Other Ambulatory Visit: Payer: Self-pay

## 2019-04-23 ENCOUNTER — Ambulatory Visit: Payer: BC Managed Care – PPO | Attending: Orthopedic Surgery | Admitting: Physical Therapy

## 2019-04-23 DIAGNOSIS — M25662 Stiffness of left knee, not elsewhere classified: Secondary | ICD-10-CM | POA: Diagnosis not present

## 2019-04-23 DIAGNOSIS — M25562 Pain in left knee: Secondary | ICD-10-CM

## 2019-04-23 NOTE — Therapy (Signed)
[place], [place], [address] Phone: [phone]   Fax:  [phone]  Physical Therapy Treatment  Patient Details  Name: Kim Fox MRN: [id number] Date of Birth: 18-Dec-1965 Referring Provider (PT): Victorino December MD   Encounter Date: 04/23/2019  PT End of Session - 04/23/19 0924    Visit Number  4    Date for PT Re-Evaluation  05/17/19    PT Start Time  0843    PT Stop Time  0925    PT Time Calculation (min)  42 min    Activity Tolerance  Patient tolerated treatment well    Behavior During Therapy  [facility] for tasks assessed/performed       Past Medical History:  Diagnosis Date  . Dyslipidemia 10/17/2017  . GERD (gastroesophageal reflux disease)   . Headache(784.0)    migraine  . History of chicken pox 04/10/2015  . Hypertension   . I 04/04/2014  . Menorrhagia   . Multiple renal cysts 08/09/2011  . Murmur, cardiac    Echo WNL 2009  . Obesity   . Polycystic kidney disease   . Preventative health care 04/20/2015  . Situational anxiety 10/17/2017  . Urinary incontinence 10/28/2015    Past Surgical History:  Procedure Laterality Date  . ENDOMETRIAL BIOPSY  2010  . KIDNEY CYST REMOVAL  2002  . TONSILLECTOMY    . URETERAL STENT PLACEMENT  2002    There were no vitals filed for this visit.  Subjective Assessment - 04/23/19 0847    Subjective  Patient reports that she continued to have some soreness but rested yesterday and is feeling better today , less swelling and less pain    Currently in Pain?  Yes    Pain Score  3     Pain Location  Knee    Pain Orientation  Right    Aggravating Factors   swelling                       [facility] Adult PT Treatment/Exercise - 04/23/19 0001      High Level Balance   High Level Balance Comments  standing on airex ball toss, head turns , eyes closed all for proprioception      Knee/Hip Exercises: Aerobic   Recumbent Bike  Level  0 x 6 minutes full revs      Knee/Hip Exercises: Standing   Knee Flexion  Left;2 sets;10 reps    Knee Flexion Limitations  3#    Terminal Knee Extension  Left;2 sets;10 reps    Terminal Knee Extension Limitations  ball behind the knee    Hip Abduction  Left;2 sets;10 reps    Abduction Limitations  3#    Walking with Sports Cord  all directions      Knee/Hip Exercises: Supine   Short Arc Quad Sets  Left;2 sets;10 reps    Short Arc Quad Sets Limitations  3#    Bridges with Ball Squeeze  2 sets;10 reps    Straight Leg Raise with External Rotation  Left;2 sets;10 reps    Other Supine Knee/Hip Exercises  feet on ball bridgfe               PT Short Term Goals - 04/23/19 5183      PT SHORT TERM GOAL #1   Title  Pt will be independent with her HEP    Status  Partially Met  PT Long Term Goals - 04/23/19 0926      PT LONG TERM GOAL #1   Title  Pt will demonstrate 120 degrees of L knee flexion in order to demonstrate functional ROM    Status  On-going            Plan - 04/23/19 0924    Clinical Impression Statement  Patient did well witht he proprioception exercises, she has crepitus with TKE, I tried some manual medial pressure onthe patella and this decreaesd it but did not eliminate the crepitus.  No pain during treatment today    PT Next Visit Plan  continue to work on strength function and proprioception    Consulted and Agree with Plan of Care  Patient       Patient will benefit from skilled therapeutic intervention in order to improve the following deficits and impairments:  Abnormal gait, Decreased range of motion, Pain, Decreased strength  Visit Diagnosis: Stiffness of left knee, not elsewhere classified  Acute pain of left knee     Problem List Patient Active Problem List   Diagnosis Date Noted  . Polycythemia 07/30/2018  . Skin lesion 07/30/2018  . Snoring 07/30/2018  . Foot pain, bilateral 07/30/2018  . Hyperglycemia 07/27/2018  .  Perimenopausal 04/20/2018  . Depression with anxiety 04/20/2018  . Dyslipidemia 10/17/2017  . Situational anxiety 10/17/2017  . Cervical cancer screening 10/14/2016  . Urinary incontinence 10/28/2015  . Absence of bladder continence 10/14/2015  . Preventative health care 04/20/2015  . History of chicken pox 04/10/2015  . Right leg DVT ([place]) 05/24/2014  . DUB (dysfunctional uterine bleeding) 10/11/2013  . Essential hypertension, benign 08/09/2011  . GERD (gastroesophageal reflux disease) 08/09/2011  . Migraine 08/09/2011  . Multiple renal cysts 08/09/2011  . Obesity 08/09/2011  . H/O cardiac murmur 08/09/2011  . Abnormal mammogram 08/09/2011    Sumner Boast., PT 04/23/2019, 9:27 AM  [place] [address] [facility] [place], [place], [address] Phone: [phone]   Fax:  [phone]  Name: Kim Fox MRN: [id number] Date of Birth: [birth year]

## 2019-04-24 ENCOUNTER — Encounter: Payer: BC Managed Care – PPO | Admitting: Physical Therapy

## 2019-04-24 ENCOUNTER — Other Ambulatory Visit (INDEPENDENT_AMBULATORY_CARE_PROVIDER_SITE_OTHER): Payer: BC Managed Care – PPO

## 2019-04-24 DIAGNOSIS — Z131 Encounter for screening for diabetes mellitus: Secondary | ICD-10-CM

## 2019-04-24 DIAGNOSIS — Z0000 Encounter for general adult medical examination without abnormal findings: Secondary | ICD-10-CM | POA: Diagnosis not present

## 2019-04-24 DIAGNOSIS — E559 Vitamin D deficiency, unspecified: Secondary | ICD-10-CM

## 2019-04-24 LAB — CBC WITH DIFFERENTIAL/PLATELET
Basophils Absolute: 0.1 10*3/uL (ref 0.0–0.1)
Basophils Relative: 1 % (ref 0.0–3.0)
Eosinophils Absolute: 0.2 10*3/uL (ref 0.0–0.7)
Eosinophils Relative: 1.9 % (ref 0.0–5.0)
HCT: 46.5 % — ABNORMAL HIGH (ref 36.0–46.0)
Hemoglobin: 15.3 g/dL — ABNORMAL HIGH (ref 12.0–15.0)
Lymphocytes Relative: 37.2 % (ref 12.0–46.0)
Lymphs Abs: 2.9 10*3/uL (ref 0.7–4.0)
MCHC: 33 g/dL (ref 30.0–36.0)
MCV: 89.8 fl (ref 78.0–100.0)
Monocytes Absolute: 0.7 10*3/uL (ref 0.1–1.0)
Monocytes Relative: 9.3 % (ref 3.0–12.0)
Neutro Abs: 4 10*3/uL (ref 1.4–7.7)
Neutrophils Relative %: 50.6 % (ref 43.0–77.0)
Platelets: 229 10*3/uL (ref 150.0–400.0)
RBC: 5.17 Mil/uL — ABNORMAL HIGH (ref 3.87–5.11)
RDW: 14 % (ref 11.5–15.5)
WBC: 7.9 10*3/uL (ref 4.0–10.5)

## 2019-04-24 LAB — COMPREHENSIVE METABOLIC PANEL
ALT: 20 U/L (ref 0–35)
AST: 26 U/L (ref 0–37)
Albumin: 3.7 g/dL (ref 3.5–5.2)
Alkaline Phosphatase: 81 U/L (ref 39–117)
BUN: 22 mg/dL (ref 6–23)
CO2: 27 mEq/L (ref 19–32)
Calcium: 8.7 mg/dL (ref 8.4–10.5)
Chloride: 104 mEq/L (ref 96–112)
Creatinine, Ser: 0.81 mg/dL (ref 0.40–1.20)
GFR: 74.27 mL/min (ref >60.00)
Glucose, Bld: 106 mg/dL — ABNORMAL HIGH (ref 70–99)
Potassium: 4.8 mEq/L (ref 3.5–5.1)
Sodium: 138 mEq/L (ref 135–145)
Total Bilirubin: 0.4 mg/dL (ref 0.2–1.2)
Total Protein: 6.9 g/dL (ref 6.0–8.3)

## 2019-04-24 LAB — TSH: TSH: 1.15 u[IU]/mL (ref 0.35–4.50)

## 2019-04-24 LAB — LIPID PANEL
Cholesterol: 182 mg/dL (ref 0–200)
HDL: 48.2 mg/dL (ref >39.00)
LDL Cholesterol: 103 mg/dL — ABNORMAL HIGH (ref 0–99)
NonHDL: 133.68
Total CHOL/HDL Ratio: 4
Triglycerides: 153 mg/dL — ABNORMAL HIGH (ref 0.0–149.0)
VLDL: 30.6 mg/dL (ref 0.0–40.0)

## 2019-04-24 LAB — HEMOGLOBIN A1C: Hgb A1c MFr Bld: 5.7 % (ref 4.6–6.5)

## 2019-04-24 LAB — VITAMIN D 25 HYDROXY (VIT D DEFICIENCY, FRACTURES): VITD: 31.22 ng/mL (ref 30.00–100.00)

## 2019-04-26 ENCOUNTER — Ambulatory Visit: Payer: BC Managed Care – PPO

## 2019-04-26 ENCOUNTER — Other Ambulatory Visit: Payer: Self-pay

## 2019-04-26 DIAGNOSIS — M25562 Pain in left knee: Secondary | ICD-10-CM | POA: Diagnosis not present

## 2019-04-26 DIAGNOSIS — M25662 Stiffness of left knee, not elsewhere classified: Secondary | ICD-10-CM | POA: Diagnosis not present

## 2019-04-26 NOTE — Therapy (Signed)
[place], [place], [address] Phone: [phone]   Fax:  [phone]  Physical Therapy Treatment  Patient Details  Name: Kim Fox MRN: [id number] Date of Birth: 10/04/1965 Referring Provider (PT): Victorino December MD   Encounter Date: 04/26/2019  PT End of Session - 04/26/19 0810    Visit Number  5    Number of Visits  9    Date for PT Re-Evaluation  05/17/19    PT Start Time  0800    PT Stop Time  0845    PT Time Calculation (min)  45 min    Activity Tolerance  Patient tolerated treatment well    Behavior During Therapy  [facility] for tasks assessed/performed       Past Medical History:  Diagnosis Date  . Dyslipidemia 10/17/2017  . GERD (gastroesophageal reflux disease)   . Headache(784.0)    migraine  . History of chicken pox 04/10/2015  . Hypertension   . I 04/04/2014  . Menorrhagia   . Multiple renal cysts 08/09/2011  . Murmur, cardiac    Echo WNL 2009  . Obesity   . Polycystic kidney disease   . Preventative health care 04/20/2015  . Situational anxiety 10/17/2017  . Urinary incontinence 10/28/2015    Past Surgical History:  Procedure Laterality Date  . ENDOMETRIAL BIOPSY  2010  . KIDNEY CYST REMOVAL  2002  . TONSILLECTOMY    . URETERAL STENT PLACEMENT  2002    There were no vitals filed for this visit.  Subjective Assessment - 04/26/19 0807    Subjective  Pt reports that she is a little sore after her physical therapy treatments but she elevates her leg and rests it which helps. She states that overall she has less swelling and is able to bend her knee more with less pain overall.    Pertinent History  HTN controlled    Limitations  Standing    How long can you sit comfortably?  No limitations    How long can you stand comfortably?  10-15 minutes    How long can you walk comfortably?  5-6 minutes    Diagnostic tests  MRI with findings of Mild comminuted fracture of  theposterior lateral tibial plateau, high grade tear of ACL, probable hoizontal tear posterior horn medial meniscus.    Currently in Pain?  Yes    Pain Score  2     Pain Location  Knee    Pain Orientation  Right    Pain Descriptors / Indicators  Tightness;Tender    Pain Type  Acute pain    Pain Radiating Towards  Down the posterior lower leg and sometimes up the posterior thigh    Pain Onset  1 to 4 weeks ago    Pain Frequency  Intermittent    Aggravating Factors   swelling    Pain Relieving Factors  rest, ice, compression and elevation    Effect of Pain on Daily Activities  I'm still not doing what I was before becuase of the pain in my knee.    Multiple Pain Sites  No                       [facility] Adult PT Treatment/Exercise - 04/26/19 0001      Knee/Hip Exercises: Aerobic   Recumbent Bike  Lvl 0 for 4 min and level 3 for 2 min w/o an increase in pain.  Knee/Hip Exercises: Standing   Knee Flexion  Left;2 sets;10 reps    Knee Flexion Limitations  3#, VC to prevent hip flexion and for slow eccentric contraction    Terminal Knee Extension  Left;2 sets;10 reps    Terminal Knee Extension Limitations  ball behind the knee full 10 second hold w/timer and VC to not lock the knee out with help placing foot.     Hip Abduction  Left;2 sets;10 reps    Abduction Limitations  3#, VC and tactile cueing for toe facing forward and tactile cueing at shoulder and hip to prevent trunk side bending compensation.     SLS  single leg stance on the LLE w/VC to prevent end range extension with VC "soft knees" 5 seconds 5x     SLS with Vectors  Hip 3 way 10x with LLE as stance leg tactile cueing at the L knee to prevent end range extension and VC to prevent twisting at the L knee in order to decrease stress on the knee.       Manual Therapy   Manual Therapy  Joint mobilization;Soft tissue mobilization    Joint Mobilization  patella mobilizations on the L all directions tightness noted  superiorly and laterally 8x8    Soft tissue mobilization  Palpable tightness/tenderness noted at the L quad, distal hamstring and gastroc; decreased minimally following moderate STM             PT Education - 04/26/19 0831    Education Details  Access Code: [email or web address] pt HEP was updated with WB activities that she will start this week.    Person(s) Educated  Patient    Methods  Explanation;Demonstration;Tactile cues;Verbal cues;Handout    Comprehension  Verbalized understanding       PT Short Term Goals - 04/23/19 0926      PT SHORT TERM GOAL #1   Title  Pt will be independent with her HEP    Status  Partially Met        PT Long Term Goals - 04/23/19 0926      PT LONG TERM GOAL #1   Title  Pt will demonstrate 120 degrees of L knee flexion in order to demonstrate functional ROM    Status  On-going            Plan - 04/26/19 0809    Clinical Impression Statement  Pt was able to tolerate an increase in WB exercises on the LLE this session w/o an increase in pain from baseline. Tactile cueing required to prevent terminal knee extension for stabilization in order to decrease reliance on L knee ligaments/tendons. Palpable tightness/tenderness noted at the distal quad, hamstrings and proximal gastroc; decreased minimally following moderate pressure STM. Pt HEP was updated this session with more WB activities. Pt will benefit from continued POC.    Comorbidities  HTN, obesity    Examination-Activity Limitations  Locomotion Level;Squat;Stand;Stairs;Toileting    PT Frequency  2x / week    PT Duration  4 weeks    PT Treatment/Interventions  ADLs/Self Care Home Management;Moist Heat;Cryotherapy;Electrical Stimulation;Ultrasound;Manual techniques;Neuromuscular re-education;Patient/family education;Passive range of motion;Therapeutic exercise;Balance training;Therapeutic activities;Functional mobility training;Stair training;Gait training;Taping;Vasopneumatic Device    PT Next Visit  Plan  continue to work on strength, function and proprioception    PT Home Exercise Plan  Access Code: [email or web address]    Consulted and Agree with Plan of Care  Patient       Patient will benefit from skilled therapeutic intervention in order to improve the following  deficits and impairments:  Abnormal gait, Decreased range of motion, Pain, Decreased strength  Visit Diagnosis: Stiffness of left knee, not elsewhere classified  Acute pain of left knee     Problem List Patient Active Problem List   Diagnosis Date Noted  . Polycythemia 07/30/2018  . Skin lesion 07/30/2018  . Snoring 07/30/2018  . Foot pain, bilateral 07/30/2018  . Hyperglycemia 07/27/2018  . Perimenopausal 04/20/2018  . Depression with anxiety 04/20/2018  . Dyslipidemia 10/17/2017  . Situational anxiety 10/17/2017  . Cervical cancer screening 10/14/2016  . Urinary incontinence 10/28/2015  . Absence of bladder continence 10/14/2015  . Preventative health care 04/20/2015  . History of chicken pox 04/10/2015  . Right leg DVT ([place]) 05/24/2014  . DUB (dysfunctional uterine bleeding) 10/11/2013  . Essential hypertension, benign 08/09/2011  . GERD (gastroesophageal reflux disease) 08/09/2011  . Migraine 08/09/2011  . Multiple renal cysts 08/09/2011  . Obesity 08/09/2011  . H/O cardiac murmur 08/09/2011  . Abnormal mammogram 08/09/2011    Ander Purpura, PT 04/26/2019, 9:10 AM  [address] [place], [place], [address] Phone: [phone]   Fax:  [phone]  Name: Kim Fox MRN: [id number] Date of Birth: Oct 27, 1965

## 2019-04-26 NOTE — Patient Instructions (Signed)
Access Code: [email or web address]  URL: [email or web address]  Date: 04/26/2019  Prepared by: Tomma Rakers   Exercises Long Sitting Calf Stretch with Strap - 3 reps - 1 sets - 20 seconds hold - 2-3x daily - 7x weekly Sitting Heel Slide with Towel - 10 reps - 1 sets - 5 seconds hold - 2-3x daily - 7x weekly Standing 4-Way Leg Reach with Counter Support - 10 reps - 2 sets - 1x daily - 7x weekly Single Leg Stance - 5 reps - 1 sets - 5 seconds hold - 1x daily - 7x weekly

## 2019-05-01 ENCOUNTER — Ambulatory Visit: Payer: BC Managed Care – PPO | Admitting: Physical Therapy

## 2019-05-01 ENCOUNTER — Other Ambulatory Visit: Payer: Self-pay

## 2019-05-01 DIAGNOSIS — M25662 Stiffness of left knee, not elsewhere classified: Secondary | ICD-10-CM | POA: Diagnosis not present

## 2019-05-01 DIAGNOSIS — M25562 Pain in left knee: Secondary | ICD-10-CM | POA: Diagnosis not present

## 2019-05-01 NOTE — Therapy (Signed)
[place], [place], [address] Phone: [phone]   Fax:  [phone]  Physical Therapy Treatment  Patient Details  Name: Kim Fox MRN: [id number] Date of Birth: 28-Apr-1966 Referring Provider (PT): Victorino December MD   Encounter Date: 05/01/2019  PT End of Session - 05/01/19 0844    Visit Number  6    PT Start Time  0800    PT Stop Time  9892    PT Time Calculation (min)  44 min       Past Medical History:  Diagnosis Date  . Dyslipidemia 10/17/2017  . GERD (gastroesophageal reflux disease)   . Headache(784.0)    migraine  . History of chicken pox 04/10/2015  . Hypertension   . I 04/04/2014  . Menorrhagia   . Multiple renal cysts 08/09/2011  . Murmur, cardiac    Echo WNL 2009  . Obesity   . Polycystic kidney disease   . Preventative health care 04/20/2015  . Situational anxiety 10/17/2017  . Urinary incontinence 10/28/2015    Past Surgical History:  Procedure Laterality Date  . ENDOMETRIAL BIOPSY  2010  . KIDNEY CYST REMOVAL  2002  . TONSILLECTOMY    . URETERAL STENT PLACEMENT  2002    There were no vitals filed for this visit.  Subjective Assessment - 05/01/19 0803    Subjective  pt reports no pain, but her foot is swollen    Currently in Pain?  No/denies                       [facility] Adult PT Treatment/Exercise - 05/01/19 0001      High Level Balance   High Level Balance Comments  SLS on airex w/ ball toss      Knee/Hip Exercises: Aerobic   Recumbent Bike  Lvl 0 for 5 min       Knee/Hip Exercises: Standing   Knee Flexion  Left;2 sets;10 reps    Terminal Knee Extension  Left;2 sets;10 reps    Terminal Knee Extension Limitations  ball behind the knee full 10 second hold w/timer and VC to not lock the knee out with help placing foot.     Hip Abduction  Left;2 sets;10 reps    SLS  single leg stance on the LLE     SLS with Vectors  Hip 3 way 10x with  LLE as stance leg tactile cueing at the L knee to prevent end range extension and VC to prevent twisting at the L knee in order to decrease stress on the knee.     Walking with Sports Cord  all directions 20# x3       Manual Therapy   Manual Therapy  Joint mobilization;Soft tissue mobilization    Joint Mobilization  patella mobilizations on the L all directions tightness noted superiorly and laterally 8x8    Soft tissue mobilization  Palpable tightness/tenderness in L quad               PT Short Term Goals - 04/23/19 0926      PT SHORT TERM GOAL #1   Title  Pt will be independent with her HEP    Status  Partially Met        PT Long Term Goals - 04/23/19 0926      PT LONG TERM GOAL #1   Title  Pt will demonstrate 120 degrees of L knee flexion in order to demonstrate  functional ROM    Status  On-going            Plan - 05/01/19 0845    Clinical Impression Statement  Pt tolerate dtreatment well as demonstrated by no increase of pain. Pt continues to work on increasing strength and proprioception. Pt able to pregress to SLS on airex with ball toss. pt needs cues to relax with joint mobs. pt needs tactile cues with standing exercises to improve posture. Pt educated on elevating foot to decrease swelling.    Personal Factors and Comorbidities  Comorbidity 2    Comorbidities  HTN, obesity    Examination-Activity Limitations  Locomotion Level;Squat;Stand;Stairs;Toileting    Stability/Clinical Decision Making  Stable/Uncomplicated    Rehab Potential  Excellent    PT Frequency  2x / week    PT Duration  4 weeks    PT Treatment/Interventions  ADLs/Self Care Home Management;Moist Heat;Cryotherapy;Electrical Stimulation;Ultrasound;Manual techniques;Neuromuscular re-education;Patient/family education;Passive range of motion;Therapeutic exercise;Balance training;Therapeutic activities;Functional mobility training;Stair training;Gait training;Taping;Vasopneumatic Device    PT Next  Visit Plan  continue to work on strength, function and proprioception       Patient will benefit from skilled therapeutic intervention in order to improve the following deficits and impairments:  Abnormal gait, Decreased range of motion, Pain, Decreased strength  Visit Diagnosis: Stiffness of left knee, not elsewhere classified     Problem List Patient Active Problem List   Diagnosis Date Noted  . Polycythemia 07/30/2018  . Skin lesion 07/30/2018  . Snoring 07/30/2018  . Foot pain, bilateral 07/30/2018  . Hyperglycemia 07/27/2018  . Perimenopausal 04/20/2018  . Depression with anxiety 04/20/2018  . Dyslipidemia 10/17/2017  . Situational anxiety 10/17/2017  . Cervical cancer screening 10/14/2016  . Urinary incontinence 10/28/2015  . Absence of bladder continence 10/14/2015  . Preventative health care 04/20/2015  . History of chicken pox 04/10/2015  . Right leg DVT ([place]) 05/24/2014  . DUB (dysfunctional uterine bleeding) 10/11/2013  . Essential hypertension, benign 08/09/2011  . GERD (gastroesophageal reflux disease) 08/09/2011  . Migraine 08/09/2011  . Multiple renal cysts 08/09/2011  . Obesity 08/09/2011  . H/O cardiac murmur 08/09/2011  . Abnormal mammogram 08/09/2011    Barrett Henle, [place] 05/01/2019, 8:49 AM  [place], [place], [address] Phone: [phone]   Fax:  [phone]  Name: Kim Fox MRN: [id number] Date of Birth: 07-16-1965

## 2019-05-03 ENCOUNTER — Ambulatory Visit: Payer: BC Managed Care – PPO | Admitting: Physical Therapy

## 2019-05-03 ENCOUNTER — Other Ambulatory Visit: Payer: Self-pay

## 2019-05-03 DIAGNOSIS — M25662 Stiffness of left knee, not elsewhere classified: Secondary | ICD-10-CM

## 2019-05-03 DIAGNOSIS — M25562 Pain in left knee: Secondary | ICD-10-CM | POA: Diagnosis not present

## 2019-05-03 NOTE — Therapy (Signed)
[place], [place], [address] Phone: [phone]   Fax:  [phone]  Physical Therapy Treatment  Patient Details  Name: Kim Fox MRN: [id number] Date of Birth: [birth year] Referring Provider (PT): Victorino December MD   Encounter Date: 05/03/2019  PT End of Session - 05/03/19 0839    Visit Number  7    PT Start Time  0800    PT Stop Time  0839    PT Time Calculation (min)  39 min       Past Medical History:  Diagnosis Date  . Dyslipidemia 10/17/2017  . GERD (gastroesophageal reflux disease)   . Headache(784.0)    migraine  . History of chicken pox 04/10/2015  . Hypertension   . I 04/04/2014  . Menorrhagia   . Multiple renal cysts 08/09/2011  . Murmur, cardiac    Echo WNL 2009  . Obesity   . Polycystic kidney disease   . Preventative health care 04/20/2015  . Situational anxiety 10/17/2017  . Urinary incontinence 10/28/2015    Past Surgical History:  Procedure Laterality Date  . ENDOMETRIAL BIOPSY  2010  . KIDNEY CYST REMOVAL  2002  . TONSILLECTOMY    . URETERAL STENT PLACEMENT  2002    There were no vitals filed for this visit.  Subjective Assessment - 05/03/19 0804    Subjective  pt reports no pain. she states that the swelling in her foot has decreased    Currently in Pain?  No/denies                       [facility] Adult PT Treatment/Exercise - 05/03/19 0001      High Level Balance   High Level Balance Comments  cone taps on airex, SLS ball toss w/ tramp       Knee/Hip Exercises: Aerobic   Recumbent Bike  Lvl 0 for 5 min       Knee/Hip Exercises: Standing   Forward Step Up  20 reps;Hand Hold: 1;Step Height: 4"   w/ airex   SLS with Vectors  Hip 3 way 10x with LLE as stance leg tactile cueing at the L knee to prevent end range extension and VC to prevent twisting at the L knee in order to decrease stress on the knee.    on airex   Other Standing Knee  Exercises  fitter flexion, extension, and abduction      Manual Therapy   Manual Therapy  Joint mobilization;Soft tissue mobilization    Joint Mobilization  patella mobilizations on the L all directions tightness noted superiorly and laterally 8x8    Soft tissue mobilization  Palpable tightness/tenderness in L quad               PT Short Term Goals - 05/03/19 0805      PT SHORT TERM GOAL #1   Title  Pt will be independent with her HEP    Status  Achieved        PT Long Term Goals - 05/03/19 0845      PT LONG TERM GOAL #1   Title  Pt will demonstrate 120 degrees of L knee flexion in order to demonstrate functional ROM    Status  Partially Met      PT LONG TERM GOAL #2   Title  Pt will demonstrate 5/5 strength in the L knee/ankle and Bil hips in order to demonstrate improved functional strength for  decreased risk of injury related to ligament deficits.    Status  Partially Met            Plan - 05/03/19 0845    Clinical Impression Statement  pt continues to work on increasing strength and proprioception. pt needs cues to relax with STM. she continues to have tenderness in the L quad. pt able to maintain balance with cone taps, SLS ball toss, and step up on airex with CGA. pt progressed to strengthening exercises with fitter.    Personal Factors and Comorbidities  Comorbidity 2    Comorbidities  HTN, obesity    Examination-Activity Limitations  Locomotion Level;Squat;Stand;Stairs;Toileting    Stability/Clinical Decision Making  Stable/Uncomplicated    Rehab Potential  Excellent    PT Frequency  2x / week    PT Duration  4 weeks    PT Treatment/Interventions  ADLs/Self Care Home Management;Moist Heat;Cryotherapy;Electrical Stimulation;Ultrasound;Manual techniques;Neuromuscular re-education;Patient/family education;Passive range of motion;Therapeutic exercise;Balance training;Therapeutic activities;Functional mobility training;Stair training;Gait  training;Taping;Vasopneumatic Device    PT Next Visit Plan  continue to work on strength, function and proprioception       Patient will benefit from skilled therapeutic intervention in order to improve the following deficits and impairments:  Abnormal gait, Decreased range of motion, Pain, Decreased strength  Visit Diagnosis: Stiffness of left knee, not elsewhere classified     Problem List Patient Active Problem List   Diagnosis Date Noted  . Polycythemia 07/30/2018  . Skin lesion 07/30/2018  . Snoring 07/30/2018  . Foot pain, bilateral 07/30/2018  . Hyperglycemia 07/27/2018  . Perimenopausal 04/20/2018  . Depression with anxiety 04/20/2018  . Dyslipidemia 10/17/2017  . Situational anxiety 10/17/2017  . Cervical cancer screening 10/14/2016  . Urinary incontinence 10/28/2015  . Absence of bladder continence 10/14/2015  . Preventative health care 04/20/2015  . History of chicken pox 04/10/2015  . Right leg DVT ([place]) 05/24/2014  . DUB (dysfunctional uterine bleeding) 10/11/2013  . Essential hypertension, benign 08/09/2011  . GERD (gastroesophageal reflux disease) 08/09/2011  . Migraine 08/09/2011  . Multiple renal cysts 08/09/2011  . Obesity 08/09/2011  . H/O cardiac murmur 08/09/2011  . Abnormal mammogram 08/09/2011    Barrett Henle, [place] 05/03/2019, 9:44 AM  [place], [place], [address] Phone: [phone]   Fax:  [phone]  Name: Kim Fox MRN: [id number] Date of Birth: 08/13/1965

## 2019-05-07 ENCOUNTER — Encounter: Payer: Self-pay | Admitting: Physical Therapy

## 2019-05-07 ENCOUNTER — Other Ambulatory Visit: Payer: Self-pay

## 2019-05-07 ENCOUNTER — Ambulatory Visit: Payer: BC Managed Care – PPO | Admitting: Physical Therapy

## 2019-05-07 DIAGNOSIS — M25562 Pain in left knee: Secondary | ICD-10-CM | POA: Diagnosis not present

## 2019-05-07 DIAGNOSIS — M25662 Stiffness of left knee, not elsewhere classified: Secondary | ICD-10-CM

## 2019-05-07 NOTE — Therapy (Signed)
[place], [place], [address] Phone: [phone]   Fax:  [phone]  Physical Therapy Treatment  Patient Details  Name: Kim Fox MRN: [id number] Date of Birth: January 24, 1966 Referring Provider (PT): Victorino December MD   Encounter Date: 05/07/2019  PT End of Session - 05/07/19 0923    Visit Number  8    Date for PT Re-Evaluation  05/17/19    PT Start Time  0841    PT Stop Time  0924    PT Time Calculation (min)  43 min    Activity Tolerance  Patient tolerated treatment well    Behavior During Therapy  [facility] for tasks assessed/performed       Past Medical History:  Diagnosis Date  . Dyslipidemia 10/17/2017  . GERD (gastroesophageal reflux disease)   . Headache(784.0)    migraine  . History of chicken pox 04/10/2015  . Hypertension   . I 04/04/2014  . Menorrhagia   . Multiple renal cysts 08/09/2011  . Murmur, cardiac    Echo WNL 2009  . Obesity   . Polycystic kidney disease   . Preventative health care 04/20/2015  . Situational anxiety 10/17/2017  . Urinary incontinence 10/28/2015    Past Surgical History:  Procedure Laterality Date  . ENDOMETRIAL BIOPSY  2010  . KIDNEY CYST REMOVAL  2002  . TONSILLECTOMY    . URETERAL STENT PLACEMENT  2002    There were no vitals filed for this visit.  Subjective Assessment - 05/07/19 0846    Subjective  Reports that she is doing well, reports that she tried to sit on the floor over the weekend and had a lot of difficulty getting up, had to have help getting up    Currently in Pain?  No/denies                       [facility] Adult PT Treatment/Exercise - 05/07/19 0001      High Level Balance   High Level Balance Activities  Tandem walking    High Level Balance Comments  ball toss on airex, cone taps on airex, tried some left leg SLS 10 seconds, ball kicks      Knee/Hip Exercises: Aerobic   Recumbent Bike  Lvl 1 for 5 min     Nustep  L4x 5 min      Knee/Hip Exercises: Machines for Strengthening   Cybex Knee Extension  5# only at TKE 2x10    Cybex Knee Flexion  25# 2x10    Cybex Leg Press  20# 2x10      Knee/Hip Exercises: Standing   Terminal Knee Extension Limitations  ball behind the knee full 10 second hold w/timer and VC to not lock the knee out with help placing foot.     Walking with Sports Cord  all directions in hall       Knee/Hip Exercises: Supine   Short Arc Quad Sets  Left;2 sets;10 reps    Short Arc Quad Sets Limitations  3, cues for TKE    Other Supine Knee/Hip Exercises  feet on ball bridge               PT Short Term Goals - 05/03/19 0805      PT SHORT TERM GOAL #1   Title  Pt will be independent with her HEP    Status  Achieved        PT Long Term Goals -  05/07/19 0926      PT LONG TERM GOAL #1   Title  Pt will demonstrate 120 degrees of L knee flexion in order to demonstrate functional ROM    Status  Partially Met      PT LONG TERM GOAL #2   Title  Pt will demonstrate 5/5 strength in the L knee/ankle and Bil hips in order to demonstrate improved functional strength for decreased risk of injury related to ligament deficits.    Status  Partially Met      PT LONG TERM GOAL #3   Title  Pt will reportpain 5/10 or less at the worst and 1/10 or less with ambulation activities in order to demonstrate an improved quality of life.    Status  Achieved            Plan - 05/07/19 0924    Clinical Impression Statement  Patient overlal doing well, c/o fatigue and mm soreness iwth the TKE on the machine, had some instability with SLS especially on the airex, otherwise sonme cues needed for TKE and doing well    PT Next Visit Plan  she will need a good HEP as she will see [place] one more time and then see the MD on the 30th    Consulted and Agree with Plan of Care  Patient       Patient will benefit from skilled therapeutic intervention in order to improve the following deficits  and impairments:  Abnormal gait, Decreased range of motion, Pain, Decreased strength  Visit Diagnosis: Stiffness of left knee, not elsewhere classified  Acute pain of left knee     Problem List Patient Active Problem List   Diagnosis Date Noted  . Polycythemia 07/30/2018  . Skin lesion 07/30/2018  . Snoring 07/30/2018  . Foot pain, bilateral 07/30/2018  . Hyperglycemia 07/27/2018  . Perimenopausal 04/20/2018  . Depression with anxiety 04/20/2018  . Dyslipidemia 10/17/2017  . Situational anxiety 10/17/2017  . Cervical cancer screening 10/14/2016  . Urinary incontinence 10/28/2015  . Absence of bladder continence 10/14/2015  . Preventative health care 04/20/2015  . History of chicken pox 04/10/2015  . Right leg DVT ([place]) 05/24/2014  . DUB (dysfunctional uterine bleeding) 10/11/2013  . Essential hypertension, benign 08/09/2011  . GERD (gastroesophageal reflux disease) 08/09/2011  . Migraine 08/09/2011  . Multiple renal cysts 08/09/2011  . Obesity 08/09/2011  . H/O cardiac murmur 08/09/2011  . Abnormal mammogram 08/09/2011    Sumner Boast., PT 05/07/2019, 9:27 AM  [address] [address] [place], [place], [address] Phone: [phone]   Fax:  [phone]  Name: Kim Fox MRN: [id number] Date of Birth: 09/24/1965

## 2019-05-10 ENCOUNTER — Other Ambulatory Visit: Payer: Self-pay

## 2019-05-10 ENCOUNTER — Encounter: Payer: BC Managed Care – PPO | Admitting: Physical Therapy

## 2019-05-10 ENCOUNTER — Ambulatory Visit: Payer: BC Managed Care – PPO | Admitting: Physical Therapy

## 2019-05-10 DIAGNOSIS — M25562 Pain in left knee: Secondary | ICD-10-CM

## 2019-05-10 DIAGNOSIS — M25662 Stiffness of left knee, not elsewhere classified: Secondary | ICD-10-CM

## 2019-05-10 NOTE — Therapy (Signed)
[facility] [place], [place], [address] Phone: [phone]   Fax:  [phone]  Physical Therapy Treatment  Patient Details  Name: Kim Fox MRN: [id number] Date of Birth: [birth year] Referring Provider (PT): Victorino December MD   Encounter Date: 05/10/2019  PT End of Session - 05/10/19 1012    Visit Number  9    PT Start Time  0930    PT Stop Time  6389    PT Time Calculation (min)  45 min       Past Medical History:  Diagnosis Date  . Dyslipidemia 10/17/2017  . GERD (gastroesophageal reflux disease)   . Headache(784.0)    migraine  . History of chicken pox 04/10/2015  . Hypertension   . I 04/04/2014  . Menorrhagia   . Multiple renal cysts 08/09/2011  . Murmur, cardiac    Echo WNL 2009  . Obesity   . Polycystic kidney disease   . Preventative health care 04/20/2015  . Situational anxiety 10/17/2017  . Urinary incontinence 10/28/2015    Past Surgical History:  Procedure Laterality Date  . ENDOMETRIAL BIOPSY  2010  . KIDNEY CYST REMOVAL  2002  . TONSILLECTOMY    . URETERAL STENT PLACEMENT  2002    There were no vitals filed for this visit.  Subjective Assessment - 05/10/19 0932    Subjective  "feeling pretty good". pt states that she feels her knee is bending "backward".    Currently in Pain?  No/denies                       [facility] Adult PT Treatment/Exercise - 05/10/19 0001      High Level Balance   High Level Balance Comments  cone taps on LLE      Knee/Hip Exercises: Aerobic   Recumbent Bike  Lvl 2  for 5 min     Nustep  L5x 5 min      Knee/Hip Exercises: Machines for Strengthening   Cybex Knee Extension  5# only at TKE 2x10    Cybex Knee Flexion  25# 2x10      Knee/Hip Exercises: Standing   Forward Step Up  20 reps;Step Height: 4";Hand Hold: 0    Other Standing Knee Exercises  4 way w/ red tband on airex               PT Short Term Goals -  05/03/19 0805      PT SHORT TERM GOAL #1   Title  Pt will be independent with her HEP    Status  Achieved        PT Long Term Goals - 05/10/19 1010      PT LONG TERM GOAL #1   Title  Pt will demonstrate 120 degrees of L knee flexion in order to demonstrate functional ROM    Baseline  AROM 124    Status  Achieved      PT LONG TERM GOAL #2   Title  Pt will demonstrate 5/5 strength in the L knee/ankle and Bil hips in order to demonstrate improved functional strength for decreased risk of injury related to ligament deficits.    Baseline  BIL hip and left knee WFL    Status  Achieved      PT LONG TERM GOAL #3   Title  Pt will reportpain 5/10 or less at the worst and 1/10 or less with ambulation activities in order to  demonstrate an improved quality of life.    Status  Achieved            Plan - 05/10/19 1007    Clinical Impression Statement  pt was given an HEP and went over the exercises. pt needs cues with step to limit hyperextension. pt able to perform hip 4 way on airex with red tband with min gaurding. pt has met all goals.    Personal Factors and Comorbidities  Comorbidity 2    Comorbidities  HTN, obesity    Examination-Activity Limitations  Locomotion Level;Squat;Stand;Stairs;Toileting    Stability/Clinical Decision Making  Stable/Uncomplicated    Rehab Potential  Excellent    PT Frequency  2x / week    PT Duration  4 weeks    PT Treatment/Interventions  ADLs/Self Care Home Management;Moist Heat;Cryotherapy;Electrical Stimulation;Ultrasound;Manual techniques;Neuromuscular re-education;Patient/family education;Passive range of motion;Therapeutic exercise;Balance training;Therapeutic activities;Functional mobility training;Stair training;Gait training;Taping;Vasopneumatic Device    PT Next Visit Plan  pt on hold to sees MD 11/30. Pt will continue with HEP       Patient will benefit from skilled therapeutic intervention in order to improve the following deficits and  impairments:  Abnormal gait, Decreased range of motion, Pain, Decreased strength  Visit Diagnosis: Stiffness of left knee, not elsewhere classified  Acute pain of left knee     Problem List Patient Active Problem List   Diagnosis Date Noted  . Polycythemia 07/30/2018  . Skin lesion 07/30/2018  . Snoring 07/30/2018  . Foot pain, bilateral 07/30/2018  . Hyperglycemia 07/27/2018  . Perimenopausal 04/20/2018  . Depression with anxiety 04/20/2018  . Dyslipidemia 10/17/2017  . Situational anxiety 10/17/2017  . Cervical cancer screening 10/14/2016  . Urinary incontinence 10/28/2015  . Absence of bladder continence 10/14/2015  . Preventative health care 04/20/2015  . History of chicken pox 04/10/2015  . Right leg DVT ([place]) 05/24/2014  . DUB (dysfunctional uterine bleeding) 10/11/2013  . Essential hypertension, benign 08/09/2011  . GERD (gastroesophageal reflux disease) 08/09/2011  . Migraine 08/09/2011  . Multiple renal cysts 08/09/2011  . Obesity 08/09/2011  . H/O cardiac murmur 08/09/2011  . Abnormal mammogram 08/09/2011    Barrett Henle, [place] 05/10/2019, 10:18 AM  [facility] [place], [place], [address] Phone: [phone]   Fax:  [phone]  Name: Vaness Jelinski MRN: [id number] Date of Birth: [birth year]

## 2019-05-10 NOTE — Therapy (Deleted)
[facility] [place], [place], [address] Phone: [phone]   Fax:  [phone]  Physical Therapy Treatment  Patient Details  Name: Kim Fox MRN: [id number] Date of Birth: [birth year] Referring Provider (PT): Victorino December MD   Encounter Date: 05/10/2019  PT End of Session - 05/10/19 1012    Visit Number  9    PT Start Time  0930    PT Stop Time  6389    PT Time Calculation (min)  45 min       Past Medical History:  Diagnosis Date  . Dyslipidemia 10/17/2017  . GERD (gastroesophageal reflux disease)   . Headache(784.0)    migraine  . History of chicken pox 04/10/2015  . Hypertension   . I 04/04/2014  . Menorrhagia   . Multiple renal cysts 08/09/2011  . Murmur, cardiac    Echo WNL 2009  . Obesity   . Polycystic kidney disease   . Preventative health care 04/20/2015  . Situational anxiety 10/17/2017  . Urinary incontinence 10/28/2015    Past Surgical History:  Procedure Laterality Date  . ENDOMETRIAL BIOPSY  2010  . KIDNEY CYST REMOVAL  2002  . TONSILLECTOMY    . URETERAL STENT PLACEMENT  2002    There were no vitals filed for this visit.  Subjective Assessment - 05/10/19 0932    Subjective  "feeling pretty good". pt states that she feels her knee is bending "backward".    Currently in Pain?  No/denies                       [facility] Adult PT Treatment/Exercise - 05/10/19 0001      High Level Balance   High Level Balance Comments  cone taps on LLE      Knee/Hip Exercises: Aerobic   Recumbent Bike  Lvl 2  for 5 min     Nustep  L5x 5 min      Knee/Hip Exercises: Machines for Strengthening   Cybex Knee Extension  5# only at TKE 2x10    Cybex Knee Flexion  25# 2x10      Knee/Hip Exercises: Standing   Forward Step Up  20 reps;Step Height: 4";Hand Hold: 0    Other Standing Knee Exercises  4 way w/ red tband on airex               PT Short Term Goals -  05/03/19 0805      PT SHORT TERM GOAL #1   Title  Pt will be independent with her HEP    Status  Achieved        PT Long Term Goals - 05/10/19 1010      PT LONG TERM GOAL #1   Title  Pt will demonstrate 120 degrees of L knee flexion in order to demonstrate functional ROM    Baseline  AROM 124    Status  Achieved      PT LONG TERM GOAL #2   Title  Pt will demonstrate 5/5 strength in the L knee/ankle and Bil hips in order to demonstrate improved functional strength for decreased risk of injury related to ligament deficits.    Baseline  BIL hip and left knee WFL    Status  Achieved      PT LONG TERM GOAL #3   Title  Pt will reportpain 5/10 or less at the worst and 1/10 or less with ambulation activities in order to  demonstrate an improved quality of life.    Status  Achieved            Plan - 05/10/19 1007    Clinical Impression Statement  pt was given an HEP and went over the exercises. pt needs cues with step to limit hyperextension. pt able to perform hip 4 way on airex with red tband with min gaurding. pt has met all goals.    Personal Factors and Comorbidities  Comorbidity 2    Comorbidities  HTN, obesity    Examination-Activity Limitations  Locomotion Level;Squat;Stand;Stairs;Toileting    Stability/Clinical Decision Making  Stable/Uncomplicated    Rehab Potential  Excellent    PT Frequency  2x / week    PT Duration  4 weeks    PT Treatment/Interventions  ADLs/Self Care Home Management;Moist Heat;Cryotherapy;Electrical Stimulation;Ultrasound;Manual techniques;Neuromuscular re-education;Patient/family education;Passive range of motion;Therapeutic exercise;Balance training;Therapeutic activities;Functional mobility training;Stair training;Gait training;Taping;Vasopneumatic Device    PT Next Visit Plan  she will need a good HEP as she will see [place] one more time and then see the MD on the 30th       Patient will benefit from skilled therapeutic intervention in order to  improve the following deficits and impairments:  Abnormal gait, Decreased range of motion, Pain, Decreased strength  Visit Diagnosis: Stiffness of left knee, not elsewhere classified  Acute pain of left knee     Problem List Patient Active Problem List   Diagnosis Date Noted  . Polycythemia 07/30/2018  . Skin lesion 07/30/2018  . Snoring 07/30/2018  . Foot pain, bilateral 07/30/2018  . Hyperglycemia 07/27/2018  . Perimenopausal 04/20/2018  . Depression with anxiety 04/20/2018  . Dyslipidemia 10/17/2017  . Situational anxiety 10/17/2017  . Cervical cancer screening 10/14/2016  . Urinary incontinence 10/28/2015  . Absence of bladder continence 10/14/2015  . Preventative health care 04/20/2015  . History of chicken pox 04/10/2015  . Right leg DVT ([place]) 05/24/2014  . DUB (dysfunctional uterine bleeding) 10/11/2013  . Essential hypertension, benign 08/09/2011  . GERD (gastroesophageal reflux disease) 08/09/2011  . Migraine 08/09/2011  . Multiple renal cysts 08/09/2011  . Obesity 08/09/2011  . H/O cardiac murmur 08/09/2011  . Abnormal mammogram 08/09/2011    Barrett Henle, [place] 05/10/2019, 10:15 AM  [place], [place], [address] Phone: [phone]   Fax:  [phone]  Name: Kim Fox MRN: [id number] Date of Birth: [birth year]

## 2019-05-12 DIAGNOSIS — F4323 Adjustment disorder with mixed anxiety and depressed mood: Secondary | ICD-10-CM | POA: Diagnosis not present

## 2019-05-21 DIAGNOSIS — S83512D Sprain of anterior cruciate ligament of left knee, subsequent encounter: Secondary | ICD-10-CM | POA: Diagnosis not present

## 2019-06-26 ENCOUNTER — Ambulatory Visit: Payer: BC Managed Care – PPO | Admitting: Physical Therapy

## 2019-07-13 DIAGNOSIS — F4323 Adjustment disorder with mixed anxiety and depressed mood: Secondary | ICD-10-CM | POA: Diagnosis not present

## 2019-07-31 ENCOUNTER — Ambulatory Visit: Payer: BC Managed Care – PPO | Admitting: Family Medicine

## 2019-07-31 ENCOUNTER — Other Ambulatory Visit: Payer: Self-pay

## 2019-07-31 ENCOUNTER — Encounter: Payer: Self-pay | Admitting: Family Medicine

## 2019-07-31 DIAGNOSIS — I10 Essential (primary) hypertension: Secondary | ICD-10-CM

## 2019-07-31 DIAGNOSIS — R739 Hyperglycemia, unspecified: Secondary | ICD-10-CM

## 2019-07-31 DIAGNOSIS — G8929 Other chronic pain: Secondary | ICD-10-CM

## 2019-07-31 DIAGNOSIS — E559 Vitamin D deficiency, unspecified: Secondary | ICD-10-CM | POA: Insufficient documentation

## 2019-07-31 DIAGNOSIS — E785 Hyperlipidemia, unspecified: Secondary | ICD-10-CM | POA: Diagnosis not present

## 2019-07-31 DIAGNOSIS — M25562 Pain in left knee: Secondary | ICD-10-CM | POA: Insufficient documentation

## 2019-07-31 DIAGNOSIS — F418 Other specified anxiety disorders: Secondary | ICD-10-CM

## 2019-07-31 LAB — COMPREHENSIVE METABOLIC PANEL
ALT: 18 U/L (ref 0–35)
AST: 19 U/L (ref 0–37)
Albumin: 3.8 g/dL (ref 3.5–5.2)
Alkaline Phosphatase: 81 U/L (ref 39–117)
BUN: 19 mg/dL (ref 6–23)
CO2: 31 mEq/L (ref 19–32)
Calcium: 9.7 mg/dL (ref 8.4–10.5)
Chloride: 104 mEq/L (ref 96–112)
Creatinine, Ser: 0.82 mg/dL (ref 0.40–1.20)
GFR: 73.15 mL/min (ref >60.00)
Glucose, Bld: 102 mg/dL — ABNORMAL HIGH (ref 70–99)
Potassium: 4.8 mEq/L (ref 3.5–5.1)
Sodium: 140 mEq/L (ref 135–145)
Total Bilirubin: 0.6 mg/dL (ref 0.2–1.2)
Total Protein: 7.3 g/dL (ref 6.0–8.3)

## 2019-07-31 LAB — CBC WITH DIFFERENTIAL/PLATELET
Basophils Absolute: 0.1 10*3/uL (ref 0.0–0.1)
Basophils Relative: 1.2 % (ref 0.0–3.0)
Eosinophils Absolute: 0.1 10*3/uL (ref 0.0–0.7)
Eosinophils Relative: 1.5 % (ref 0.0–5.0)
HCT: 46.9 % — ABNORMAL HIGH (ref 36.0–46.0)
Hemoglobin: 15.4 g/dL — ABNORMAL HIGH (ref 12.0–15.0)
Lymphocytes Relative: 27 % (ref 12.0–46.0)
Lymphs Abs: 2.4 10*3/uL (ref 0.7–4.0)
MCHC: 32.7 g/dL (ref 30.0–36.0)
MCV: 88.3 fl (ref 78.0–100.0)
Monocytes Absolute: 0.7 10*3/uL (ref 0.1–1.0)
Monocytes Relative: 7.6 % (ref 3.0–12.0)
Neutro Abs: 5.6 10*3/uL (ref 1.4–7.7)
Neutrophils Relative %: 62.7 % (ref 43.0–77.0)
Platelets: 247 10*3/uL (ref 150.0–400.0)
RBC: 5.32 Mil/uL — ABNORMAL HIGH (ref 3.87–5.11)
RDW: 14.3 % (ref 11.5–15.5)
WBC: 9 10*3/uL (ref 4.0–10.5)

## 2019-07-31 LAB — TSH: TSH: 1.28 u[IU]/mL (ref 0.35–4.50)

## 2019-07-31 LAB — VITAMIN D 25 HYDROXY (VIT D DEFICIENCY, FRACTURES): VITD: 31.29 ng/mL (ref 30.00–100.00)

## 2019-07-31 LAB — LIPID PANEL
Cholesterol: 186 mg/dL (ref 0–200)
HDL: 50.4 mg/dL (ref >39.00)
LDL Cholesterol: 105 mg/dL — ABNORMAL HIGH (ref 0–99)
NonHDL: 136.04
Total CHOL/HDL Ratio: 4
Triglycerides: 155 mg/dL — ABNORMAL HIGH (ref 0.0–149.0)
VLDL: 31 mg/dL (ref 0.0–40.0)

## 2019-07-31 LAB — HEMOGLOBIN A1C: Hgb A1c MFr Bld: 6.1 % (ref 4.6–6.5)

## 2019-07-31 NOTE — Assessment & Plan Note (Signed)
Encouraged heart healthy diet, increase exercise, avoid trans fats, consider a krill oil cap daily 

## 2019-07-31 NOTE — Assessment & Plan Note (Signed)
She has been workign with [facility] therapy and is improving but she injured her knee and suffered a fracture, medial meniscal and ligament injury. Encouraged ice and topical treatments

## 2019-07-31 NOTE — Assessment & Plan Note (Signed)
Notes she is more tired and forgetful and is less interested in talking to her siblings. She is wondering if the Fluoxetine is making her less engaged. Will try dropping th Fluoxetine to 10 mg daily. She notes she is no longer crying or getting really mad. Reassess in 3 months

## 2019-07-31 NOTE — Assessment & Plan Note (Signed)
Taking a calcium with vitamin d daily will recheck levels

## 2019-07-31 NOTE — Progress Notes (Signed)
Subjective:    Patient ID: Kim Fox, female    DOB: September 13, 1965, 55 y.o.   MRN: [id number]  Chief Complaint  Patient presents with  . Follow-up  . Medication Problem    coming off prozac    HPI Patient is in today for follow up on chronic medical concerns. She is working from home and frustrated with weight gain but is otherwise doing well. No recent febrile illness or hospitalizations. She injured her knee and has been undergoing PT with some benefit. She denies any other acute cocnerns. Denies CP/palp/SOB/HA/congestion/fevers/GI or GU c/o. Taking meds as prescribed  Past Medical History:  Diagnosis Date  . Dyslipidemia 10/17/2017  . GERD (gastroesophageal reflux disease)   . Headache(784.0)    migraine  . History of chicken pox 04/10/2015  . Hypertension   . I 04/04/2014  . Menorrhagia   . Multiple renal cysts 08/09/2011  . Murmur, cardiac    Echo WNL 2009  . Obesity   . Polycystic kidney disease   . Preventative health care 04/20/2015  . Situational anxiety 10/17/2017  . Urinary incontinence 10/28/2015    Past Surgical History:  Procedure Laterality Date  . ENDOMETRIAL BIOPSY  2010  . KIDNEY CYST REMOVAL  2002  . TONSILLECTOMY    . URETERAL STENT PLACEMENT  2002    Family History  Problem Relation Age of Onset  . Diabetes Mother        insulin dependent  . Hypertension Mother   . Migraines Mother   . Varicose Veins Mother   . Arthritis Mother   . Diabetes Father   . Hypertension Father   . Kidney disease Father   . Arthritis Father   . Migraines Sister   . Allergies Sister   . Renal Cyst Sister   . Cancer Sister        skin cancer  . Dysfunctional uterine bleeding Sister   . Cancer Paternal Grandmother        lung  . Cancer Paternal Grandfather        bladder, prostate  . Heart disease Paternal Grandfather   . Hyperlipidemia Daughter   . Fibromyalgia Sister   . Renal Cyst Sister   . Hypertension Sister   . Dysfunctional uterine bleeding  Sister     Social History   Socioeconomic History  . Marital status: Divorced    Spouse name: Not on file  . Number of children: 2  . Years of education: college  . Highest education level: Not on file  Occupational History  . Occupation: Aeronautical engineer: [employer]  Tobacco Use  . Smoking status: Never Smoker  . Smokeless tobacco: Never Used  Substance and Sexual Activity  . Alcohol use: Yes    Comment: occ  . Drug use: No  . Sexual activity: Not on file  Other Topics Concern  . Not on file  Social History Narrative  . Not on file   Social Determinants of Health   Financial Resource Strain:   . Difficulty of Paying Living Expenses: Not on file  Food Insecurity:   . Worried About Charity fundraiser in the Last Year: Not on file  . Ran Out of Food in the Last Year: Not on file  Transportation Needs:   . Lack of Transportation (Medical): Not on file  . Lack of Transportation (Non-Medical): Not on file  Physical Activity:   . Days of Exercise per Week: Not on file  .  Minutes of Exercise per Session: Not on file  Stress:   . Feeling of Stress : Not on file  Social Connections:   . Frequency of Communication with Friends and Family: Not on file  . Frequency of Social Gatherings with Friends and Family: Not on file  . Attends Religious Services: Not on file  . Active Member of Clubs or Organizations: Not on file  . Attends Archivist Meetings: Not on file  . Marital Status: Not on file  Intimate Partner Violence:   . Fear of Current or Ex-Partner: Not on file  . Emotionally Abused: Not on file  . Physically Abused: Not on file  . Sexually Abused: Not on file    Outpatient Medications Prior to Visit  Medication Sig Dispense Refill  . Ascorbic Acid (VITAMIN C) 1000 MG tablet Take 1,000 mg by mouth daily.    Marland Kitchen aspirin EC 81 MG tablet Take 81 mg by mouth 2 (two) times daily.    Marland Kitchen atenolol (TENORMIN) 50 MG tablet TAKE 1 TABLET(50  MG) BY MOUTH DAILY 90 tablet 2  . Biotin 5000 MCG CAPS Take 1 capsule by mouth daily.    . Calcium Carbonate-Vitamin D (CALCIUM-VITAMIN D) 600-200 MG-UNIT CAPS Take 1 tablet by mouth 2 (two) times daily.    . famotidine (PEPCID) 40 MG tablet Take 0.5-1 tablets (20-40 mg total) by mouth at bedtime as needed for heartburn or indigestion. 90 tablet 2  . FLUoxetine (PROZAC) 20 MG tablet Take 1 tablet (20 mg total) by mouth daily. 90 tablet 1  . folic acid (FOLVITE) 174 MCG tablet Take 400 mcg by mouth daily.    Marland Kitchen L-Lysine 1000 MG TABS Take 1 tablet by mouth daily.    . Multiple Vitamin (MULTIVITAMIN) tablet Take 1 tablet by mouth daily.    . multivitamin-lutein (OCUVITE-LUTEIN) CAPS capsule Take 1 capsule by mouth daily.    . Probiotic Product (ACIDOPHILUS/GOAT MILK) CAPS Take by mouth.    . vitamin B-12 (CYANOCOBALAMIN) 500 MCG tablet Take 500 mcg by mouth daily.    Marland Kitchen HYDROcodone-acetaminophen (NORCO/VICODIN) 5-325 MG tablet Take 1 tablet by mouth every 6 (six) hours as needed for severe pain. 10 tablet 0  . SUMAtriptan (IMITREX) 100 MG tablet Take at the onset of HA.  May repeat in 2 hours if needed.  Max dose 2/24 hours, 4/week (Patient not taking: Reported on 07/31/2019) 10 tablet 3   No facility-administered medications prior to visit.    Allergies  Allergen Reactions  . Nitrofurantoin Rash  . Macrodantin Rash  . Penicillins Rash    Review of Systems  Constitutional: Positive for malaise/fatigue. Negative for fever.  HENT: Negative for congestion.   Eyes: Negative for blurred vision.  Respiratory: Negative for shortness of breath.   Cardiovascular: Negative for chest pain, palpitations and leg swelling.  Gastrointestinal: Negative for abdominal pain, blood in stool and nausea.  Genitourinary: Negative for dysuria and frequency.  Musculoskeletal: Positive for joint pain. Negative for falls.  Skin: Negative for rash.  Neurological: Negative for dizziness, loss of consciousness and  headaches.  Endo/Heme/Allergies: Negative for environmental allergies.  Psychiatric/Behavioral: Positive for depression. The patient is nervous/anxious.        Objective:    Physical Exam Vitals and nursing note reviewed.  Constitutional:      General: She is not in acute distress.    Appearance: She is well-developed.  HENT:     Head: Normocephalic and atraumatic.     Nose: Nose normal.  Eyes:  General:        Right eye: No discharge.        Left eye: No discharge.  Cardiovascular:     Rate and Rhythm: Normal rate and regular rhythm.     Heart sounds: No murmur.  Pulmonary:     Effort: Pulmonary effort is normal.     Breath sounds: Normal breath sounds.  Abdominal:     General: Bowel sounds are normal.     Palpations: Abdomen is soft.     Tenderness: There is no abdominal tenderness.  Musculoskeletal:     Cervical back: Normal range of motion and neck supple.  Skin:    General: Skin is warm and dry.  Neurological:     Mental Status: She is alert and oriented to person, place, and time.     BP 130/84 (BP Location: Left Arm, Patient Position: Sitting, Cuff Size: Large)   Pulse 64   Temp (!) 96.3 F (35.7 C) (Temporal)   Ht 5' 8" (1.727 m)   Wt 287 lb (130.2 kg)   SpO2 97%   BMI 43.64 kg/m  Wt Readings from Last 3 Encounters:  07/31/19 287 lb (130.2 kg)  03/20/19 270 lb (122.5 kg)  01/25/19 270 lb (122.5 kg)    Diabetic Foot Exam - Simple   No data filed     Lab Results  Component Value Date   WBC 9.0 07/31/2019   HGB 15.4 (H) 07/31/2019   HCT 46.9 (H) 07/31/2019   PLT 247.0 07/31/2019   GLUCOSE 102 (H) 07/31/2019   CHOL 186 07/31/2019   TRIG 155.0 (H) 07/31/2019   HDL 50.40 07/31/2019   LDLDIRECT 117.0 07/27/2018   LDLCALC 105 (H) 07/31/2019   ALT 18 07/31/2019   AST 19 07/31/2019   NA 140 07/31/2019   K 4.8 07/31/2019   CL 104 07/31/2019   CREATININE 0.82 07/31/2019   BUN 19 07/31/2019   CO2 31 07/31/2019   TSH 1.28 07/31/2019   INR  1.03 03/25/2014   HGBA1C 6.1 07/31/2019   MICROALBUR 1.0 10/17/2017    Lab Results  Component Value Date   TSH 1.28 07/31/2019   Lab Results  Component Value Date   WBC 9.0 07/31/2019   HGB 15.4 (H) 07/31/2019   HCT 46.9 (H) 07/31/2019   MCV 88.3 07/31/2019   PLT 247.0 07/31/2019   Lab Results  Component Value Date   NA 140 07/31/2019   K 4.8 07/31/2019   CHLORIDE 108 10/07/2016   CO2 31 07/31/2019   GLUCOSE 102 (H) 07/31/2019   BUN 19 07/31/2019   CREATININE 0.82 07/31/2019   BILITOT 0.6 07/31/2019   ALKPHOS 81 07/31/2019   AST 19 07/31/2019   ALT 18 07/31/2019   PROT 7.3 07/31/2019   ALBUMIN 3.8 07/31/2019   CALCIUM 9.7 07/31/2019   ANIONGAP 8 10/07/2016   EGFR 80 (L) 10/07/2016   GFR 73.15 07/31/2019   Lab Results  Component Value Date   CHOL 186 07/31/2019   Lab Results  Component Value Date   HDL 50.40 07/31/2019   Lab Results  Component Value Date   LDLCALC 105 (H) 07/31/2019   Lab Results  Component Value Date   TRIG 155.0 (H) 07/31/2019   Lab Results  Component Value Date   CHOLHDL 4 07/31/2019   Lab Results  Component Value Date   HGBA1C 6.1 07/31/2019       Assessment & Plan:   Problem List Items Addressed This Visit    Essential hypertension,  benign    Well controlled, no changes to meds. Encouraged heart healthy diet such as the DASH diet and exercise as tolerated.       Relevant Orders   Comprehensive metabolic panel (Completed)   CBC with Differential/Platelet (Completed)   TSH (Completed)   Dyslipidemia    Encouraged heart healthy diet, increase exercise, avoid trans fats, consider a krill oil cap daily      Relevant Orders   Lipid panel (Completed)   Depression with anxiety    Notes she is more tired and forgetful and is less interested in talking to her siblings. She is wondering if the Fluoxetine is making her less engaged. Will try dropping th Fluoxetine to 10 mg daily. She notes she is no longer crying or getting  really mad. Reassess in 3 months      Hyperglycemia    hgba1c acceptable, minimize simple carbs. Increase exercise as tolerated.      Relevant Orders   Hemoglobin A1c (Completed)   Vitamin D deficiency    Taking a calcium with vitamin d daily will recheck levels      Relevant Orders   VITAMIN D 25 Hydroxy (Vit-D Deficiency, Fractures) (Completed)   Knee pain, left    She has been workign with [facility] therapy and is improving but she injured her knee and suffered a fracture, medial meniscal and ligament injury. Encouraged ice and topical treatments         I have discontinued Kim Fox "Cathy"'s HYDROcodone-acetaminophen. I am also having her maintain her L-Lysine, Biotin, multivitamin, Calcium-Vitamin D, folic acid, SUMAtriptan, Acidophilus/Goat Milk, aspirin EC, multivitamin-lutein, atenolol, famotidine, FLUoxetine, vitamin C, and vitamin B-12.  No orders of the defined types were placed in this encounter.    Penni Homans, MD

## 2019-07-31 NOTE — Patient Instructions (Addendum)
MIND diet  Weight Watchers APP or NOOM APP  Omron Blood Pressure cuff, upper arm, want BP 100-140/60-90 Pulse oximeter, want oxygen in 90s  Weekly vitals  Take Multivitamin with minerals, selenium Vitamin D 1000-2000 IU daily Probiotic with lactobacillus and bifidophilus Asprin EC 81 mg daily  Melatonin 2-5 mg at bedtime  [email or web address]   DASH Eating Plan DASH stands for "Dietary Approaches to Stop Hypertension." The DASH eating plan is a healthy eating plan that has been shown to reduce high blood pressure (hypertension). It may also reduce your risk for type 2 diabetes, heart disease, and stroke. The DASH eating plan may also help with weight loss. What are tips for following this plan?  General guidelines  Avoid eating more than 2,300 mg (milligrams) of salt (sodium) a day. If you have hypertension, you may need to reduce your sodium intake to 1,500 mg a day.  Limit alcohol intake to no more than 1 drink a day for nonpregnant women and 2 drinks a day for men. One drink equals 12 oz of beer, 5 oz of wine, or 1 oz of hard liquor.  Work with your health care provider to maintain a healthy body weight or to lose weight. Ask what an ideal weight is for you.  Get at least 30 minutes of exercise that causes your heart to beat faster (aerobic exercise) most days of the week. Activities may include walking, swimming, or biking.  Work with your health care provider or diet and nutrition specialist (dietitian) to adjust your eating plan to your individual calorie needs. Reading food labels   Check food labels for the amount of sodium per serving. Choose foods with less than 5 percent of the Daily Value of sodium. Generally, foods with less than 300 mg of sodium per serving fit into this eating plan.  To find whole grains, look for the word "whole" as the first word in the ingredient list. Shopping  Buy products labeled as "low-sodium" or "no  salt added."  Buy fresh foods. Avoid canned foods and premade or frozen meals. Cooking  Avoid adding salt when cooking. Use salt-free seasonings or herbs instead of table salt or sea salt. Check with your health care provider or pharmacist before using salt substitutes.  Do not fry foods. Cook foods using healthy methods such as baking, boiling, grilling, and broiling instead.  Cook with heart-healthy oils, such as olive, canola, soybean, or sunflower oil. Meal planning  Eat a balanced diet that includes: ? 5 or more servings of fruits and vegetables each day. At each meal, try to fill half of your plate with fruits and vegetables. ? Up to 6-8 servings of whole grains each day. ? Less than 6 oz of lean meat, poultry, or fish each day. A 3-oz serving of meat is about the same size as a deck of cards. One egg equals 1 oz. ? 2 servings of low-fat dairy each day. ? A serving of nuts, seeds, or beans 5 times each week. ? Heart-healthy fats. Healthy fats called Omega-3 fatty acids are found in foods such as flaxseeds and coldwater fish, like sardines, salmon, and mackerel.  Limit how much you eat of the following: ? Canned or prepackaged foods. ? Food that is high in trans fat, such as fried foods. ? Food that is high in saturated fat, such as fatty meat. ? Sweets, desserts, sugary drinks, and other foods with added sugar. ? Full-fat dairy products.  Do not salt foods before eating.  Try to eat at least 2 vegetarian meals each week.  Eat more home-cooked food and less restaurant, buffet, and fast food.  When eating at a restaurant, ask that your food be prepared with less salt or no salt, if possible. What foods are recommended? The items listed may not be a complete list. Talk with your dietitian about what dietary choices are best for you. Grains Whole-grain or whole-wheat bread. Whole-grain or whole-wheat pasta. Brown rice. [place]. Bulgur. Whole-grain and low-sodium  cereals. Pita bread. Low-fat, low-sodium crackers. Whole-wheat flour tortillas. Vegetables Fresh or frozen vegetables (raw, steamed, roasted, or grilled). Low-sodium or reduced-sodium tomato and vegetable juice. Low-sodium or reduced-sodium tomato sauce and tomato paste. Low-sodium or reduced-sodium canned vegetables. Fruits All fresh, dried, or frozen fruit. Canned fruit in natural juice (without added sugar). Meat and other protein foods Skinless chicken or [place]. Ground chicken or [place]. Pork with fat trimmed off. Fish and seafood. Egg whites. Dried beans, peas, or lentils. Unsalted nuts, nut butters, and seeds. Unsalted canned beans. Lean cuts of beef with fat trimmed off. Low-sodium, lean deli meat. Dairy Low-fat (1%) or fat-free (skim) milk. Fat-free, low-fat, or reduced-fat cheeses. Nonfat, low-sodium ricotta or cottage cheese. Low-fat or nonfat yogurt. Low-fat, low-sodium cheese. Fats and oils Soft margarine without trans fats. Vegetable oil. Low-fat, reduced-fat, or light mayonnaise and salad dressings (reduced-sodium). Canola, safflower, olive, soybean, and sunflower oils. Avocado. Seasoning and other foods Herbs. Spices. Seasoning mixes without salt. Unsalted popcorn and pretzels. Fat-free sweets. What foods are not recommended? The items listed may not be a complete list. Talk with your dietitian about what dietary choices are best for you. Grains Baked goods made with fat, such as croissants, muffins, or some breads. Dry pasta or rice meal packs. Vegetables Creamed or fried vegetables. Vegetables in a cheese sauce. Regular canned vegetables (not low-sodium or reduced-sodium). Regular canned tomato sauce and paste (not low-sodium or reduced-sodium). Regular tomato and vegetable juice (not low-sodium or reduced-sodium). Angie Fava. Olives. Fruits Canned fruit in a light or heavy syrup. Fried fruit. Fruit in cream or butter sauce. Meat and other protein foods Fatty cuts of meat. Ribs.  Fried meat. Berniece Salines. Sausage. Bologna and other processed lunch meats. Salami. Fatback. Hotdogs. Bratwurst. Salted nuts and seeds. Canned beans with added salt. Canned or smoked fish. Whole eggs or egg yolks. Chicken or [place] with skin. Dairy Whole or 2% milk, cream, and half-and-half. Whole or full-fat cream cheese. Whole-fat or sweetened yogurt. Full-fat cheese. Nondairy creamers. Whipped toppings. Processed cheese and cheese spreads. Fats and oils Butter. Stick margarine. Lard. Shortening. Ghee. Bacon fat. Tropical oils, such as coconut, palm kernel, or palm oil. Seasoning and other foods Salted popcorn and pretzels. Onion salt, garlic salt, seasoned salt, table salt, and sea salt. Worcestershire sauce. Tartar sauce. Barbecue sauce. Teriyaki sauce. Soy sauce, including reduced-sodium. Steak sauce. Canned and packaged gravies. Fish sauce. Oyster sauce. Cocktail sauce. Horseradish that you find on the shelf. Ketchup. Mustard. Meat flavorings and tenderizers. Bouillon cubes. Hot sauce and Tabasco sauce. Premade or packaged marinades. Premade or packaged taco seasonings. Relishes. Regular salad dressings. Where to find more information:  National Heart, Lung, and [place]: https://wilson-eaton.com/  American Heart Association: www.heart.org Summary  The DASH eating plan is a healthy eating plan that has been shown to reduce high blood pressure (hypertension). It may also reduce your risk for type 2 diabetes, heart disease, and stroke.  With the DASH eating plan, you should limit salt (sodium) intake to 2,300 mg a day. If  you have hypertension, you may need to reduce your sodium intake to 1,500 mg a day.  When on the DASH eating plan, aim to eat more fresh fruits and vegetables, whole grains, lean proteins, low-fat dairy, and heart-healthy fats.  Work with your health care provider or diet and nutrition specialist (dietitian) to adjust your eating plan to your individual calorie needs. This  information is not intended to replace advice given to you by your health care provider. Make sure you discuss any questions you have with your health care provider. Document Revised: 05/20/2017 Document Reviewed: 05/31/2016 Elsevier Patient Education  2020 [employer].

## 2019-07-31 NOTE — Assessment & Plan Note (Signed)
Well controlled, no changes to meds. Encouraged heart healthy diet such as the DASH diet and exercise as tolerated.  °

## 2019-07-31 NOTE — Assessment & Plan Note (Signed)
hgba1c acceptable, minimize simple carbs. Increase exercise as tolerated.  

## 2019-08-11 DIAGNOSIS — F4323 Adjustment disorder with mixed anxiety and depressed mood: Secondary | ICD-10-CM | POA: Diagnosis not present

## 2019-09-08 DIAGNOSIS — F4323 Adjustment disorder with mixed anxiety and depressed mood: Secondary | ICD-10-CM | POA: Diagnosis not present

## 2019-09-14 ENCOUNTER — Encounter: Payer: Self-pay | Admitting: Internal Medicine

## 2019-09-14 ENCOUNTER — Other Ambulatory Visit: Payer: Self-pay

## 2019-09-14 ENCOUNTER — Ambulatory Visit (HOSPITAL_BASED_OUTPATIENT_CLINIC_OR_DEPARTMENT_OTHER)
Admission: RE | Admit: 2019-09-14 | Discharge: 2019-09-14 | Disposition: A | Discharge status: Home / Self Care | Payer: BC Managed Care – PPO | Source: Ambulatory Visit | Attending: Internal Medicine | Admitting: Internal Medicine

## 2019-09-14 ENCOUNTER — Ambulatory Visit: Payer: BC Managed Care – PPO | Admitting: Internal Medicine

## 2019-09-14 VITALS — BP 138/79 | HR 58 | Temp 96.6°F | Resp 16 | Ht 68.0 in | Wt 282.4 lb

## 2019-09-14 DIAGNOSIS — M25572 Pain in left ankle and joints of left foot: Secondary | ICD-10-CM | POA: Diagnosis not present

## 2019-09-14 DIAGNOSIS — M25472 Effusion, left ankle: Secondary | ICD-10-CM | POA: Diagnosis not present

## 2019-09-14 DIAGNOSIS — M7989 Other specified soft tissue disorders: Secondary | ICD-10-CM | POA: Diagnosis not present

## 2019-09-14 NOTE — Progress Notes (Signed)
Subjective:    Patient ID: Kim Fox, female    DOB: [birth year], 54 y.o.   MRN: [id number]  DOS:  09/14/2019 Type of visit - description: Acute Approximately 12 days ago he noted pain and swelling at the left ankle, mostly at the external side. The area was not hot or red. No history of gout She does have a history of right leg clot.  2 days before the sxs started her left foot slipped when she was going up onto her RV, in the process she is not sure if she twisted the ankle.    Review of Systems See above   Past Medical History:  Diagnosis Date  . Dyslipidemia 10/17/2017  . GERD (gastroesophageal reflux disease)   . Headache(784.0)    migraine  . History of chicken pox 04/10/2015  . Hypertension   . I 04/04/2014  . Menorrhagia   . Multiple renal cysts 08/09/2011  . Murmur, cardiac    Echo WNL 2009  . Obesity   . Polycystic kidney disease   . Preventative health care 04/20/2015  . Situational anxiety 10/17/2017  . Urinary incontinence 10/28/2015    Past Surgical History:  Procedure Laterality Date  . ENDOMETRIAL BIOPSY  2010  . KIDNEY CYST REMOVAL  2002  . TONSILLECTOMY    . URETERAL STENT PLACEMENT  2002    Allergies as of 09/14/2019      Reactions   Nitrofurantoin Rash   Macrodantin Rash   Penicillins Rash      Medication List       Accurate as of September 14, 2019 11:59 PM. If you have any questions, ask your nurse or doctor.        Acidophilus/Goat Milk Caps Take by mouth.   aspirin EC 81 MG tablet Take 81 mg by mouth 2 (two) times daily.   atenolol 50 MG tablet Commonly known as: TENORMIN TAKE 1 TABLET(50 MG) BY MOUTH DAILY   Biotin 5000 MCG Caps Take 1 capsule by mouth daily.   Calcium-Vitamin D 600-200 MG-UNIT Caps Take 1 tablet by mouth 2 (two) times daily.   famotidine 40 MG tablet Commonly known as: PEPCID Take 0.5-1 tablets (20-40 mg total) by mouth at bedtime as needed for heartburn or indigestion.   FLUoxetine 20 MG  tablet Commonly known as: PROZAC Take 1 tablet (20 mg total) by mouth daily.   folic acid Q000111Q MCG tablet Commonly known as: FOLVITE Take 400 mcg by mouth daily.   L-Lysine 1000 MG Tabs Take 1 tablet by mouth daily.   multivitamin tablet Take 1 tablet by mouth daily.   multivitamin-lutein Caps capsule Take 1 capsule by mouth daily.   OMEGA 3-6-9 COMPLEX PO Omega 3-6-9   SUMAtriptan 100 MG tablet Commonly known as: IMITREX Take at the onset of HA.  May repeat in 2 hours if needed.  Max dose 2/24 hours, 4/week   vitamin B-12 500 MCG tablet Commonly known as: CYANOCOBALAMIN Take 500 mcg by mouth daily.   vitamin C 1000 MG tablet Take 1,000 mg by mouth daily.   VITAMIN D PO Take by mouth.          Objective:   Physical Exam Musculoskeletal:       Feet:    BP 138/79 (BP Location: Left Arm, Patient Position: Sitting, Cuff Size: Normal)   Pulse (!) 58   Temp (!) 96.6 F (35.9 C) (Temporal)   Resp 16   Ht 5\' 8"  (1.727 m)   Wt 282 lb  6 oz (128.1 kg)   SpO2 98%   BMI 42.93 kg/m  General:   Well developed, NAD, BMI noted. HEENT:  Normocephalic . Face symmetric, atraumatic MSK: Right leg normal Left leg: Calf symmetric to the right side when measured.  Soft, no TTP Mild puffiness at the external malleolus but no redness or warmness. Slightly TTP frontally.  See graphic Good left pedal pulse Skin: Not pale. Not jaundice Neurologic:  alert & oriented X3.  Speech normal, gait appropriate for age and unassisted Psych--  Cognition and judgment appear intact.  Cooperative with normal attention span and concentration.  Behavior appropriate. No anxious or depressed appearing.      Assessment    54 year old female,PMH includes anxiety, morbid obesity, polycystic kidney disease, HTN, GERD, high cholesterol, history of DVT right leg, history of a left knee injury presents with:  Ankle pain: Has ankle pain and some puffiness, suspect a sprain. We will get  x-ray Recommend conservative treatment with Ace wrap, ice, Tylenol, call if not better. No DVT on the left leg on clinical grounds at this point.   This visit occurred during the SARS-CoV-2 public health emergency.  Safety protocols were in place, including screening questions prior to the visit, additional usage of staff PPE, and extensive cleaning of exam room while observing appropriate contact time as indicated for disinfecting solutions.

## 2019-09-14 NOTE — Progress Notes (Signed)
Pre visit review using our clinic review tool, if applicable. No additional management support is needed unless otherwise documented below in the visit note. 

## 2019-09-14 NOTE — Patient Instructions (Signed)
Please get your x-ray at the first floor  Use Ace wrap around the ankle  Ice the area twice a day  You can take Tylenol 500 mg: 2 tablets 3 times a day as needed  Call if not gradually better  Call anytime if you have swelling or pain at your left calf.

## 2019-10-06 DIAGNOSIS — F4323 Adjustment disorder with mixed anxiety and depressed mood: Secondary | ICD-10-CM | POA: Diagnosis not present

## 2019-10-26 ENCOUNTER — Telehealth: Payer: Self-pay

## 2019-10-26 ENCOUNTER — Other Ambulatory Visit: Payer: Self-pay | Admitting: Family Medicine

## 2019-10-26 NOTE — Telephone Encounter (Signed)
PA initiated via Covermymeds; KEY: [id number]. PA approved.  [id number];Review Type:Prior Auth;Coverage Start Date:09/26/2019;Coverage End Date:10/25/2020;

## 2019-10-30 ENCOUNTER — Other Ambulatory Visit: Payer: Self-pay

## 2019-10-30 ENCOUNTER — Telehealth (INDEPENDENT_AMBULATORY_CARE_PROVIDER_SITE_OTHER): Payer: BC Managed Care – PPO | Admitting: Family Medicine

## 2019-10-30 VITALS — BP 131/84 | HR 59 | Temp 97.5°F

## 2019-10-30 DIAGNOSIS — E785 Hyperlipidemia, unspecified: Secondary | ICD-10-CM

## 2019-10-30 DIAGNOSIS — I10 Essential (primary) hypertension: Secondary | ICD-10-CM

## 2019-10-30 DIAGNOSIS — E559 Vitamin D deficiency, unspecified: Secondary | ICD-10-CM

## 2019-10-30 DIAGNOSIS — D751 Secondary polycythemia: Secondary | ICD-10-CM

## 2019-10-30 DIAGNOSIS — R739 Hyperglycemia, unspecified: Secondary | ICD-10-CM | POA: Diagnosis not present

## 2019-10-30 DIAGNOSIS — Z7189 Other specified counseling: Secondary | ICD-10-CM

## 2019-10-30 MED ORDER — FLUOXETINE HCL 20 MG PO TABS: 10.0000 mg | ORAL_TABLET | Freq: Every day | ORAL | 1 refills | Status: DC

## 2019-10-31 DIAGNOSIS — Z7189 Other specified counseling: Secondary | ICD-10-CM | POA: Insufficient documentation

## 2019-10-31 NOTE — Assessment & Plan Note (Signed)
Supplement and monitor 

## 2019-10-31 NOTE — Assessment & Plan Note (Signed)
Encouraged heart healthy diet, increase exercise, avoid trans fats, consider a krill oil cap daily 

## 2019-10-31 NOTE — Assessment & Plan Note (Signed)
She took her first moderna covid shot but had a severe enough reaction that she has been hesitant to take the second shot. Omron Blood Pressure cuff, upper arm, want BP 100-140/60-90 Pulse oximeter, want oxygen in 90s  Weekly vitals  Take Multivitamin with minerals, selenium Vitamin D 1000-2000 IU daily Probiotic with lactobacillus and bifidophilus Asprin EC 81 mg daily Fish oil Melatonin 2-5 mg at bedtime  [email or web address]

## 2019-10-31 NOTE — Assessment & Plan Note (Signed)
hgba1c acceptable, minimize simple carbs. Increase exercise as tolerated.  

## 2019-10-31 NOTE — Assessment & Plan Note (Signed)
Continue to monitor

## 2019-10-31 NOTE — Assessment & Plan Note (Signed)
Well controlled, no changes to meds. Encouraged heart healthy diet such as the DASH diet and exercise as tolerated.  °

## 2019-10-31 NOTE — Progress Notes (Signed)
Virtual Visit via phone Note  I connected with Kim Fox on 10/30/19 at  3:00 PM EDT by a phone enabled telemedicine application and verified that I am speaking with the correct person using two identifiers.  Location: Patient: home, patient in visit Provider: office, MD in visit   I discussed the limitations of evaluation and management by telemedicine and the availability of in person appointments. The patient expressed understanding and agreed to proceed. Kem Boroughs, CMA was able to get the patient set up in visit, phone after being unable to set up a video visit   Subjective:    Patient ID: Kim Fox, female    DOB: 1966-05-01, 54 y.o.   MRN: [id number]  Chief Complaint  Patient presents with  . 3 month follow up    HPI Patient is in today for follow up on chronic medical concerns. No recent febrile illness or hospitalizations. She had a bad reaction to her first COVID shot from [place] so she has chosen not to take the second one. She is also noting some swelling in her left ankle but no pain, redness or warmth. She is noting a rash on her left thigh and right leg that she thinks is poison ivy but it has improved to the point of being tolerable at this time. She is doing well on Fluoxetine at the reduced dose of 10 mg and does not feel she needs any further changes. Denies CP/palp/SOB/HA/congestion/fevers/GI or GU c/o. Taking meds as prescribed  Past Medical History:  Diagnosis Date  . Dyslipidemia 10/17/2017  . GERD (gastroesophageal reflux disease)   . Headache(784.0)    migraine  . History of chicken pox 04/10/2015  . Hypertension   . I 04/04/2014  . Menorrhagia   . Multiple renal cysts 08/09/2011  . Murmur, cardiac    Echo WNL 2009  . Obesity   . Polycystic kidney disease   . Preventative health care 04/20/2015  . Situational anxiety 10/17/2017  . Urinary incontinence 10/28/2015    Past Surgical History:  Procedure Laterality Date  .  ENDOMETRIAL BIOPSY  2010  . KIDNEY CYST REMOVAL  2002  . TONSILLECTOMY    . URETERAL STENT PLACEMENT  2002    Family History  Problem Relation Age of Onset  . Diabetes Mother        insulin dependent  . Hypertension Mother   . Migraines Mother   . Varicose Veins Mother   . Arthritis Mother   . Diabetes Father   . Hypertension Father   . Kidney disease Father   . Arthritis Father   . Migraines Sister   . Allergies Sister   . Renal Cyst Sister   . Cancer Sister        skin cancer  . Dysfunctional uterine bleeding Sister   . Cancer Paternal Grandmother        lung  . Cancer Paternal Grandfather        bladder, prostate  . Heart disease Paternal Grandfather   . Hyperlipidemia Daughter   . Fibromyalgia Sister   . Renal Cyst Sister   . Hypertension Sister   . Dysfunctional uterine bleeding Sister     Social History   Socioeconomic History  . Marital status: Divorced    Spouse name: Not on file  . Number of children: 2  . Years of education: college  . Highest education level: Not on file  Occupational History  . Occupation: Aeronautical engineer: [employer]  Tobacco Use  . Smoking status: Never Smoker  . Smokeless tobacco: Never Used  Substance and Sexual Activity  . Alcohol use: Yes    Comment: occ  . Drug use: No  . Sexual activity: Not on file  Other Topics Concern  . Not on file  Social History Narrative  . Not on file   Social Determinants of Health   Financial Resource Strain:   . Difficulty of Paying Living Expenses:   Food Insecurity:   . Worried About Charity fundraiser in the Last Year:   . Arboriculturist in the Last Year:   Transportation Needs:   . Film/video editor (Medical):   Marland Kitchen Lack of Transportation (Non-Medical):   Physical Activity:   . Days of Exercise per Week:   . Minutes of Exercise per Session:   Stress:   . Feeling of Stress :   Social Connections:   . Frequency of Communication with Friends and  Family:   . Frequency of Social Gatherings with Friends and Family:   . Attends Religious Services:   . Active Member of Clubs or Organizations:   . Attends Archivist Meetings:   Marland Kitchen Marital Status:   Intimate Partner Violence:   . Fear of Current or Ex-Partner:   . Emotionally Abused:   Marland Kitchen Physically Abused:   . Sexually Abused:     Outpatient Medications Prior to Visit  Medication Sig Dispense Refill  . Ascorbic Acid (VITAMIN C) 1000 MG tablet Take 1,000 mg by mouth daily.    Marland Kitchen aspirin EC 81 MG tablet Take 81 mg by mouth 2 (two) times daily.    Marland Kitchen atenolol (TENORMIN) 50 MG tablet TAKE 1 TABLET(50 MG) BY MOUTH DAILY 90 tablet 2  . Biotin 5000 MCG CAPS Take 1 capsule by mouth daily.    . Calcium Carbonate-Vitamin D (CALCIUM-VITAMIN D) 600-200 MG-UNIT CAPS Take 1 tablet by mouth 2 (two) times daily.    . famotidine (PEPCID) 40 MG tablet Take 0.5-1 tablets (20-40 mg total) by mouth at bedtime as needed for heartburn or indigestion. 90 tablet 2  . folic acid (FOLVITE) 562 MCG tablet Take 400 mcg by mouth daily.    Marland Kitchen L-Lysine 1000 MG TABS Take 1 tablet by mouth daily.    . Multiple Vitamin (MULTIVITAMIN) tablet Take 1 tablet by mouth daily.    . Omega 3-6-9 Fatty Acids (OMEGA 3-6-9 COMPLEX PO) Omega 3-6-9    . Probiotic Product (ACIDOPHILUS/GOAT MILK) CAPS Take by mouth.    . vitamin B-12 (CYANOCOBALAMIN) 500 MCG tablet Take 500 mcg by mouth daily.    Marland Kitchen VITAMIN D PO Take by mouth.    Marland Kitchen FLUoxetine (PROZAC) 20 MG tablet TAKE 1 TABLET(20 MG) BY MOUTH DAILY 90 tablet 1  . multivitamin-lutein (OCUVITE-LUTEIN) CAPS capsule Take 1 capsule by mouth daily.    . SUMAtriptan (IMITREX) 100 MG tablet Take at the onset of HA.  May repeat in 2 hours if needed.  Max dose 2/24 hours, 4/week (Patient not taking: Reported on 07/31/2019) 10 tablet 3   No facility-administered medications prior to visit.    Allergies  Allergen Reactions  . Nitrofurantoin Rash  . Macrodantin Rash  . Penicillins  Rash    Review of Systems  Constitutional: Negative for fever and malaise/fatigue.  HENT: Negative for congestion.   Eyes: Negative for blurred vision.  Respiratory: Negative for shortness of breath.   Cardiovascular: Negative for chest pain, palpitations and leg swelling.  Gastrointestinal: Negative for abdominal pain, blood in stool and nausea.  Genitourinary: Negative for dysuria and frequency.  Musculoskeletal: Negative for falls.  Skin: Positive for itching and rash.  Neurological: Negative for dizziness, loss of consciousness and headaches.  Endo/Heme/Allergies: Negative for environmental allergies.  Psychiatric/Behavioral: Negative for depression. The patient is not nervous/anxious.        Objective:    Physical Exam unable to obtain via phone visit.   BP 131/84   Pulse (!) 59   Temp (!) 97.5 F (36.4 C)   SpO2 96%  Wt Readings from Last 3 Encounters:  09/14/19 282 lb 6 oz (128.1 kg)  07/31/19 287 lb (130.2 kg)  03/20/19 270 lb (122.5 kg)    Diabetic Foot Exam - Simple   No data filed     Lab Results  Component Value Date   WBC 9.0 07/31/2019   HGB 15.4 (H) 07/31/2019   HCT 46.9 (H) 07/31/2019   PLT 247.0 07/31/2019   GLUCOSE 102 (H) 07/31/2019   CHOL 186 07/31/2019   TRIG 155.0 (H) 07/31/2019   HDL 50.40 07/31/2019   LDLDIRECT 117.0 07/27/2018   LDLCALC 105 (H) 07/31/2019   ALT 18 07/31/2019   AST 19 07/31/2019   NA 140 07/31/2019   K 4.8 07/31/2019   CL 104 07/31/2019   CREATININE 0.82 07/31/2019   BUN 19 07/31/2019   CO2 31 07/31/2019   TSH 1.28 07/31/2019   INR 1.03 03/25/2014   HGBA1C 6.1 07/31/2019   MICROALBUR 1.0 10/17/2017    Lab Results  Component Value Date   TSH 1.28 07/31/2019   Lab Results  Component Value Date   WBC 9.0 07/31/2019   HGB 15.4 (H) 07/31/2019   HCT 46.9 (H) 07/31/2019   MCV 88.3 07/31/2019   PLT 247.0 07/31/2019   Lab Results  Component Value Date   NA 140 07/31/2019   K 4.8 07/31/2019   CHLORIDE 108  10/07/2016   CO2 31 07/31/2019   GLUCOSE 102 (H) 07/31/2019   BUN 19 07/31/2019   CREATININE 0.82 07/31/2019   BILITOT 0.6 07/31/2019   ALKPHOS 81 07/31/2019   AST 19 07/31/2019   ALT 18 07/31/2019   PROT 7.3 07/31/2019   ALBUMIN 3.8 07/31/2019   CALCIUM 9.7 07/31/2019   ANIONGAP 8 10/07/2016   EGFR 80 (L) 10/07/2016   GFR 73.15 07/31/2019   Lab Results  Component Value Date   CHOL 186 07/31/2019   Lab Results  Component Value Date   HDL 50.40 07/31/2019   Lab Results  Component Value Date   LDLCALC 105 (H) 07/31/2019   Lab Results  Component Value Date   TRIG 155.0 (H) 07/31/2019   Lab Results  Component Value Date   CHOLHDL 4 07/31/2019   Lab Results  Component Value Date   HGBA1C 6.1 07/31/2019       Assessment & Plan:   Problem List Items Addressed This Visit    Essential hypertension, benign - Primary    Well controlled, no changes to meds. Encouraged heart healthy diet such as the DASH diet and exercise as tolerated.       Relevant Orders   CBC   Comprehensive metabolic panel   TSH   Dyslipidemia    Encouraged heart healthy diet, increase exercise, avoid trans fats, consider a krill oil cap daily      Relevant Orders   Lipid panel   Hyperglycemia    hgba1c acceptable, minimize simple carbs. Increase exercise as tolerated.  Relevant Orders   Hemoglobin A1c   Polycythemia    Continue to monitor      Vitamin D deficiency    Supplement and monitor      Relevant Orders   VITAMIN D 25 Hydroxy (Vit-D Deficiency, Fractures)   Educated about COVID-19 virus infection    She took her first moderna covid shot but had a severe enough reaction that she has been hesitant to take the second shot. Omron Blood Pressure cuff, upper arm, want BP 100-140/60-90 Pulse oximeter, want oxygen in 90s  Weekly vitals  Take Multivitamin with minerals, selenium Vitamin D 1000-2000 IU daily Probiotic with lactobacillus and bifidophilus Asprin EC 81 mg  daily Fish oil Melatonin 2-5 mg at bedtime  [email or web address]         I have discontinued Kim Fox "Cathy"'s SUMAtriptan and multivitamin-lutein. I have also changed her FLUoxetine. Additionally, I am having her maintain her L-Lysine, Biotin, multivitamin, Calcium-Vitamin D, folic acid, Acidophilus/Goat Milk, aspirin EC, atenolol, famotidine, vitamin C, vitamin B-12, Omega 3-6-9 Fatty Acids (OMEGA 3-6-9 COMPLEX PO), and VITAMIN D PO.  Meds ordered this encounter  Medications  . FLUoxetine (PROZAC) 20 MG tablet    Sig: Take 0.5 tablets (10 mg total) by mouth daily.    Dispense:  90 tablet    Refill:  1     I discussed the assessment and treatment plan with the patient. The patient was provided an opportunity to ask questions and all were answered. The patient agreed with the plan and demonstrated an understanding of the instructions.   The patient was advised to call back or seek an in-person evaluation if the symptoms worsen or if the condition fails to improve as anticipated.  I provided 20 minutes of non-face-to-face time during this encounter.   Penni Homans, MD

## 2019-11-10 DIAGNOSIS — F4323 Adjustment disorder with mixed anxiety and depressed mood: Secondary | ICD-10-CM | POA: Diagnosis not present

## 2019-12-03 ENCOUNTER — Encounter: Payer: Self-pay | Admitting: Family Medicine

## 2019-12-06 ENCOUNTER — Other Ambulatory Visit: Payer: Self-pay | Admitting: Family Medicine

## 2019-12-06 DIAGNOSIS — I10 Essential (primary) hypertension: Secondary | ICD-10-CM

## 2019-12-08 DIAGNOSIS — F4323 Adjustment disorder with mixed anxiety and depressed mood: Secondary | ICD-10-CM | POA: Diagnosis not present

## 2020-01-17 ENCOUNTER — Encounter: Payer: Self-pay | Admitting: Family Medicine

## 2020-01-26 DIAGNOSIS — F4323 Adjustment disorder with mixed anxiety and depressed mood: Secondary | ICD-10-CM | POA: Diagnosis not present

## 2020-02-13 ENCOUNTER — Other Ambulatory Visit: Payer: Self-pay

## 2020-02-13 ENCOUNTER — Other Ambulatory Visit (INDEPENDENT_AMBULATORY_CARE_PROVIDER_SITE_OTHER): Payer: BC Managed Care – PPO

## 2020-02-13 DIAGNOSIS — E785 Hyperlipidemia, unspecified: Secondary | ICD-10-CM | POA: Diagnosis not present

## 2020-02-13 DIAGNOSIS — E559 Vitamin D deficiency, unspecified: Secondary | ICD-10-CM | POA: Diagnosis not present

## 2020-02-13 DIAGNOSIS — R739 Hyperglycemia, unspecified: Secondary | ICD-10-CM

## 2020-02-13 DIAGNOSIS — I10 Essential (primary) hypertension: Secondary | ICD-10-CM | POA: Diagnosis not present

## 2020-02-13 NOTE — Addendum Note (Signed)
Addended by: Kelle Darting A on: 02/13/2020 07:57 AM   Modules accepted: Orders

## 2020-02-14 LAB — COMPREHENSIVE METABOLIC PANEL
AG Ratio: 1.2 (calc) (ref 1.0–2.5)
ALT: 18 U/L (ref 6–29)
AST: 22 U/L (ref 10–35)
Albumin: 3.9 g/dL (ref 3.6–5.1)
Alkaline phosphatase (APISO): 81 U/L (ref 37–153)
BUN: 23 mg/dL (ref 7–25)
CO2: 26 mmol/L (ref 20–32)
Calcium: 9.6 mg/dL (ref 8.6–10.4)
Chloride: 103 mmol/L (ref 98–110)
Creat: 0.98 mg/dL (ref 0.50–1.05)
Globulin: 3.2 g/dL (calc) (ref 1.9–3.7)
Glucose, Bld: 111 mg/dL — ABNORMAL HIGH (ref 65–99)
Potassium: 4.6 mmol/L (ref 3.5–5.3)
Sodium: 139 mmol/L (ref 135–146)
Total Bilirubin: 0.5 mg/dL (ref 0.2–1.2)
Total Protein: 7.1 g/dL (ref 6.1–8.1)

## 2020-02-14 LAB — LIPID PANEL
Cholesterol: 213 mg/dL — ABNORMAL HIGH (ref <200)
HDL: 53 mg/dL (ref >=50)
LDL Cholesterol (Calc): 133 mg/dL (calc) — ABNORMAL HIGH
Non-HDL Cholesterol (Calc): 160 mg/dL (calc) — ABNORMAL HIGH (ref <130)
Total CHOL/HDL Ratio: 4 (calc) (ref <5.0)
Triglycerides: 141 mg/dL (ref <150)

## 2020-02-14 LAB — CBC
HCT: 47.8 % — ABNORMAL HIGH (ref 35.0–45.0)
Hemoglobin: 15.8 g/dL — ABNORMAL HIGH (ref 11.7–15.5)
MCH: 29.4 pg (ref 27.0–33.0)
MCHC: 33.1 g/dL (ref 32.0–36.0)
MCV: 88.8 fL (ref 80.0–100.0)
MPV: 11.5 fL (ref 7.5–12.5)
Platelets: 277 10*3/uL (ref 140–400)
RBC: 5.38 10*6/uL — ABNORMAL HIGH (ref 3.80–5.10)
RDW: 13.1 % (ref 11.0–15.0)
WBC: 8.6 10*3/uL (ref 3.8–10.8)

## 2020-02-14 LAB — HEMOGLOBIN A1C
Hgb A1c MFr Bld: 5.8 % of total Hgb — ABNORMAL HIGH (ref <5.7)
Mean Plasma Glucose: 120 (calc)
eAG (mmol/L): 6.6 (calc)

## 2020-02-14 LAB — VITAMIN D 25 HYDROXY (VIT D DEFICIENCY, FRACTURES): Vit D, 25-Hydroxy: 32 ng/mL (ref 30–100)

## 2020-02-14 LAB — TSH: TSH: 1.43 mIU/L

## 2020-02-28 ENCOUNTER — Ambulatory Visit (HOSPITAL_BASED_OUTPATIENT_CLINIC_OR_DEPARTMENT_OTHER)
Admission: RE | Admit: 2020-02-28 | Discharge: 2020-02-28 | Disposition: A | Discharge status: Home / Self Care | Payer: BC Managed Care – PPO | Source: Ambulatory Visit | Attending: Family Medicine | Admitting: Family Medicine

## 2020-02-28 ENCOUNTER — Ambulatory Visit (INDEPENDENT_AMBULATORY_CARE_PROVIDER_SITE_OTHER): Payer: BC Managed Care – PPO | Admitting: Family Medicine

## 2020-02-28 ENCOUNTER — Encounter: Payer: Self-pay | Admitting: Family Medicine

## 2020-02-28 ENCOUNTER — Other Ambulatory Visit: Payer: Self-pay

## 2020-02-28 VITALS — BP 128/80 | HR 65 | Temp 97.6°F | Resp 13 | Ht 68.0 in | Wt 293.0 lb

## 2020-02-28 DIAGNOSIS — R739 Hyperglycemia, unspecified: Secondary | ICD-10-CM

## 2020-02-28 DIAGNOSIS — M25562 Pain in left knee: Secondary | ICD-10-CM | POA: Diagnosis not present

## 2020-02-28 DIAGNOSIS — Z0000 Encounter for general adult medical examination without abnormal findings: Secondary | ICD-10-CM

## 2020-02-28 DIAGNOSIS — E559 Vitamin D deficiency, unspecified: Secondary | ICD-10-CM

## 2020-02-28 DIAGNOSIS — I10 Essential (primary) hypertension: Secondary | ICD-10-CM

## 2020-02-28 DIAGNOSIS — E785 Hyperlipidemia, unspecified: Secondary | ICD-10-CM | POA: Diagnosis not present

## 2020-02-28 DIAGNOSIS — E6609 Other obesity due to excess calories: Secondary | ICD-10-CM

## 2020-02-28 DIAGNOSIS — G8929 Other chronic pain: Secondary | ICD-10-CM

## 2020-02-28 DIAGNOSIS — Z1239 Encounter for other screening for malignant neoplasm of breast: Secondary | ICD-10-CM

## 2020-02-28 NOTE — Assessment & Plan Note (Signed)
Well controlled, no changes to meds. Encouraged heart healthy diet such as the DASH diet and exercise as tolerated.  °

## 2020-02-28 NOTE — Assessment & Plan Note (Addendum)
Encouraged DASH diet, decrease po intake and increase exercise as tolerated. Needs 7-8 hours of sleep nightly. Avoid trans fats, eat small, frequent meals every 4-5 hours with lean proteins, complex carbs and healthy fats. Minimize simple carbs consider NOOM APP or MIND diet

## 2020-02-28 NOTE — Assessment & Plan Note (Addendum)
Encouraged heart healthy diet, increase exercise, avoid trans fats, consider a krill oil cap daily. She knows she is eating badly, she is going to try again to eat better

## 2020-02-28 NOTE — Assessment & Plan Note (Signed)
2 days ago she slipped in the kitchen and twisted her knee and it is swollen and painful. She has it wrapped and is stable to walk on it despite pain

## 2020-02-28 NOTE — Assessment & Plan Note (Addendum)
Patient encouraged to maintain heart healthy diet, regular exercise, adequate sleep. Consider daily probiotics. Take medications as prescribed. Labs reviewed. Pap negative 2018, negative HPV. MGM ordered and pap deferred

## 2020-02-28 NOTE — Patient Instructions (Signed)
Preventive Care 40-53 Years Old, Female Preventive care refers to visits with your health care provider and lifestyle choices that can promote health and wellness. This includes:  A yearly physical exam. This may also be called an annual well check.  Regular dental visits and eye exams.  Immunizations.  Screening for certain conditions.  Healthy lifestyle choices, such as eating a healthy diet, getting regular exercise, not using drugs or products that contain nicotine and tobacco, and limiting alcohol use. What can I expect for my preventive care visit? Physical exam Your health care provider will check your:  Height and weight. This may be used to calculate body mass index (BMI), which tells if you are at a healthy weight.  Heart rate and blood pressure.  Skin for abnormal spots. Counseling Your health care provider may ask you questions about your:  Alcohol, tobacco, and drug use.  Emotional well-being.  Home and relationship well-being.  Sexual activity.  Eating habits.  Work and work environment.  Method of birth control.  Menstrual cycle.  Pregnancy history. What immunizations do I need?  Influenza (flu) vaccine  This is recommended every year. Tetanus, diphtheria, and pertussis (Tdap) vaccine  You may need a Td booster every 10 years. Varicella (chickenpox) vaccine  You may need this if you have not been vaccinated. Zoster (shingles) vaccine  You may need this after age 53. Measles, mumps, and rubella (MMR) vaccine  You may need at least one dose of MMR if you were born in 1957 or later. You may also need a second dose. Pneumococcal conjugate (PCV13) vaccine  You may need this if you have certain conditions and were not previously vaccinated. Pneumococcal polysaccharide (PPSV23) vaccine  You may need one or two doses if you smoke cigarettes or if you have certain conditions. Meningococcal conjugate (MenACWY) vaccine  You may need this if you  have certain conditions. Hepatitis A vaccine  You may need this if you have certain conditions or if you travel or work in places where you may be exposed to hepatitis A. Hepatitis B vaccine  You may need this if you have certain conditions or if you travel or work in places where you may be exposed to hepatitis B. Haemophilus influenzae type b (Hib) vaccine  You may need this if you have certain conditions. Human papillomavirus (HPV) vaccine  If recommended by your health care provider, you may need three doses over 6 months. You may receive vaccines as individual doses or as more than one vaccine together in one shot (combination vaccines). Talk with your health care provider about the risks and benefits of combination vaccines. What tests do I need? Blood tests  Lipid and cholesterol levels. These may be checked every 5 years, or more frequently if you are over 53 years old.  Hepatitis C test.  Hepatitis B test. Screening  Lung cancer screening. You may have this screening every year starting at age 53 if you have a 30-pack-year history of smoking and currently smoke or have quit within the past 15 years.  Colorectal cancer screening. All adults should have this screening starting at age 53 and continuing until age 75. Your health care provider may recommend screening at age 53 if you are at increased risk. You will have tests every 1-10 years, depending on your results and the type of screening test.  Diabetes screening. This is done by checking your blood sugar (glucose) after you have not eaten for a while (fasting). You may have this   done every 1-3 years.  Mammogram. This may be done every 1-2 years. Talk with your health care provider about when you should start having regular mammograms. This may depend on whether you have a family history of breast cancer.  BRCA-related cancer screening. This may be done if you have a family history of breast, ovarian, tubal, or peritoneal  cancers.  Pelvic exam and Pap test. This may be done every 3 years starting at age 53. Starting at age 53, this may be done every 5 years if you have a Pap test in combination with an HPV test. Other tests  Sexually transmitted disease (STD) testing.  Bone density scan. This is done to screen for osteoporosis. You may have this scan if you are at high risk for osteoporosis. Follow these instructions at home: Eating and drinking  Eat a diet that includes fresh fruits and vegetables, whole grains, lean protein, and low-fat dairy.  Take vitamin and mineral supplements as recommended by your health care provider.  Do not drink alcohol if: ? Your health care provider tells you not to drink. ? You are pregnant, may be pregnant, or are planning to become pregnant.  If you drink alcohol: ? Limit how much you have to 0-1 drink a day. ? Be aware of how much alcohol is in your drink. In the [place] one drink equals one 12 oz bottle of beer (355 mL), one 5 oz glass of wine (148 mL), or one 1 oz glass of hard liquor (44 mL). Lifestyle  Take daily care of your teeth and gums.  Stay active. Exercise for at least 30 minutes on 5 or more days each week.  Do not use any products that contain nicotine or tobacco, such as cigarettes, e-cigarettes, and chewing tobacco. If you need help quitting, ask your health care provider.  If you are sexually active, practice safe sex. Use a condom or other form of birth control (contraception) in order to prevent pregnancy and STIs (sexually transmitted infections).  If told by your health care provider, take low-dose aspirin daily starting at age 53. What's next?  Visit your health care provider once a year for a well check visit.  Ask your health care provider how often you should have your eyes and teeth checked.  Stay up to date on all vaccines. This information is not intended to replace advice given to you by your health care provider. Make sure you  discuss any questions you have with your health care provider. Document Revised: 02/16/2018 Document Reviewed: 02/16/2018 Elsevier Patient Education  2020 [employer].

## 2020-02-28 NOTE — Assessment & Plan Note (Signed)
hgba1c acceptable, minimize simple carbs. Increase exercise as tolerated.  

## 2020-02-28 NOTE — Assessment & Plan Note (Addendum)
Supplement and monitor, takes calcium with D daily and has a Multivitamin with vitamin D and will add 1000 IU daily

## 2020-03-01 DIAGNOSIS — F4323 Adjustment disorder with mixed anxiety and depressed mood: Secondary | ICD-10-CM | POA: Diagnosis not present

## 2020-03-03 NOTE — Progress Notes (Signed)
Subjective:    Patient ID: Kim Fox, female    DOB: Jan 31, 1966, 54 y.o.   MRN: [id number]  Chief Complaint  Patient presents with  . Annual Exam    pt. states possible left knee injury and concerns    HPI Patient is in today for annual preventative exam and follow up on chronic medical concerns. No recent febrile illness or hospitalizations. She is noting left knee pain flared since last week and she has been struggling with knee pain and an ACL tear and meniscal injury. She slipped and felt a pop she has been taking it easy and icing and that helps some. She notes some swelling in that ankle as well. Denies CP/palp/SOB/HA/congestion/fevers/GI or GU c/o. Taking meds as prescribed. Emotionally she is doing much better. Her relationship with her daughter has improved notably and they are seeing each other again.   Past Medical History:  Diagnosis Date  . Dyslipidemia 10/17/2017  . GERD (gastroesophageal reflux disease)   . Headache(784.0)    migraine  . History of chicken pox 04/10/2015  . Hypertension   . I 04/04/2014  . Menorrhagia   . Multiple renal cysts 08/09/2011  . Murmur, cardiac    Echo WNL 2009  . Obesity   . Polycystic kidney disease   . Preventative health care 04/20/2015  . Situational anxiety 10/17/2017  . Urinary incontinence 10/28/2015    Past Surgical History:  Procedure Laterality Date  . ENDOMETRIAL BIOPSY  2010  . KIDNEY CYST REMOVAL  2002  . TONSILLECTOMY    . URETERAL STENT PLACEMENT  2002    Family History  Problem Relation Age of Onset  . Diabetes Mother        insulin dependent  . Hypertension Mother   . Migraines Mother   . Varicose Veins Mother   . Arthritis Mother   . Diabetes Father   . Hypertension Father   . Kidney disease Father   . Arthritis Father   . Migraines Sister   . Allergies Sister   . Renal Cyst Sister   . Cancer Sister        skin cancer  . Dysfunctional uterine bleeding Sister   . Cancer Paternal Grandmother         lung  . Cancer Paternal Grandfather        bladder, prostate  . Heart disease Paternal Grandfather   . Hyperlipidemia Daughter   . Fibromyalgia Sister   . Renal Cyst Sister   . Hypertension Sister   . Dysfunctional uterine bleeding Sister     Social History   Socioeconomic History  . Marital status: Divorced    Spouse name: Not on file  . Number of children: 2  . Years of education: college  . Highest education level: Not on file  Occupational History  . Occupation: Aeronautical engineer: [employer]  Tobacco Use  . Smoking status: Never Smoker  . Smokeless tobacco: Never Used  Vaping Use  . Vaping Use: Never used  Substance and Sexual Activity  . Alcohol use: Yes    Comment: occ  . Drug use: No  . Sexual activity: Not on file  Other Topics Concern  . Not on file  Social History Narrative  . Not on file   Social Determinants of Health   Financial Resource Strain:   . Difficulty of Paying Living Expenses: Not on file  Food Insecurity:   . Worried About Charity fundraiser in  the Last Year: Not on file  . Ran Out of Food in the Last Year: Not on file  Transportation Needs:   . Lack of Transportation (Medical): Not on file  . Lack of Transportation (Non-Medical): Not on file  Physical Activity:   . Days of Exercise per Week: Not on file  . Minutes of Exercise per Session: Not on file  Stress:   . Feeling of Stress : Not on file  Social Connections:   . Frequency of Communication with Friends and Family: Not on file  . Frequency of Social Gatherings with Friends and Family: Not on file  . Attends Religious Services: Not on file  . Active Member of Clubs or Organizations: Not on file  . Attends Archivist Meetings: Not on file  . Marital Status: Not on file  Intimate Partner Violence:   . Fear of Current or Ex-Partner: Not on file  . Emotionally Abused: Not on file  . Physically Abused: Not on file  . Sexually Abused: Not  on file    Outpatient Medications Prior to Visit  Medication Sig Dispense Refill  . Ascorbic Acid (VITAMIN C) 1000 MG tablet Take 1,000 mg by mouth daily.    Marland Kitchen aspirin EC 81 MG tablet Take 81 mg by mouth 2 (two) times daily.    Marland Kitchen atenolol (TENORMIN) 50 MG tablet TAKE 1 TABLET(50 MG) BY MOUTH DAILY 90 tablet 2  . Biotin 5000 MCG CAPS Take 1 capsule by mouth daily.    . Calcium Carbonate-Vitamin D (CALCIUM-VITAMIN D) 600-200 MG-UNIT CAPS Take 1 tablet by mouth 2 (two) times daily.    . famotidine (PEPCID) 40 MG tablet Take 0.5-1 tablets (20-40 mg total) by mouth at bedtime as needed for heartburn or indigestion. 90 tablet 2  . FLUoxetine (PROZAC) 20 MG tablet Take 0.5 tablets (10 mg total) by mouth daily. 90 tablet 1  . folic acid (FOLVITE) 400 MCG tablet Take 400 mcg by mouth daily.    Marland Kitchen L-Lysine 1000 MG TABS Take 1 tablet by mouth daily.    . Multiple Vitamin (MULTIVITAMIN) tablet Take 1 tablet by mouth daily.    . Omega 3-6-9 Fatty Acids (OMEGA 3-6-9 COMPLEX PO) Omega 3-6-9    . Probiotic Product (ACIDOPHILUS/GOAT MILK) CAPS Take by mouth.    . vitamin B-12 (CYANOCOBALAMIN) 500 MCG tablet Take 500 mcg by mouth daily.    Marland Kitchen VITAMIN D PO Take by mouth.     No facility-administered medications prior to visit.    Allergies  Allergen Reactions  . Nitrofurantoin Rash  . Macrodantin Rash  . Penicillins Rash    Review of Systems  Constitutional: Negative for chills, fever and malaise/fatigue.  HENT: Negative for congestion and hearing loss.   Eyes: Negative for discharge.  Respiratory: Negative for cough, sputum production and shortness of breath.   Cardiovascular: Negative for chest pain, palpitations and leg swelling.  Gastrointestinal: Negative for abdominal pain, blood in stool, constipation, diarrhea, heartburn, nausea and vomiting.  Genitourinary: Negative for dysuria, frequency, hematuria and urgency.  Musculoskeletal: Positive for joint pain. Negative for back pain, falls and  myalgias.  Skin: Negative for rash.  Neurological: Negative for dizziness, sensory change, loss of consciousness, weakness and headaches.  Endo/Heme/Allergies: Negative for environmental allergies. Does not bruise/bleed easily.  Psychiatric/Behavioral: Negative for depression and suicidal ideas. The patient is not nervous/anxious and does not have insomnia.        Objective:    Physical Exam Constitutional:  General: She is not in acute distress.    Appearance: She is not diaphoretic.  HENT:     Head: Normocephalic and atraumatic.     Right Ear: External ear normal.     Left Ear: External ear normal.     Nose: Nose normal.     Mouth/Throat:     Pharynx: No oropharyngeal exudate.  Eyes:     General: No scleral icterus.       Right eye: No discharge.        Left eye: No discharge.     Conjunctiva/sclera: Conjunctivae normal.     Pupils: Pupils are equal, round, and reactive to light.  Neck:     Thyroid: No thyromegaly.  Cardiovascular:     Rate and Rhythm: Normal rate and regular rhythm.     Heart sounds: Normal heart sounds. No murmur heard.   Pulmonary:     Effort: Pulmonary effort is normal. No respiratory distress.     Breath sounds: Normal breath sounds. No wheezing or rales.  Abdominal:     General: Bowel sounds are normal. There is no distension.     Palpations: Abdomen is soft. There is no mass.     Tenderness: There is no abdominal tenderness.  Musculoskeletal:        General: No tenderness. Normal range of motion.     Cervical back: Normal range of motion and neck supple.  Lymphadenopathy:     Cervical: No cervical adenopathy.  Skin:    General: Skin is warm and dry.     Findings: No rash.  Neurological:     Mental Status: She is alert and oriented to person, place, and time.     Cranial Nerves: No cranial nerve deficit.     Coordination: Coordination normal.     Deep Tendon Reflexes: Reflexes are normal and symmetric. Reflexes normal.     BP 128/80  (BP Location: Left Arm, Patient Position: Sitting, Cuff Size: Large)   Pulse 65   Temp 97.6 F (36.4 C) (Oral)   Resp 13   Ht '5\' 8"'  (1.727 m)   Wt 293 lb (132.9 kg)   SpO2 98%   BMI 44.55 kg/m  Wt Readings from Last 3 Encounters:  02/28/20 293 lb (132.9 kg)  09/14/19 282 lb 6 oz (128.1 kg)  07/31/19 287 lb (130.2 kg)    Diabetic Foot Exam - Simple   No data filed     Lab Results  Component Value Date   WBC 8.6 02/13/2020   HGB 15.8 (H) 02/13/2020   HCT 47.8 (H) 02/13/2020   PLT 277 02/13/2020   GLUCOSE 111 (H) 02/13/2020   CHOL 213 (H) 02/13/2020   TRIG 141 02/13/2020   HDL 53 02/13/2020   LDLDIRECT 117.0 07/27/2018   LDLCALC 133 (H) 02/13/2020   ALT 18 02/13/2020   AST 22 02/13/2020   NA 139 02/13/2020   K 4.6 02/13/2020   CL 103 02/13/2020   CREATININE 0.98 02/13/2020   BUN 23 02/13/2020   CO2 26 02/13/2020   TSH 1.43 02/13/2020   INR 1.03 03/25/2014   HGBA1C 5.8 (H) 02/13/2020   MICROALBUR 1.0 10/17/2017    Lab Results  Component Value Date   TSH 1.43 02/13/2020   Lab Results  Component Value Date   WBC 8.6 02/13/2020   HGB 15.8 (H) 02/13/2020   HCT 47.8 (H) 02/13/2020   MCV 88.8 02/13/2020   PLT 277 02/13/2020   Lab Results  Component Value Date  NA 139 02/13/2020   K 4.6 02/13/2020   CHLORIDE 108 10/07/2016   CO2 26 02/13/2020   GLUCOSE 111 (H) 02/13/2020   BUN 23 02/13/2020   CREATININE 0.98 02/13/2020   BILITOT 0.5 02/13/2020   ALKPHOS 81 07/31/2019   AST 22 02/13/2020   ALT 18 02/13/2020   PROT 7.1 02/13/2020   ALBUMIN 3.8 07/31/2019   CALCIUM 9.6 02/13/2020   ANIONGAP 8 10/07/2016   EGFR 80 (L) 10/07/2016   GFR 73.15 07/31/2019   Lab Results  Component Value Date   CHOL 213 (H) 02/13/2020   Lab Results  Component Value Date   HDL 53 02/13/2020   Lab Results  Component Value Date   LDLCALC 133 (H) 02/13/2020   Lab Results  Component Value Date   TRIG 141 02/13/2020   Lab Results  Component Value Date   CHOLHDL  4.0 02/13/2020   Lab Results  Component Value Date   HGBA1C 5.8 (H) 02/13/2020       Assessment & Plan:   Problem List Items Addressed This Visit    Essential hypertension, benign    Well controlled, no changes to meds. Encouraged heart healthy diet such as the DASH diet and exercise as tolerated.       Relevant Orders   CBC with Differential/Platelet   Comprehensive metabolic panel   TSH   Obesity    Encouraged DASH diet, decrease po intake and increase exercise as tolerated. Needs 7-8 hours of sleep nightly. Avoid trans fats, eat small, frequent meals every 4-5 hours with lean proteins, complex carbs and healthy fats. Minimize simple carbs consider NOOM APP or MIND diet      Preventative health care    Patient encouraged to maintain heart healthy diet, regular exercise, adequate sleep. Consider daily probiotics. Take medications as prescribed. Labs reviewed. Pap negative 2018, negative HPV. MGM ordered and pap deferred      Dyslipidemia    Encouraged heart healthy diet, increase exercise, avoid trans fats, consider a krill oil cap daily. She knows she is eating badly, she is going to try again to eat better      Relevant Orders   Lipid panel   Hyperglycemia    hgba1c acceptable, minimize simple carbs. Increase exercise as tolerated.      Relevant Orders   Hemoglobin A1c   Vitamin D deficiency    Supplement and monitor, takes calcium with D daily and has a Multivitamin with vitamin D and will add 1000 IU daily      Relevant Orders   VITAMIN D 25 Hydroxy (Vit-D Deficiency, Fractures)   Left knee pain - Primary    2 days ago she slipped in the kitchen and twisted her knee and it is swollen and painful. She has it wrapped and is stable to walk on it despite pain      Relevant Orders   DG Knee Complete 4 Views Left (Completed)    Other Visit Diagnoses    Encounter for screening for malignant neoplasm of breast, unspecified screening modality       Relevant Orders    MM 3D SCREEN BREAST BILATERAL      I am having Kim Fox "Cathy" maintain her L-Lysine, Biotin, multivitamin, Calcium-Vitamin D, folic acid, Acidophilus/Goat Milk, aspirin EC, famotidine, vitamin C, vitamin B-12, Omega 3-6-9 Fatty Acids (OMEGA 3-6-9 COMPLEX PO), VITAMIN D PO, FLUoxetine, and atenolol.  No orders of the defined types were placed in this encounter.    Penni Homans, MD

## 2020-03-29 DIAGNOSIS — F4323 Adjustment disorder with mixed anxiety and depressed mood: Secondary | ICD-10-CM | POA: Diagnosis not present

## 2020-04-01 ENCOUNTER — Other Ambulatory Visit: Payer: Self-pay

## 2020-04-01 ENCOUNTER — Ambulatory Visit (HOSPITAL_BASED_OUTPATIENT_CLINIC_OR_DEPARTMENT_OTHER)
Admission: RE | Admit: 2020-04-01 | Discharge: 2020-04-01 | Disposition: A | Discharge status: Home / Self Care | Payer: BC Managed Care – PPO | Source: Ambulatory Visit | Attending: Family Medicine | Admitting: Family Medicine

## 2020-04-01 ENCOUNTER — Encounter (HOSPITAL_BASED_OUTPATIENT_CLINIC_OR_DEPARTMENT_OTHER): Payer: Self-pay

## 2020-04-01 DIAGNOSIS — Z1231 Encounter for screening mammogram for malignant neoplasm of breast: Secondary | ICD-10-CM | POA: Diagnosis not present

## 2020-04-01 DIAGNOSIS — Z1239 Encounter for other screening for malignant neoplasm of breast: Secondary | ICD-10-CM

## 2020-04-04 ENCOUNTER — Other Ambulatory Visit: Payer: Self-pay | Admitting: Family Medicine

## 2020-05-03 DIAGNOSIS — F4323 Adjustment disorder with mixed anxiety and depressed mood: Secondary | ICD-10-CM | POA: Diagnosis not present

## 2020-05-08 ENCOUNTER — Encounter: Payer: Self-pay | Admitting: Family Medicine

## 2020-05-24 DIAGNOSIS — F4323 Adjustment disorder with mixed anxiety and depressed mood: Secondary | ICD-10-CM | POA: Diagnosis not present

## 2020-06-25 ENCOUNTER — Other Ambulatory Visit: Payer: BC Managed Care – PPO

## 2020-06-28 DIAGNOSIS — F4323 Adjustment disorder with mixed anxiety and depressed mood: Secondary | ICD-10-CM | POA: Diagnosis not present

## 2020-07-01 ENCOUNTER — Telehealth: Payer: BC Managed Care – PPO | Admitting: Family Medicine

## 2020-07-26 DIAGNOSIS — F4323 Adjustment disorder with mixed anxiety and depressed mood: Secondary | ICD-10-CM | POA: Diagnosis not present

## 2020-07-28 ENCOUNTER — Other Ambulatory Visit: Payer: BC Managed Care – PPO

## 2020-08-11 ENCOUNTER — Other Ambulatory Visit: Payer: Self-pay

## 2020-08-11 ENCOUNTER — Other Ambulatory Visit (INDEPENDENT_AMBULATORY_CARE_PROVIDER_SITE_OTHER): Payer: BC Managed Care – PPO

## 2020-08-11 DIAGNOSIS — R739 Hyperglycemia, unspecified: Secondary | ICD-10-CM

## 2020-08-11 DIAGNOSIS — E559 Vitamin D deficiency, unspecified: Secondary | ICD-10-CM | POA: Diagnosis not present

## 2020-08-11 DIAGNOSIS — I10 Essential (primary) hypertension: Secondary | ICD-10-CM

## 2020-08-11 DIAGNOSIS — E785 Hyperlipidemia, unspecified: Secondary | ICD-10-CM | POA: Diagnosis not present

## 2020-08-11 LAB — LIPID PANEL
Cholesterol: 176 mg/dL (ref 0–200)
HDL: 49.9 mg/dL (ref >39.00)
LDL Cholesterol: 94 mg/dL (ref 0–99)
NonHDL: 126.33
Total CHOL/HDL Ratio: 4
Triglycerides: 162 mg/dL — ABNORMAL HIGH (ref 0.0–149.0)
VLDL: 32.4 mg/dL (ref 0.0–40.0)

## 2020-08-11 LAB — HEMOGLOBIN A1C: Hgb A1c MFr Bld: 6.1 % (ref 4.6–6.5)

## 2020-08-11 LAB — COMPREHENSIVE METABOLIC PANEL
ALT: 17 U/L (ref 0–35)
AST: 19 U/L (ref 0–37)
Albumin: 3.7 g/dL (ref 3.5–5.2)
Alkaline Phosphatase: 87 U/L (ref 39–117)
BUN: 16 mg/dL (ref 6–23)
CO2: 29 mEq/L (ref 19–32)
Calcium: 9.2 mg/dL (ref 8.4–10.5)
Chloride: 101 mEq/L (ref 96–112)
Creatinine, Ser: 0.82 mg/dL (ref 0.40–1.20)
GFR: 81.77 mL/min (ref >60.00)
Glucose, Bld: 103 mg/dL — ABNORMAL HIGH (ref 70–99)
Potassium: 4.2 mEq/L (ref 3.5–5.1)
Sodium: 138 mEq/L (ref 135–145)
Total Bilirubin: 0.4 mg/dL (ref 0.2–1.2)
Total Protein: 7 g/dL (ref 6.0–8.3)

## 2020-08-11 LAB — CBC WITH DIFFERENTIAL/PLATELET
Basophils Absolute: 0.1 10*3/uL (ref 0.0–0.1)
Basophils Relative: 1 % (ref 0.0–3.0)
Eosinophils Absolute: 0.2 10*3/uL (ref 0.0–0.7)
Eosinophils Relative: 2.2 % (ref 0.0–5.0)
HCT: 46.8 % — ABNORMAL HIGH (ref 36.0–46.0)
Hemoglobin: 15.3 g/dL — ABNORMAL HIGH (ref 12.0–15.0)
Lymphocytes Relative: 39.7 % (ref 12.0–46.0)
Lymphs Abs: 3.2 10*3/uL (ref 0.7–4.0)
MCHC: 32.8 g/dL (ref 30.0–36.0)
MCV: 87.6 fl (ref 78.0–100.0)
Monocytes Absolute: 0.7 10*3/uL (ref 0.1–1.0)
Monocytes Relative: 8.5 % (ref 3.0–12.0)
Neutro Abs: 3.9 10*3/uL (ref 1.4–7.7)
Neutrophils Relative %: 48.6 % (ref 43.0–77.0)
Platelets: 234 10*3/uL (ref 150.0–400.0)
RBC: 5.34 Mil/uL — ABNORMAL HIGH (ref 3.87–5.11)
RDW: 14.2 % (ref 11.5–15.5)
WBC: 8 10*3/uL (ref 4.0–10.5)

## 2020-08-11 LAB — TSH: TSH: 1.14 u[IU]/mL (ref 0.35–4.50)

## 2020-08-11 NOTE — Addendum Note (Signed)
Addended by: Manuela Schwartz on: 08/11/2020 08:28 AM   Modules accepted: Orders

## 2020-08-11 NOTE — Addendum Note (Signed)
Addended by: Manuela Schwartz on: 08/11/2020 08:27 AM   Modules accepted: Orders

## 2020-08-12 LAB — VITAMIN D 25 HYDROXY (VIT D DEFICIENCY, FRACTURES): Vit D, 25-Hydroxy: 39 ng/mL (ref 30–100)

## 2020-08-18 ENCOUNTER — Telehealth: Payer: BC Managed Care – PPO | Admitting: Family Medicine

## 2020-08-22 ENCOUNTER — Other Ambulatory Visit: Payer: Self-pay

## 2020-08-22 ENCOUNTER — Telehealth (INDEPENDENT_AMBULATORY_CARE_PROVIDER_SITE_OTHER): Payer: BC Managed Care – PPO | Admitting: Family Medicine

## 2020-08-22 DIAGNOSIS — E785 Hyperlipidemia, unspecified: Secondary | ICD-10-CM

## 2020-08-22 DIAGNOSIS — N938 Other specified abnormal uterine and vaginal bleeding: Secondary | ICD-10-CM

## 2020-08-22 DIAGNOSIS — R739 Hyperglycemia, unspecified: Secondary | ICD-10-CM

## 2020-08-22 DIAGNOSIS — I10 Essential (primary) hypertension: Secondary | ICD-10-CM | POA: Diagnosis not present

## 2020-08-22 DIAGNOSIS — E559 Vitamin D deficiency, unspecified: Secondary | ICD-10-CM

## 2020-08-22 DIAGNOSIS — M79672 Pain in left foot: Secondary | ICD-10-CM

## 2020-08-22 DIAGNOSIS — M79671 Pain in right foot: Secondary | ICD-10-CM

## 2020-08-22 MED ORDER — LIDOCAINE HCL 4 % EX CREA: 1.0000 | TOPICAL_CREAM | Freq: Two times a day (BID) | CUTANEOUS | 11 refills | Status: AC | PRN

## 2020-08-24 NOTE — Assessment & Plan Note (Signed)
Supplement and monitor 

## 2020-08-24 NOTE — Assessment & Plan Note (Signed)
Encouraged moist heat and gentle stretching as tolerated. May try NSAIDs and prescription meds as directed and report if symptoms worsen or seek immediate care. Given a refill on Aspercreme with Lidocaine to use topically

## 2020-08-24 NOTE — Progress Notes (Signed)
Patient ID: Kim Fox, female   DOB: 1965/08/30, 55 y.o.   MRN: [id number] Virtual Visit via Video Note  I connected with Kim Fox on 08/22/20 at  8:40 AM EST by a video enabled telemedicine application and verified that I am speaking with the correct person using two identifiers.  Location: Patient: home, patient and provider in visit Provider: home   I discussed the limitations of evaluation and management by telemedicine and the availability of in person appointments. The patient expressed understanding and agreed to proceed. S Chism, CMA was able to get the patient set up on a video visit   Subjective:    Patient ID: Kim Fox, female    DOB: Oct 28, 1965, 55 y.o.   MRN: [id number]  Chief Complaint  Patient presents with  . Follow-up    HPI Patient is in today for follow up on chronic medical concerns. No recent febrile illness or hospitalizations. No acute concerns but she does continue to have trouble with bilateral foot and knee pain. She notes the majority of the pain is in her arches and she does get some relief with topical lidocaine. Denies CP/palp/SOB/HA/congestion/fevers/GI or GU c/o. Taking meds as prescribed  Past Medical History:  Diagnosis Date  . Dyslipidemia 10/17/2017  . GERD (gastroesophageal reflux disease)   . Headache(784.0)    migraine  . History of chicken pox 04/10/2015  . Hypertension   . I 04/04/2014  . Menorrhagia   . Multiple renal cysts 08/09/2011  . Murmur, cardiac    Echo WNL 2009  . Obesity   . Polycystic kidney disease   . Preventative health care 04/20/2015  . Situational anxiety 10/17/2017  . Urinary incontinence 10/28/2015    Past Surgical History:  Procedure Laterality Date  . ENDOMETRIAL BIOPSY  2010  . KIDNEY CYST REMOVAL  2002  . TONSILLECTOMY    . URETERAL STENT PLACEMENT  2002    Family History  Problem Relation Age of Onset  . Diabetes Mother        insulin dependent  . Hypertension Mother   .  Migraines Mother   . Varicose Veins Mother   . Arthritis Mother   . Diabetes Father   . Hypertension Father   . Kidney disease Father   . Arthritis Father   . Migraines Sister   . Allergies Sister   . Renal Cyst Sister   . Cancer Sister        skin cancer  . Dysfunctional uterine bleeding Sister   . Cancer Paternal Grandmother        lung  . Cancer Paternal Grandfather        bladder, prostate  . Heart disease Paternal Grandfather   . Hyperlipidemia Daughter   . Fibromyalgia Sister   . Renal Cyst Sister   . Hypertension Sister   . Dysfunctional uterine bleeding Sister     Social History   Socioeconomic History  . Marital status: Divorced    Spouse name: Not on file  . Number of children: 2  . Years of education: college  . Highest education level: Not on file  Occupational History  . Occupation: Aeronautical engineer: [employer]  Tobacco Use  . Smoking status: Never Smoker  . Smokeless tobacco: Never Used  Vaping Use  . Vaping Use: Never used  Substance and Sexual Activity  . Alcohol use: Yes    Comment: occ  . Drug use: No  . Sexual activity: Not on  file  Other Topics Concern  . Not on file  Social History Narrative  . Not on file   Social Determinants of Health   Financial Resource Strain: Not on file  Food Insecurity: Not on file  Transportation Needs: Not on file  Physical Activity: Not on file  Stress: Not on file  Social Connections: Not on file  Intimate Partner Violence: Not on file    Outpatient Medications Prior to Visit  Medication Sig Dispense Refill  . Ascorbic Acid (VITAMIN C) 1000 MG tablet Take 1,000 mg by mouth daily.    Marland Kitchen aspirin EC 81 MG tablet Take 81 mg by mouth 2 (two) times daily.    Marland Kitchen atenolol (TENORMIN) 50 MG tablet TAKE 1 TABLET(50 MG) BY MOUTH DAILY 90 tablet 2  . Biotin 5000 MCG CAPS Take 1 capsule by mouth daily.    . Calcium Carbonate-Vitamin D (CALCIUM-VITAMIN D) 600-200 MG-UNIT CAPS Take 1 tablet  by mouth 2 (two) times daily.    . famotidine (PEPCID) 40 MG tablet TAKE 1/2 TO 1 TABLET BY MOUTH EVERY NIGHT AT BEDTIME AS NEEDED FOR HEARTBURN OR INDIGESTION 90 tablet 2  . FLUoxetine (PROZAC) 20 MG tablet Take 0.5 tablets (10 mg total) by mouth daily. 90 tablet 1  . folic acid (FOLVITE) 196 MCG tablet Take 400 mcg by mouth daily.    Marland Kitchen L-Lysine 1000 MG TABS Take 1 tablet by mouth daily.    . Multiple Vitamin (MULTIVITAMIN) tablet Take 1 tablet by mouth daily.    . Omega 3-6-9 Fatty Acids (OMEGA 3-6-9 COMPLEX PO) Omega 3-6-9    . Probiotic Product (ACIDOPHILUS/GOAT MILK) CAPS Take by mouth.    . vitamin B-12 (CYANOCOBALAMIN) 500 MCG tablet Take 500 mcg by mouth daily.    Marland Kitchen VITAMIN D PO Take by mouth.     No facility-administered medications prior to visit.    Allergies  Allergen Reactions  . Nitrofurantoin Rash  . Macrodantin Rash  . Penicillins Rash    Review of Systems  Constitutional: Negative for fever and malaise/fatigue.  HENT: Negative for congestion.   Eyes: Negative for blurred vision.  Respiratory: Negative for shortness of breath.   Cardiovascular: Negative for chest pain, palpitations and leg swelling.  Gastrointestinal: Negative for abdominal pain, blood in stool and nausea.  Genitourinary: Negative for dysuria and frequency.  Musculoskeletal: Positive for joint pain. Negative for falls.  Skin: Negative for rash.  Neurological: Negative for dizziness, loss of consciousness and headaches.  Endo/Heme/Allergies: Negative for environmental allergies.  Psychiatric/Behavioral: Negative for depression. The patient is not nervous/anxious.        Objective:    Physical Exam Constitutional:      Appearance: Normal appearance. She is not ill-appearing.  HENT:     Head: Normocephalic and atraumatic.     Right Ear: External ear normal.     Left Ear: External ear normal.     Nose: Nose normal.  Eyes:     General:        Right eye: No discharge.        Left eye: No  discharge.  Pulmonary:     Effort: Pulmonary effort is normal.  Neurological:     Mental Status: She is alert and oriented to person, place, and time.  Psychiatric:        Behavior: Behavior normal.     There were no vitals taken for this visit. Wt Readings from Last 3 Encounters:  02/28/20 293 lb (132.9 kg)  09/14/19 282 lb  6 oz (128.1 kg)  07/31/19 287 lb (130.2 kg)    Diabetic Foot Exam - Simple   No data filed    Lab Results  Component Value Date   WBC 8.0 08/11/2020   HGB 15.3 (H) 08/11/2020   HCT 46.8 (H) 08/11/2020   PLT 234.0 08/11/2020   GLUCOSE 103 (H) 08/11/2020   CHOL 176 08/11/2020   TRIG 162.0 (H) 08/11/2020   HDL 49.90 08/11/2020   LDLDIRECT 117.0 07/27/2018   LDLCALC 94 08/11/2020   ALT 17 08/11/2020   AST 19 08/11/2020   NA 138 08/11/2020   K 4.2 08/11/2020   CL 101 08/11/2020   CREATININE 0.82 08/11/2020   BUN 16 08/11/2020   CO2 29 08/11/2020   TSH 1.14 08/11/2020   INR 1.03 03/25/2014   HGBA1C 6.1 08/11/2020   MICROALBUR 1.0 10/17/2017    Lab Results  Component Value Date   TSH 1.14 08/11/2020   Lab Results  Component Value Date   WBC 8.0 08/11/2020   HGB 15.3 (H) 08/11/2020   HCT 46.8 (H) 08/11/2020   MCV 87.6 08/11/2020   PLT 234.0 08/11/2020   Lab Results  Component Value Date   NA 138 08/11/2020   K 4.2 08/11/2020   CHLORIDE 108 10/07/2016   CO2 29 08/11/2020   GLUCOSE 103 (H) 08/11/2020   BUN 16 08/11/2020   CREATININE 0.82 08/11/2020   BILITOT 0.4 08/11/2020   ALKPHOS 87 08/11/2020   AST 19 08/11/2020   ALT 17 08/11/2020   PROT 7.0 08/11/2020   ALBUMIN 3.7 08/11/2020   CALCIUM 9.2 08/11/2020   ANIONGAP 8 10/07/2016   EGFR 80 (L) 10/07/2016   GFR 81.77 08/11/2020   Lab Results  Component Value Date   CHOL 176 08/11/2020   Lab Results  Component Value Date   HDL 49.90 08/11/2020   Lab Results  Component Value Date   LDLCALC 94 08/11/2020   Lab Results  Component Value Date   TRIG 162.0 (H) 08/11/2020    Lab Results  Component Value Date   CHOLHDL 4 08/11/2020   Lab Results  Component Value Date   HGBA1C 6.1 08/11/2020       Assessment & Plan:   Problem List Items Addressed This Visit    Essential hypertension, benign - Primary    Monitor and report any concerns, no changes to meds. Encouraged heart healthy diet such as the DASH diet and exercise as tolerated.       Relevant Orders   CBC   Comprehensive metabolic panel   TSH   DUB (dysfunctional uterine bleeding)    She has not had any further episodes of bleeding but she is encouraged to follow up with OB/GYN      Dyslipidemia    Encouraged heart healthy diet, increase exercise, avoid trans fats, consider a krill oil cap daily      Relevant Orders   Lipid panel   Hyperglycemia    hgba1c acceptable, minimize simple carbs. Increase exercise as tolerated.       Relevant Orders   Hemoglobin A1c   Foot pain, bilateral    Encouraged moist heat and gentle stretching as tolerated. May try NSAIDs and prescription meds as directed and report if symptoms worsen or seek immediate care. Given a refill on Aspercreme with Lidocaine to use topically      Vitamin D deficiency    Supplement and monitor         I am having Kim Fox "[place]"  start on Lidocaine HCl. I am also having her maintain her L-Lysine, Biotin, multivitamin, Calcium-Vitamin D, folic acid, Acidophilus/Goat Milk, aspirin EC, vitamin C, vitamin B-12, Omega 3-6-9 Fatty Acids (OMEGA 3-6-9 COMPLEX PO), VITAMIN D PO, FLUoxetine, atenolol, and famotidine.  Meds ordered this encounter  Medications  . Lidocaine HCl (ASPERCREME LIDOCAINE) 4 % CREA    Sig: Apply 1 Dose topically 2 (two) times daily as needed.    Dispense:  133 g    Refill:  11     I discussed the assessment and treatment plan with the patient. The patient was provided an opportunity to ask questions and all were answered. The patient agreed with the plan and demonstrated an understanding  of the instructions.   The patient was advised to call back or seek an in-person evaluation if the symptoms worsen or if the condition fails to improve as anticipated.  I provided 20 minutes of non-face-to-face time during this encounter.   Penni Homans, MD

## 2020-08-24 NOTE — Assessment & Plan Note (Signed)
She has not had any further episodes of bleeding but she is encouraged to follow up with OB/GYN

## 2020-08-24 NOTE — Assessment & Plan Note (Signed)
Monitor and report any concerns, no changes to meds. Encouraged heart healthy diet such as the DASH diet and exercise as tolerated.  ?

## 2020-08-24 NOTE — Assessment & Plan Note (Signed)
Encouraged heart healthy diet, increase exercise, avoid trans fats, consider a krill oil cap daily 

## 2020-08-24 NOTE — Assessment & Plan Note (Signed)
hgba1c acceptable, minimize simple carbs. Increase exercise as tolerated.  

## 2020-09-03 ENCOUNTER — Other Ambulatory Visit: Payer: Self-pay | Admitting: Family Medicine

## 2020-09-03 DIAGNOSIS — I10 Essential (primary) hypertension: Secondary | ICD-10-CM

## 2020-10-18 DIAGNOSIS — F4323 Adjustment disorder with mixed anxiety and depressed mood: Secondary | ICD-10-CM | POA: Diagnosis not present

## 2020-10-23 ENCOUNTER — Other Ambulatory Visit: Payer: Self-pay | Admitting: Family Medicine

## 2020-11-11 DIAGNOSIS — F4323 Adjustment disorder with mixed anxiety and depressed mood: Secondary | ICD-10-CM | POA: Diagnosis not present

## 2020-12-11 DIAGNOSIS — F4323 Adjustment disorder with mixed anxiety and depressed mood: Secondary | ICD-10-CM | POA: Diagnosis not present

## 2020-12-18 ENCOUNTER — Encounter: Payer: Self-pay | Admitting: Family Medicine

## 2020-12-18 NOTE — Telephone Encounter (Signed)
Patient has been advised to schedule office visit.

## 2020-12-24 ENCOUNTER — Other Ambulatory Visit: Payer: Self-pay

## 2020-12-24 ENCOUNTER — Telehealth (INDEPENDENT_AMBULATORY_CARE_PROVIDER_SITE_OTHER): Payer: BC Managed Care – PPO | Admitting: Family Medicine

## 2020-12-24 DIAGNOSIS — M79602 Pain in left arm: Secondary | ICD-10-CM

## 2020-12-24 DIAGNOSIS — M79601 Pain in right arm: Secondary | ICD-10-CM

## 2020-12-24 DIAGNOSIS — D582 Other hemoglobinopathies: Secondary | ICD-10-CM | POA: Diagnosis not present

## 2020-12-24 DIAGNOSIS — M25562 Pain in left knee: Secondary | ICD-10-CM

## 2020-12-24 DIAGNOSIS — I10 Essential (primary) hypertension: Secondary | ICD-10-CM | POA: Diagnosis not present

## 2020-12-24 DIAGNOSIS — G8929 Other chronic pain: Secondary | ICD-10-CM

## 2020-12-24 DIAGNOSIS — N281 Cyst of kidney, acquired: Secondary | ICD-10-CM | POA: Diagnosis not present

## 2020-12-24 DIAGNOSIS — R739 Hyperglycemia, unspecified: Secondary | ICD-10-CM

## 2020-12-24 DIAGNOSIS — E559 Vitamin D deficiency, unspecified: Secondary | ICD-10-CM

## 2020-12-24 MED ORDER — METHYLPREDNISOLONE 4 MG PO TABS: ORAL_TABLET | ORAL | 0 refills | Status: DC

## 2020-12-24 NOTE — Telephone Encounter (Signed)
Pt scheduled  

## 2020-12-25 NOTE — Assessment & Plan Note (Signed)
Supplement and monitor 

## 2020-12-28 DIAGNOSIS — M79602 Pain in left arm: Secondary | ICD-10-CM | POA: Insufficient documentation

## 2020-12-28 DIAGNOSIS — M79601 Pain in right arm: Secondary | ICD-10-CM | POA: Insufficient documentation

## 2020-12-28 NOTE — Assessment & Plan Note (Signed)
She is following with orthopaedics Dr Stann Mainland at [facility] for recent increased pain

## 2020-12-28 NOTE — Assessment & Plan Note (Signed)
Monitor and report any concerns, no changes to meds. Encouraged heart healthy diet such as the DASH diet and exercise as tolerated.  ?

## 2020-12-28 NOTE — Progress Notes (Signed)
MyChart Video Visit    Virtual Visit via Video Note   This visit type was conducted due to national recommendations for restrictions regarding the COVID-19 Pandemic (e.g. social distancing) in an effort to limit this patient's exposure and mitigate transmission in our community. This patient is at least at moderate risk for complications without adequate follow up. This format is felt to be most appropriate for this patient at this time. Physical exam was limited by quality of the video and audio technology used for the visit. S Chism, CMA was able to get the patient set up on a video visit.  Patient location: home Patient and provider in visit Provider location: Office  I discussed the limitations of evaluation and management by telemedicine and the availability of in person appointments. The patient expressed understanding and agreed to proceed.  Visit Date: 12/24/2020  Today's healthcare provider: Penni Homans, MD     Subjective:    Patient ID: Kim Fox, female    DOB: 02/25/1966, 55 y.o.   MRN: [id number]  No chief complaint on file.   HPI Patient is in today for evaluation of pain. She is noting right arm worst than left arm. Her shoulders are involved at times but her elbosw to her forearm are the  most involved. Pain radiates into hands and fingers at time. No swelling, redness or warmth. No trauma or injury. Her left knee has been bothering her more than usual recently. Follows with Dr Stann Mainland at [facility]. No polyuria or polydipsia. Denies CP/palp/SOB/HA/congestion/fevers/GI or GU c/o. Taking meds as prescribed   Past Medical History:  Diagnosis Date   Dyslipidemia 10/17/2017   GERD (gastroesophageal reflux disease)    Headache(784.0)    migraine   History of chicken pox 04/10/2015   Hypertension    I 04/04/2014   Menorrhagia    Multiple renal cysts 08/09/2011   Murmur, cardiac    Echo WNL 2009   Obesity    Polycystic kidney disease    Preventative  health care 04/20/2015   Situational anxiety 10/17/2017   Urinary incontinence 10/28/2015    Past Surgical History:  Procedure Laterality Date   ENDOMETRIAL BIOPSY  2010   KIDNEY CYST REMOVAL  2002   TONSILLECTOMY     URETERAL STENT PLACEMENT  2002    Family History  Problem Relation Age of Onset   Diabetes Mother        insulin dependent   Hypertension Mother    Migraines Mother    Varicose Veins Mother    Arthritis Mother    Diabetes Father    Hypertension Father    Kidney disease Father    Arthritis Father    Migraines Sister    Allergies Sister    Renal Cyst Sister    Cancer Sister        skin cancer   Dysfunctional uterine bleeding Sister    Cancer Paternal Grandmother        lung   Cancer Paternal Grandfather        bladder, prostate   Heart disease Paternal Grandfather    Hyperlipidemia Daughter    Fibromyalgia Sister    Renal Cyst Sister    Hypertension Sister    Dysfunctional uterine bleeding Sister     Social History   Socioeconomic History   Marital status: Divorced    Spouse name: Not on file   Number of children: 2   Years of education: college   Highest education level: Not on file  Occupational History   Occupation: Aeronautical engineer: [employer]  Tobacco Use   Smoking status: Never   Smokeless tobacco: Never  Vaping Use   Vaping Use: Never used  Substance and Sexual Activity   Alcohol use: Yes    Comment: occ   Drug use: No   Sexual activity: Not on file  Other Topics Concern   Not on file  Social History Narrative   Not on file   Social Determinants of Health   Financial Resource Strain: Not on file  Food Insecurity: Not on file  Transportation Needs: Not on file  Physical Activity: Not on file  Stress: Not on file  Social Connections: Not on file  Intimate Partner Violence: Not on file    Outpatient Medications Prior to Visit  Medication Sig Dispense Refill   Ascorbic Acid (VITAMIN C) 1000 MG  tablet Take 1,000 mg by mouth daily.     aspirin EC 81 MG tablet Take 81 mg by mouth 2 (two) times daily.     atenolol (TENORMIN) 50 MG tablet Take 1 tablet (50 mg total) by mouth daily. 90 tablet 2   Biotin 5000 MCG CAPS Take 1 capsule by mouth daily.     Calcium Carbonate-Vitamin D (CALCIUM-VITAMIN D) 600-200 MG-UNIT CAPS Take 1 tablet by mouth 2 (two) times daily.     famotidine (PEPCID) 40 MG tablet TAKE 1/2 TO 1 TABLET BY MOUTH EVERY NIGHT AT BEDTIME AS NEEDED FOR HEARTBURN OR INDIGESTION 90 tablet 2   FLUoxetine (PROZAC) 20 MG tablet TAKE 1 TABLET(20 MG) BY MOUTH DAILY 90 tablet 1   folic acid (FOLVITE) 830 MCG tablet Take 400 mcg by mouth daily.     L-Lysine 1000 MG TABS Take 1 tablet by mouth daily.     Lidocaine HCl (ASPERCREME LIDOCAINE) 4 % CREA Apply 1 Dose topically 2 (two) times daily as needed. 133 g 11   Multiple Vitamin (MULTIVITAMIN) tablet Take 1 tablet by mouth daily.     Omega 3-6-9 Fatty Acids (OMEGA 3-6-9 COMPLEX PO) Omega 3-6-9     Probiotic Product (ACIDOPHILUS/GOAT MILK) CAPS Take by mouth.     vitamin B-12 (CYANOCOBALAMIN) 500 MCG tablet Take 500 mcg by mouth daily.     VITAMIN D PO Take by mouth.     No facility-administered medications prior to visit.    Allergies  Allergen Reactions   Nitrofurantoin Rash   Macrodantin Rash   Penicillins Rash    Review of Systems  Constitutional:  Negative for fever and malaise/fatigue.  HENT:  Negative for congestion.   Eyes:  Negative for blurred vision.  Respiratory:  Negative for shortness of breath.   Cardiovascular:  Negative for chest pain, palpitations and leg swelling.  Gastrointestinal:  Negative for abdominal pain, blood in stool and nausea.  Genitourinary:  Negative for dysuria and frequency.  Musculoskeletal:  Positive for joint pain and myalgias. Negative for falls.  Skin:  Negative for rash.  Neurological:  Negative for dizziness, loss of consciousness and headaches.  Endo/Heme/Allergies:  Negative for  environmental allergies.  Psychiatric/Behavioral:  Negative for depression. The patient is not nervous/anxious.       Objective:    Physical Exam Constitutional:      General: She is not in acute distress.    Appearance: Normal appearance. She is not ill-appearing or toxic-appearing.  HENT:     Head: Normocephalic and atraumatic.     Right Ear: External ear normal.  Left Ear: External ear normal.     Nose: Nose normal.  Eyes:     General:        Right eye: No discharge.        Left eye: No discharge.  Pulmonary:     Effort: Pulmonary effort is normal.  Skin:    Findings: No rash.  Neurological:     Mental Status: She is alert and oriented to person, place, and time.  Psychiatric:        Behavior: Behavior normal.    There were no vitals taken for this visit. Wt Readings from Last 3 Encounters:  02/28/20 293 lb (132.9 kg)  09/14/19 282 lb 6 oz (128.1 kg)  07/31/19 287 lb (130.2 kg)    Diabetic Foot Exam - Simple   No data filed    Lab Results  Component Value Date   WBC 8.0 08/11/2020   HGB 15.3 (H) 08/11/2020   HCT 46.8 (H) 08/11/2020   PLT 234.0 08/11/2020   GLUCOSE 103 (H) 08/11/2020   CHOL 176 08/11/2020   TRIG 162.0 (H) 08/11/2020   HDL 49.90 08/11/2020   LDLDIRECT 117.0 07/27/2018   LDLCALC 94 08/11/2020   ALT 17 08/11/2020   AST 19 08/11/2020   NA 138 08/11/2020   K 4.2 08/11/2020   CL 101 08/11/2020   CREATININE 0.82 08/11/2020   BUN 16 08/11/2020   CO2 29 08/11/2020   TSH 1.14 08/11/2020   INR 1.03 03/25/2014   HGBA1C 6.1 08/11/2020   MICROALBUR 1.0 10/17/2017    Lab Results  Component Value Date   TSH 1.14 08/11/2020   Lab Results  Component Value Date   WBC 8.0 08/11/2020   HGB 15.3 (H) 08/11/2020   HCT 46.8 (H) 08/11/2020   MCV 87.6 08/11/2020   PLT 234.0 08/11/2020   Lab Results  Component Value Date   NA 138 08/11/2020   K 4.2 08/11/2020   CHLORIDE 108 10/07/2016   CO2 29 08/11/2020   GLUCOSE 103 (H) 08/11/2020   BUN  16 08/11/2020   CREATININE 0.82 08/11/2020   BILITOT 0.4 08/11/2020   ALKPHOS 87 08/11/2020   AST 19 08/11/2020   ALT 17 08/11/2020   PROT 7.0 08/11/2020   ALBUMIN 3.7 08/11/2020   CALCIUM 9.2 08/11/2020   ANIONGAP 8 10/07/2016   EGFR 80 (L) 10/07/2016   GFR 81.77 08/11/2020   Lab Results  Component Value Date   CHOL 176 08/11/2020   Lab Results  Component Value Date   HDL 49.90 08/11/2020   Lab Results  Component Value Date   LDLCALC 94 08/11/2020   Lab Results  Component Value Date   TRIG 162.0 (H) 08/11/2020   Lab Results  Component Value Date   CHOLHDL 4 08/11/2020   Lab Results  Component Value Date   HGBA1C 6.1 08/11/2020       Assessment & Plan:   Problem List Items Addressed This Visit     Essential hypertension, benign   Relevant Orders   Comprehensive metabolic panel   Vitamin D deficiency    Supplement and monitor       Other Visit Diagnoses     Pain in both upper extremities    -  Primary   Relevant Orders   Rheumatoid Factor   Antinuclear Antib (ANA)   Sedimentation rate   Abnormal hemoglobin (Hgb) (HCC)       Relevant Orders   CBC w/Diff   Renal cyst  Relevant Orders   Comprehensive metabolic panel       I am having Kim Fox "[place]" start on methylPREDNISolone. I am also having her maintain her L-Lysine, Biotin, multivitamin, Calcium-Vitamin D, folic acid, Acidophilus/Goat Milk, aspirin EC, vitamin C, vitamin B-12, Omega 3-6-9 Fatty Acids (OMEGA 3-6-9 COMPLEX PO), VITAMIN D PO, famotidine, Lidocaine HCl, atenolol, and FLUoxetine.  Meds ordered this encounter  Medications   methylPREDNISolone (MEDROL) 4 MG tablet    Sig: 5 tabs po x 1 day then 4 tabs po x 1 day then 3 tabs po x 1 day then 2 tabs po x 1 day then 1 tab po x 1 day and stop    Dispense:  15 tablet    Refill:  0    I discussed the assessment and treatment plan with the patient. The patient was provided an opportunity to ask questions and all were  answered. The patient agreed with the plan and demonstrated an understanding of the instructions.   The patient was advised to call back or seek an in-person evaluation if the symptoms worsen or if the condition fails to improve as anticipated.  I provided 20 minutes of face-to-face time during this encounter.   Penni Homans, MD [facility] [phone] (phone) [phone] (fax)  [place]

## 2020-12-28 NOTE — Assessment & Plan Note (Signed)
Check labs including Rheumatoid Factor. Encouraged moist heat and gentle stretching as tolerated. May try NSAIDs and prescription meds as directed and report if symptoms worsen or seek immediate care

## 2020-12-28 NOTE — Assessment & Plan Note (Signed)
hgba1c acceptable, minimize simple carbs. Increase exercise as tolerated.  

## 2020-12-29 ENCOUNTER — Other Ambulatory Visit (INDEPENDENT_AMBULATORY_CARE_PROVIDER_SITE_OTHER): Payer: BC Managed Care – PPO

## 2020-12-29 ENCOUNTER — Other Ambulatory Visit: Payer: Self-pay

## 2020-12-29 DIAGNOSIS — D582 Other hemoglobinopathies: Secondary | ICD-10-CM

## 2020-12-29 DIAGNOSIS — N281 Cyst of kidney, acquired: Secondary | ICD-10-CM | POA: Diagnosis not present

## 2020-12-29 DIAGNOSIS — M79602 Pain in left arm: Secondary | ICD-10-CM | POA: Diagnosis not present

## 2020-12-29 DIAGNOSIS — I10 Essential (primary) hypertension: Secondary | ICD-10-CM | POA: Diagnosis not present

## 2020-12-29 DIAGNOSIS — M79601 Pain in right arm: Secondary | ICD-10-CM

## 2020-12-29 LAB — CBC WITH DIFFERENTIAL/PLATELET
Basophils Absolute: 0.1 10*3/uL (ref 0.0–0.1)
Basophils Relative: 1.1 % (ref 0.0–3.0)
Eosinophils Absolute: 0.1 10*3/uL (ref 0.0–0.7)
Eosinophils Relative: 1.1 % (ref 0.0–5.0)
HCT: 46 % (ref 36.0–46.0)
Hemoglobin: 15.3 g/dL — ABNORMAL HIGH (ref 12.0–15.0)
Lymphocytes Relative: 44.4 % (ref 12.0–46.0)
Lymphs Abs: 5.4 10*3/uL — ABNORMAL HIGH (ref 0.7–4.0)
MCHC: 33.2 g/dL (ref 30.0–36.0)
MCV: 87 fl (ref 78.0–100.0)
Monocytes Absolute: 1 10*3/uL (ref 0.1–1.0)
Monocytes Relative: 8.5 % (ref 3.0–12.0)
Neutro Abs: 5.4 10*3/uL (ref 1.4–7.7)
Neutrophils Relative %: 44.9 % (ref 43.0–77.0)
Platelets: 229 10*3/uL (ref 150.0–400.0)
RBC: 5.29 Mil/uL — ABNORMAL HIGH (ref 3.87–5.11)
RDW: 14.2 % (ref 11.5–15.5)
WBC: 12.1 10*3/uL — ABNORMAL HIGH (ref 4.0–10.5)

## 2020-12-29 LAB — COMPREHENSIVE METABOLIC PANEL
ALT: 30 U/L (ref 0–35)
AST: 21 U/L (ref 0–37)
Albumin: 3.8 g/dL (ref 3.5–5.2)
Alkaline Phosphatase: 93 U/L (ref 39–117)
BUN: 27 mg/dL — ABNORMAL HIGH (ref 6–23)
CO2: 29 mEq/L (ref 19–32)
Calcium: 9.1 mg/dL (ref 8.4–10.5)
Chloride: 103 mEq/L (ref 96–112)
Creatinine, Ser: 0.89 mg/dL (ref 0.40–1.20)
GFR: 73.91 mL/min (ref >60.00)
Glucose, Bld: 94 mg/dL (ref 70–99)
Potassium: 4.1 mEq/L (ref 3.5–5.1)
Sodium: 140 mEq/L (ref 135–145)
Total Bilirubin: 0.3 mg/dL (ref 0.2–1.2)
Total Protein: 6.8 g/dL (ref 6.0–8.3)

## 2020-12-29 LAB — SEDIMENTATION RATE: Sed Rate: 37 mm/hr — ABNORMAL HIGH (ref 0–30)

## 2020-12-30 ENCOUNTER — Other Ambulatory Visit: Payer: Self-pay

## 2020-12-30 DIAGNOSIS — D72829 Elevated white blood cell count, unspecified: Secondary | ICD-10-CM

## 2020-12-30 DIAGNOSIS — M25562 Pain in left knee: Secondary | ICD-10-CM | POA: Diagnosis not present

## 2020-12-30 DIAGNOSIS — S83419A Sprain of medial collateral ligament of unspecified knee, initial encounter: Secondary | ICD-10-CM | POA: Insufficient documentation

## 2020-12-30 DIAGNOSIS — R700 Elevated erythrocyte sedimentation rate: Secondary | ICD-10-CM

## 2020-12-30 DIAGNOSIS — S83412A Sprain of medial collateral ligament of left knee, initial encounter: Secondary | ICD-10-CM | POA: Diagnosis not present

## 2020-12-30 DIAGNOSIS — I10 Essential (primary) hypertension: Secondary | ICD-10-CM

## 2021-01-01 DIAGNOSIS — F4323 Adjustment disorder with mixed anxiety and depressed mood: Secondary | ICD-10-CM | POA: Diagnosis not present

## 2021-01-01 LAB — ANA: Anti Nuclear Antibody (ANA): NEGATIVE

## 2021-01-01 LAB — RHEUMATOID FACTOR: Rheumatoid fact SerPl-aCnc: <14 IU/mL (ref <14)

## 2021-01-14 ENCOUNTER — Other Ambulatory Visit (INDEPENDENT_AMBULATORY_CARE_PROVIDER_SITE_OTHER): Payer: BC Managed Care – PPO

## 2021-01-14 ENCOUNTER — Other Ambulatory Visit: Payer: Self-pay

## 2021-01-14 DIAGNOSIS — E785 Hyperlipidemia, unspecified: Secondary | ICD-10-CM | POA: Diagnosis not present

## 2021-01-14 DIAGNOSIS — R700 Elevated erythrocyte sedimentation rate: Secondary | ICD-10-CM

## 2021-01-14 DIAGNOSIS — R739 Hyperglycemia, unspecified: Secondary | ICD-10-CM

## 2021-01-14 DIAGNOSIS — I10 Essential (primary) hypertension: Secondary | ICD-10-CM | POA: Diagnosis not present

## 2021-01-14 LAB — COMPREHENSIVE METABOLIC PANEL
ALT: 18 U/L (ref 0–35)
AST: 19 U/L (ref 0–37)
Albumin: 3.8 g/dL (ref 3.5–5.2)
Alkaline Phosphatase: 85 U/L (ref 39–117)
BUN: 18 mg/dL (ref 6–23)
CO2: 27 mEq/L (ref 19–32)
Calcium: 8.9 mg/dL (ref 8.4–10.5)
Chloride: 103 mEq/L (ref 96–112)
Creatinine, Ser: 0.79 mg/dL (ref 0.40–1.20)
GFR: 85.25 mL/min (ref >60.00)
Glucose, Bld: 100 mg/dL — ABNORMAL HIGH (ref 70–99)
Potassium: 4.3 mEq/L (ref 3.5–5.1)
Sodium: 138 mEq/L (ref 135–145)
Total Bilirubin: 0.6 mg/dL (ref 0.2–1.2)
Total Protein: 7.1 g/dL (ref 6.0–8.3)

## 2021-01-14 LAB — LIPID PANEL
Cholesterol: 195 mg/dL (ref 0–200)
HDL: 51.3 mg/dL (ref >39.00)
LDL Cholesterol: 108 mg/dL — ABNORMAL HIGH (ref 0–99)
NonHDL: 143.99
Total CHOL/HDL Ratio: 4
Triglycerides: 180 mg/dL — ABNORMAL HIGH (ref 0.0–149.0)
VLDL: 36 mg/dL (ref 0.0–40.0)

## 2021-01-14 LAB — SEDIMENTATION RATE: Sed Rate: 46 mm/hr — ABNORMAL HIGH (ref 0–30)

## 2021-01-14 LAB — CBC
HCT: 44.7 % (ref 36.0–46.0)
Hemoglobin: 14.8 g/dL (ref 12.0–15.0)
MCHC: 33.1 g/dL (ref 30.0–36.0)
MCV: 87.6 fl (ref 78.0–100.0)
Platelets: 218 10*3/uL (ref 150.0–400.0)
RBC: 5.1 Mil/uL (ref 3.87–5.11)
RDW: 14.3 % (ref 11.5–15.5)
WBC: 7.5 10*3/uL (ref 4.0–10.5)

## 2021-01-14 LAB — HEMOGLOBIN A1C: Hgb A1c MFr Bld: 6.2 % (ref 4.6–6.5)

## 2021-01-14 LAB — TSH: TSH: 1.26 u[IU]/mL (ref 0.35–5.50)

## 2021-01-22 DIAGNOSIS — F4323 Adjustment disorder with mixed anxiety and depressed mood: Secondary | ICD-10-CM | POA: Diagnosis not present

## 2021-01-28 ENCOUNTER — Encounter: Payer: Self-pay | Admitting: Physical Therapy

## 2021-01-28 ENCOUNTER — Ambulatory Visit: Payer: BC Managed Care – PPO | Attending: Orthopedic Surgery | Admitting: Physical Therapy

## 2021-01-28 ENCOUNTER — Other Ambulatory Visit: Payer: Self-pay

## 2021-01-28 DIAGNOSIS — M25662 Stiffness of left knee, not elsewhere classified: Secondary | ICD-10-CM | POA: Insufficient documentation

## 2021-01-28 DIAGNOSIS — R29898 Other symptoms and signs involving the musculoskeletal system: Secondary | ICD-10-CM | POA: Insufficient documentation

## 2021-01-28 DIAGNOSIS — M25562 Pain in left knee: Secondary | ICD-10-CM | POA: Diagnosis not present

## 2021-01-28 NOTE — Therapy (Signed)
[place] [place], [place], [address] Phone: [phone]   Fax:  [phone]  Physical Therapy Evaluation  Patient Details  Name: Kim Fox MRN: [id number] Date of Birth: 1965-12-08 Referring Provider (PT): Victorino December, MD   Encounter Date: 01/28/2021   PT End of Session - 01/28/21 0843     Visit Number 1    Number of Visits 7    Date for PT Re-Evaluation 03/11/21    Authorization Type BCBS    PT Start Time 0802    PT Stop Time [id number]    PT Time Calculation (min) 33 min    Activity Tolerance Patient tolerated treatment well    Behavior During Therapy [facility] for tasks assessed/performed             Past Medical History:  Diagnosis Date   Dyslipidemia 10/17/2017   GERD (gastroesophageal reflux disease)    Headache(784.0)    migraine   History of chicken pox 04/10/2015   Hypertension    I 04/04/2014   Menorrhagia    Multiple renal cysts 08/09/2011   Murmur, cardiac    Echo WNL 2009   Obesity    Polycystic kidney disease    Preventative health care 04/20/2015   Situational anxiety 10/17/2017   Urinary incontinence 10/28/2015    Past Surgical History:  Procedure Laterality Date   ENDOMETRIAL BIOPSY  2010   KIDNEY CYST REMOVAL  2002   TONSILLECTOMY     URETERAL STENT PLACEMENT  2002    There were no vitals filed for this visit.    Subjective Assessment - 01/28/21 0803     Subjective Patient has a hx of L ACL tear in 2020 which improved with PT. Recently on December 09, 2020 she was rushing to get out of the car when the L foot slid, knee fell into valgus, and she fell to the ground. Heard a pop in her knee which has happened in the past. Pain is located over the medial knee, sometimes over the lateral and superior aspect. Worse with prolonged sitting. Better with knee brace. Does lot of sitting at work and by the end of the day she she has a lot of swelling in the L LE.    Pertinent History  HLD, GERD, HA, HTN, urinary incontinence, L ACL tear    Limitations Standing;Walking;House hold activities;Lifting    How long can you sit comfortably? a couple hours    How long can you stand comfortably? not limited    How long can you walk comfortably? not limited    Diagnostic tests 02/28/20 L knee xray: No evidence of acute fracture or malalignment. Limited evaluation for joint effusion.    Patient Stated Goals decrease pain    Currently in Pain? No/denies    Pain Score 0-No pain    Pain Location Knee    Pain Orientation Left;Medial;Lateral    Pain Descriptors / Indicators Aching;Sore    Pain Type Chronic pain                [facility] PT Assessment - 01/28/21 0810       Assessment   Medical Diagnosis sprain of the medial collateral ligament of the left knee    Referring Provider (PT) Victorino December, MD    Onset Date/Surgical Date 12/09/20    Next MD Visit 02/10/21    Prior Therapy yes- s/p ACL tear      Precautions   Precautions None  Balance Screen   Has the patient fallen in the past 6 months Yes    How many times? 1    Has the patient had a decrease in activity level because of a fear of falling?  No    Is the patient reluctant to leave their home because of a fear of falling?  No      Home Environment   Living Environment Private residence    Living Arrangements Alone    Type of [place] Access Stairs to enter    Entrance Stairs-Number of Steps 1    [place] One level    [place]      Prior Function   Level of Independence Independent    Vocation Full time employment    Vocation Requirements HR- computer work    Leisure walk on the [employer]    Overall Cognitive Status Within Functional Limits for tasks assessed      Observation/Other Assessments   Observations donjoy brace donned on L knee      Sensation   Light Touch Appears Intact      Coordination   Gross Motor Movements are Fluid and Coordinated Yes       Posture/Postural Control   Posture/Postural Control Postural limitations    Postural Limitations Rounded Shoulders    Posture Comments B knee valgus slightly      ROM / Strength   AROM / PROM / Strength AROM;PROM;Strength      AROM   Right Knee Extension -1    Right Knee Flexion 129    Left Knee Extension 0    Left Knee Flexion 131      PROM   PROM Assessment Site Knee    Right/Left Knee Right;Left    Right Knee Extension -3    Right Knee Flexion 133    Left Knee Extension -2    Left Knee Flexion 140      Strength   Right Hip Flexion 4+/5    Right Hip ABduction 4+/5    Right Hip ADduction 4+/5    Left Hip Flexion 4+/5    Left Hip ABduction 4+/5    Left Hip ADduction 4+/5    Right Knee Flexion 5/5    Right Knee Extension 5/5    Left Knee Flexion 4+/5   discomfort in medial knee   Left Knee Extension 4+/5    Right Ankle Dorsiflexion 4+/5    Right Ankle Plantar Flexion 4+/5    Left Ankle Dorsiflexion 4+/5    Left Ankle Plantar Flexion 4+/5      Flexibility   Soft Tissue Assessment /Muscle Length yes    ITB B WFL      Palpation   Patella mobility B moderately hypomobile in all directions    Palpation comment mild swelling grossly over L knee; TTP over L MCL, medial aspect of patella, ITB insertion      Special Tests   Other special tests anterior translation of B tibias with squat with slight B valgus collapse- c/o "tightening"      Ambulation/Gait   Assistive device None    Gait Pattern Step-through pattern;Decreased stance time - left;Decreased step length - right;Decreased weight shift to left;Lateral trunk lean to left    Ambulation Surface Level;Indoor    Gait velocity WFL                        Objective  measurements completed on examination: See above findings.               PT Education - 01/28/21 0842     Education Details prognosis, POC, HEP    Person(s) Educated Patient    Methods Explanation;Demonstration;Tactile  cues;Verbal cues;Handout    Comprehension Verbalized understanding;Returned demonstration              PT Short Term Goals - 01/28/21 0850       PT SHORT TERM GOAL #1   Title Pt will be independent with her HEP    Time 3    Status New    Target Date 02/18/21               PT Long Term Goals - 01/28/21 0851       PT LONG TERM GOAL #1   Title Patient to be independent with advanced HEP.    Time 6    Period Weeks    Status New    Target Date 03/11/21      PT LONG TERM GOAL #2   Title Pt will demonstrate 5/5 strength in the L knee/ankle and Bil hips in order to demonstrate improved functional strength for decreased risk of injury related to ligament deficits.    Time 6    Period Weeks    Status New    Target Date 03/11/21      PT LONG TERM GOAL #3   Title Patient to report tolerance for a full work day without pain.    Time 6    Period Weeks    Status New    Target Date 03/11/21      PT LONG TERM GOAL #4   Title Patient to demonstrate alternating reciprocal pattern when ascending and descending stairs with good quad stability and 1 handrail as needed.    Time 6    Period Weeks    Status New    Target Date 03/11/21                    Plan - 01/28/21 0843     Clinical Impression Statement Patient is a 54 y/o F presenting to OPPT with c/o acute L knee pain for the past 2 months after a fall. Notes that her L foot slid out while the knee fell into valgus. PMH significant for L ACL tear treated conservatively in 2020. Pain is located over the medial knee, sometimes over the lateral and superior aspect. Worse with prolonged sitting, which she is required to do for work. Patient today presenting with rounded shoulders, slight B knee valgus at rest, good B knee ROM, discomfort with resisted L knee flexion, B patellar hypomobility, L knee edema, TTP over L MCL, medial aspect of patella, ITB insertion, poor knee mechanics with squatting, and gait deviations.  Patient was educated on gentle strength and stability HEP and reported understanding. Would benefit from skilled PT services 1x/week for 6 weeks to address aforementioned impairments.    Personal Factors and Comorbidities Comorbidity 3+;Age;Fitness;Past/Current Experience;Profession;Time since onset of injury/illness/exacerbation    Comorbidities HLD, GERD, HA, HTN, urinary incontinence, L ACL tear    Examination-Activity Limitations Bend;Sit;Carry;Squat;Stairs;Lift    Examination-Participation Restrictions [employer];Shop;Driving;Church;Meal Prep;Occupation;Cleaning    Stability/Clinical Decision Making Stable/Uncomplicated    Clinical Decision Making Low    Rehab Potential Good    PT Frequency 1x / week    PT Duration 6 weeks    PT Treatment/Interventions ADLs/Self Care Home Management;Moist Heat;Cryotherapy;Electrical Stimulation;Ultrasound;Manual techniques;Neuromuscular  re-education;Patient/family education;Passive range of motion;Therapeutic exercise;Balance training;Therapeutic activities;Functional mobility training;Stair training;Gait training;Taping;Vasopneumatic Device;Iontophoresis '4mg'$ /ml Dexamethasone;Dry needling    PT Next Visit Plan knee FOTO, work on Stage manager, functional strength and stability    Consulted and Agree with Plan of Care Patient             Patient will benefit from skilled therapeutic intervention in order to improve the following deficits and impairments:  Abnormal gait, Increased fascial restricitons, Decreased activity tolerance, Pain, Impaired flexibility, Improper body mechanics, Hypomobility, Decreased balance, Decreased strength, Postural dysfunction  Visit Diagnosis: Acute pain of left knee  Other symptoms and signs involving the musculoskeletal system     Problem List Patient Active Problem List   Diagnosis Date Noted   Bilateral arm pain 12/28/2020   Educated about COVID-19 virus infection 10/31/2019   Vitamin D  deficiency 07/31/2019   Left knee pain 07/31/2019   Polycythemia 07/30/2018   Skin lesion 07/30/2018   Snoring 07/30/2018   Foot pain, bilateral 07/30/2018   Hyperglycemia 07/27/2018   Perimenopausal 04/20/2018   Depression with anxiety 04/20/2018   Dyslipidemia 10/17/2017   Situational anxiety 10/17/2017   Cervical cancer screening 10/14/2016   Urinary incontinence 10/28/2015   Absence of bladder continence 10/14/2015   Preventative health care 04/20/2015   History of chicken pox 04/10/2015   Right leg DVT ([place]) 05/24/2014   DUB (dysfunctional uterine bleeding) 10/11/2013   Essential hypertension, benign 08/09/2011   GERD (gastroesophageal reflux disease) 08/09/2011   Migraine 08/09/2011   Multiple renal cysts 08/09/2011   Obesity 08/09/2011   H/O cardiac murmur 08/09/2011   Abnormal mammogram 08/09/2011     Janene Harvey, PT, DPT 01/28/21 8:55 AM    [place]. [place], [place], [address] Phone: [phone]   Fax:  [phone]  Name: WANDY READ MRN: [id number] Date of Birth: [birth year]

## 2021-01-28 NOTE — Patient Instructions (Signed)
Access Code: [email or web address] URL: [email or web address] Date: 01/28/2021 Prepared by: Grayling Congress  Exercises Standing Terminal Knee Extension at [facility] with Diona Foley - 2 x daily - 7 x weekly - 2 sets - 10 reps - 3 sec hold Wall Squat - 2 x daily - 7 x weekly - 2 sets - 10 reps Single Leg Stance - 2 x daily - 7 x weekly - 2 sets - 30 sec hold Sidelying Hip Abduction - 2 x daily - 7 x weekly - 2 sets - 10 reps

## 2021-01-30 ENCOUNTER — Encounter: Payer: BC Managed Care – PPO | Admitting: Physical Therapy

## 2021-02-04 ENCOUNTER — Other Ambulatory Visit: Payer: Self-pay

## 2021-02-04 ENCOUNTER — Encounter: Payer: Self-pay | Admitting: Physical Therapy

## 2021-02-04 ENCOUNTER — Ambulatory Visit: Payer: BC Managed Care – PPO | Admitting: Physical Therapy

## 2021-02-04 DIAGNOSIS — M25562 Pain in left knee: Secondary | ICD-10-CM | POA: Diagnosis not present

## 2021-02-04 DIAGNOSIS — R29898 Other symptoms and signs involving the musculoskeletal system: Secondary | ICD-10-CM

## 2021-02-04 DIAGNOSIS — M25662 Stiffness of left knee, not elsewhere classified: Secondary | ICD-10-CM | POA: Diagnosis not present

## 2021-02-04 NOTE — Therapy (Signed)
[place]. [place], [place], [address] Phone: [phone]   Fax:  [phone]  Physical Therapy Treatment  Patient Details  Name: Kim Fox MRN: [id number] Date of Birth: 04/12/1966 Referring Provider (PT): Victorino December, MD   Encounter Date: 02/04/2021   PT End of Session - 02/04/21 0844     Visit Number 2    Number of Visits 7    Date for PT Re-Evaluation 03/11/21    Authorization Type BCBS    PT Start Time 0801    PT Stop Time [id number]    PT Time Calculation (min) 42 min    Activity Tolerance Patient tolerated treatment well    Behavior During Therapy [facility] for tasks assessed/performed             Past Medical History:  Diagnosis Date   Dyslipidemia 10/17/2017   GERD (gastroesophageal reflux disease)    Headache(784.0)    migraine   History of chicken pox 04/10/2015   Hypertension    I 04/04/2014   Menorrhagia    Multiple renal cysts 08/09/2011   Murmur, cardiac    Echo WNL 2009   Obesity    Polycystic kidney disease    Preventative health care 04/20/2015   Situational anxiety 10/17/2017   Urinary incontinence 10/28/2015    Past Surgical History:  Procedure Laterality Date   ENDOMETRIAL BIOPSY  2010   KIDNEY CYST REMOVAL  2002   TONSILLECTOMY     URETERAL STENT PLACEMENT  2002    There were no vitals filed for this visit.   Subjective Assessment - 02/04/21 0802     Subjective Did not feel well for a couple days last week but feeling better now. Also went to the beach but did not walk on the sand d/t her knee. Was able to go up stairs without pain.    Pertinent History HLD, GERD, HA, HTN, urinary incontinence, L ACL tear    Diagnostic tests 02/28/20 L knee xray: No evidence of acute fracture or malalignment. Limited evaluation for joint effusion.    Patient Stated Goals decrease pain    Currently in Pain? No/denies    Multiple Pain Sites Yes    Pain Score 6    Pain Location Elbow     Pain Orientation Left    Pain Type Acute pain                [facility] PT Assessment - 02/04/21 0808       Observation/Other Assessments   Focus on Therapeutic Outcomes (FOTO)  knee: 61                           [facility] Adult PT Treatment/Exercise - 02/04/21 0001       Knee/Hip Exercises: Aerobic   Recumbent Bike Lvl 1.5  for 6 min      Knee/Hip Exercises: Standing   Terminal Knee Extension Left;10 reps;1 set    Terminal Knee Extension Limitations 10x3" ball behind the knee .   cues for proper starting position   Lateral Step Up Left;5 reps;2 sets;Step Height: 4";Hand Hold: 2    Lateral Step Up Limitations heel touch    Step Down Left;2 sets;Hand Hold: 2;Step Height: 4"    Step Down Limitations c/o L knee "tightness"    Functional Squat 1 set;10 reps    Functional Squat Limitations red TB above knees   cues for posterior wt shift, holding  onto stair rail   Wall Squat 1 set;10 reps    Wall Squat Limitations good tolerance    SLS with Vectors L SLS + multidirection reach to cones x6 min   L ankle instability most evident   Other Standing Knee Exercises sidestepping with red loop around ankles 2x15 ft   good form     Knee/Hip Exercises: Sidelying   Hip ABduction AROM;Strengthening;1 set;Left;10 reps    Hip ABduction Limitations 10x 0#, 10x 2#   cues to avoid hip ER                   PT Education - 02/04/21 0843     Education Details update to HEP    Person(s) Educated Patient    Methods Explanation;Demonstration;Tactile cues;Handout;Verbal cues    Comprehension Verbalized understanding;Returned demonstration              PT Short Term Goals - 02/04/21 0846       PT SHORT TERM GOAL #1   Title Pt will be independent with her HEP    Time 3    Status Achieved    Target Date 02/18/21               PT Long Term Goals - 02/04/21 0846       PT LONG TERM GOAL #1   Title Patient to be independent with advanced HEP.    Time 6     Period Weeks    Status On-going      PT LONG TERM GOAL #2   Title Pt will demonstrate 5/5 strength in the L knee/ankle and Bil hips in order to demonstrate improved functional strength for decreased risk of injury related to ligament deficits.    Time 6    Period Weeks    Status On-going      PT LONG TERM GOAL #3   Title Patient to report tolerance for a full work day without pain.    Time 6    Period Weeks    Status On-going      PT LONG TERM GOAL #4   Title Patient to demonstrate alternating reciprocal pattern when ascending and descending stairs with good quad stability and 1 handrail as needed.    Time 6    Period Weeks    Status On-going                   Plan - 02/04/21 0844     Clinical Impression Statement Patient without new complaints today. Notes that she avoided walking on the beach d/t her knee, however was able to navigate stairs without pain recently when at the beach. Patient required cues for avoiding compensations and improving alignment with sidelying hip strengthening- good carryover after cues. Squat form was well-demonstrated. Patient noted no pain with moderate squat depth today. Anterior and lateral step downs demonstrated crepitus and patient with c/o tightness in the knee, however fairly good stability was demonstrated at 4 inch step. L ankle instability was most prominent with SLS activities- patient recalls 2 remote L foot fractures. Updated HEP with exercises that were well-tolerated today. Patient reported understanding and without complaints at end of session.    Personal Factors and Comorbidities Comorbidity 3+;Age;Fitness;Past/Current Experience;Profession;Time since onset of injury/illness/exacerbation    Comorbidities HLD, GERD, HA, HTN, urinary incontinence, L ACL tear    Examination-Activity Limitations Bend;Sit;Carry;Squat;Stairs;Lift    Examination-Participation Restrictions [employer];Shop;Driving;Church;Meal  Prep;Occupation;Cleaning    Stability/Clinical Decision Making Stable/Uncomplicated    Rehab Potential  Good    PT Frequency 1x / week    PT Duration 6 weeks    PT Treatment/Interventions ADLs/Self Care Home Management;Moist Heat;Cryotherapy;Electrical Stimulation;Ultrasound;Manual techniques;Neuromuscular re-education;Patient/family education;Passive range of motion;Therapeutic exercise;Balance training;Therapeutic activities;Functional mobility training;Stair training;Gait training;Taping;Vasopneumatic Device;Iontophoresis '4mg'$ /ml Dexamethasone;Dry needling    PT Next Visit Plan work on Stage manager, functional strength and stability    Consulted and Agree with Plan of Care Patient             Patient will benefit from skilled therapeutic intervention in order to improve the following deficits and impairments:  Abnormal gait, Increased fascial restricitons, Decreased activity tolerance, Pain, Impaired flexibility, Improper body mechanics, Hypomobility, Decreased balance, Decreased strength, Postural dysfunction  Visit Diagnosis: Acute pain of left knee  Other symptoms and signs involving the musculoskeletal system  Stiffness of left knee, not elsewhere classified     Problem List Patient Active Problem List   Diagnosis Date Noted   Bilateral arm pain 12/28/2020   Educated about COVID-19 virus infection 10/31/2019   Vitamin D deficiency 07/31/2019   Left knee pain 07/31/2019   Polycythemia 07/30/2018   Skin lesion 07/30/2018   Snoring 07/30/2018   Foot pain, bilateral 07/30/2018   Hyperglycemia 07/27/2018   Perimenopausal 04/20/2018   Depression with anxiety 04/20/2018   Dyslipidemia 10/17/2017   Situational anxiety 10/17/2017   Cervical cancer screening 10/14/2016   Urinary incontinence 10/28/2015   Absence of bladder continence 10/14/2015   Preventative health care 04/20/2015   History of chicken pox 04/10/2015   Right leg DVT ([place]) 05/24/2014   DUB  (dysfunctional uterine bleeding) 10/11/2013   Essential hypertension, benign 08/09/2011   GERD (gastroesophageal reflux disease) 08/09/2011   Migraine 08/09/2011   Multiple renal cysts 08/09/2011   Obesity 08/09/2011   H/O cardiac murmur 08/09/2011   Abnormal mammogram 08/09/2011     Janene Harvey, PT, DPT 02/04/21 8:47 AM   [place]. [place], [place], [address] Phone: [phone]   Fax:  [phone]  Name: Kim Fox MRN: [id number] Date of Birth: 11-24-1965

## 2021-02-09 ENCOUNTER — Ambulatory Visit: Payer: BC Managed Care – PPO | Admitting: Physical Therapy

## 2021-02-11 ENCOUNTER — Ambulatory Visit: Payer: BC Managed Care – PPO | Admitting: Physical Therapy

## 2021-02-11 ENCOUNTER — Other Ambulatory Visit: Payer: Self-pay

## 2021-02-11 ENCOUNTER — Encounter: Payer: Self-pay | Admitting: Physical Therapy

## 2021-02-11 DIAGNOSIS — M25662 Stiffness of left knee, not elsewhere classified: Secondary | ICD-10-CM | POA: Diagnosis not present

## 2021-02-11 DIAGNOSIS — R29898 Other symptoms and signs involving the musculoskeletal system: Secondary | ICD-10-CM

## 2021-02-11 DIAGNOSIS — M25562 Pain in left knee: Secondary | ICD-10-CM | POA: Diagnosis not present

## 2021-02-11 NOTE — Therapy (Signed)
[place]. [place], [place], [address] Phone: [phone]   Fax:  [phone]  Physical Therapy Treatment  Patient Details  Name: Kim Fox MRN: [id number] Date of Birth: [birth year] Referring Provider (PT): Victorino December, MD   Encounter Date: 02/11/2021   PT End of Session - 02/11/21 0843     Visit Number 3    Number of Visits 7    Date for PT Re-Evaluation 03/11/21    Authorization Type BCBS    PT Start Time 0803    PT Stop Time [id number]    PT Time Calculation (min) 39 min    Activity Tolerance Patient tolerated treatment well    Behavior During Therapy [facility] for tasks assessed/performed             Past Medical History:  Diagnosis Date   Dyslipidemia 10/17/2017   GERD (gastroesophageal reflux disease)    Headache(784.0)    migraine   History of chicken pox 04/10/2015   Hypertension    I 04/04/2014   Menorrhagia    Multiple renal cysts 08/09/2011   Murmur, cardiac    Echo WNL 2009   Obesity    Polycystic kidney disease    Preventative health care 04/20/2015   Situational anxiety 10/17/2017   Urinary incontinence 10/28/2015    Past Surgical History:  Procedure Laterality Date   ENDOMETRIAL BIOPSY  2010   KIDNEY CYST REMOVAL  2002   TONSILLECTOMY     URETERAL STENT PLACEMENT  2002    There were no vitals filed for this visit.   Subjective Assessment - 02/11/21 0801     Subjective The knee is achey at times, especially on Saturday after some shopping. Had trouble doing her wall slide on Thursday d/t thigh muscle fatigue. Noticing improvemen tin her balance.    Pertinent History HLD, GERD, HA, HTN, urinary incontinence, L ACL tear    Diagnostic tests 02/28/20 L knee xray: No evidence of acute fracture or malalignment. Limited evaluation for joint effusion.    Patient Stated Goals decrease pain    Currently in Pain? No/denies                               [facility] Adult  PT Treatment/Exercise - 02/11/21 0001       Exercises   Exercises Knee/Hip      Knee/Hip Exercises: Stretches   ITB Stretch Right;Left;1 rep;30 seconds    ITB Stretch Limitations supine with strap    Piriformis Stretch Right;Left;30 seconds    Piriformis Stretch Limitations supine fig 4 & KTOS each      Knee/Hip Exercises: Aerobic   Recumbent Bike Lvl 2.0  for 6 min      Knee/Hip Exercises: Standing   Lateral Step Up Left;1 set;10 reps;Hand Hold: 2;Step Height: 4"    Lateral Step Up Limitations lateral step down + heel touch with red TB at lateral knee    Step Down Left;2 sets;Hand Hold: 2;Step Height: 4"    Step Down Limitations red TB at lateral knee    Other Standing Knee Exercises L/R single leg heel raise 10x; on foam 10x; R/L SLS dumbbell pass with back toe down 2x10 each   B ankle instability, R>L   Other Standing Knee Exercises sidestepping with red loop around ankles 4x15 ft; monster walk with red TB 4x25f   cues to maintain still trunk  PT Short Term Goals - 02/04/21 0846       PT SHORT TERM GOAL #1   Title Pt will be independent with her HEP    Time 3    Status Achieved    Target Date 02/18/21               PT Long Term Goals - 02/04/21 0846       PT LONG TERM GOAL #1   Title Patient to be independent with advanced HEP.    Time 6    Period Weeks    Status On-going      PT LONG TERM GOAL #2   Title Pt will demonstrate 5/5 strength in the L knee/ankle and Bil hips in order to demonstrate improved functional strength for decreased risk of injury related to ligament deficits.    Time 6    Period Weeks    Status On-going      PT LONG TERM GOAL #3   Title Patient to report tolerance for a full work day without pain.    Time 6    Period Weeks    Status On-going      PT LONG TERM GOAL #4   Title Patient to demonstrate alternating reciprocal pattern when ascending and descending stairs with good quad stability and 1  handrail as needed.    Time 6    Period Weeks    Status On-going                   Plan - 02/11/21 0844     Clinical Impression Statement Patient reports improvement in perceived balance but still notes achiness in the L knee with prolonged walking. Continued working on step downs with addition of banded resistance at knee for stability challenge. Light crepitus was evident, but patient reported no pain and able to maintain good alignment throughout. Single leg strengthening and stability exercises were performed with good effort throughout; most instability was demonstrated on compliant surface. Patient remarked on increased instability on R vs L foot today. Reported noticing some R>L lateral hip pain, which was addressed with gentle stretching. Patient tolerated session well and without complaints at end of session. Progressing well towards goals.    Personal Factors and Comorbidities Comorbidity 3+;Age;Fitness;Past/Current Experience;Profession;Time since onset of injury/illness/exacerbation    Comorbidities HLD, GERD, HA, HTN, urinary incontinence, L ACL tear    Examination-Activity Limitations Bend;Sit;Carry;Squat;Stairs;Lift    Examination-Participation Restrictions [employer];Shop;Driving;Church;Meal Prep;Occupation;Cleaning    Stability/Clinical Decision Making Stable/Uncomplicated    Rehab Potential Good    PT Frequency 1x / week    PT Duration 6 weeks    PT Treatment/Interventions ADLs/Self Care Home Management;Moist Heat;Cryotherapy;Electrical Stimulation;Ultrasound;Manual techniques;Neuromuscular re-education;Patient/family education;Passive range of motion;Therapeutic exercise;Balance training;Therapeutic activities;Functional mobility training;Stair training;Gait training;Taping;Vasopneumatic Device;Iontophoresis '4mg'$ /ml Dexamethasone;Dry needling    PT Next Visit Plan work on Stage manager, functional strength and stability    Consulted and Agree with Plan of  Care Patient             Patient will benefit from skilled therapeutic intervention in order to improve the following deficits and impairments:  Abnormal gait, Increased fascial restricitons, Decreased activity tolerance, Pain, Impaired flexibility, Improper body mechanics, Hypomobility, Decreased balance, Decreased strength, Postural dysfunction  Visit Diagnosis: Acute pain of left knee  Other symptoms and signs involving the musculoskeletal system  Stiffness of left knee, not elsewhere classified     Problem List Patient Active Problem List   Diagnosis Date Noted   Bilateral arm pain 12/28/2020  Educated about COVID-19 virus infection 10/31/2019   Vitamin D deficiency 07/31/2019   Left knee pain 07/31/2019   Polycythemia 07/30/2018   Skin lesion 07/30/2018   Snoring 07/30/2018   Foot pain, bilateral 07/30/2018   Hyperglycemia 07/27/2018   Perimenopausal 04/20/2018   Depression with anxiety 04/20/2018   Dyslipidemia 10/17/2017   Situational anxiety 10/17/2017   Cervical cancer screening 10/14/2016   Urinary incontinence 10/28/2015   Absence of bladder continence 10/14/2015   Preventative health care 04/20/2015   History of chicken pox 04/10/2015   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 [place], [place], [address] Phone: [phone]   Fax:  [phone]  Name: SOLARA WHEATCROFT MRN: [id number] Date of Birth: 1966/01/25

## 2021-02-18 ENCOUNTER — Other Ambulatory Visit: Payer: Self-pay

## 2021-02-18 ENCOUNTER — Encounter: Payer: Self-pay | Admitting: Physical Therapy

## 2021-02-18 ENCOUNTER — Ambulatory Visit: Payer: BC Managed Care – PPO | Admitting: Physical Therapy

## 2021-02-18 DIAGNOSIS — R29898 Other symptoms and signs involving the musculoskeletal system: Secondary | ICD-10-CM | POA: Diagnosis not present

## 2021-02-18 DIAGNOSIS — M25562 Pain in left knee: Secondary | ICD-10-CM | POA: Diagnosis not present

## 2021-02-18 DIAGNOSIS — M25662 Stiffness of left knee, not elsewhere classified: Secondary | ICD-10-CM | POA: Diagnosis not present

## 2021-02-18 NOTE — Therapy (Signed)
[place]. [place], [place], [address] Phone: [phone]   Fax:  [phone]  Physical Therapy Treatment  Patient Details  Name: [place] Fox MRN: [id number] Date of Birth: December 03, 1965 Referring Provider (PT): Victorino December, MD   Encounter Date: 02/18/2021   PT End of Session - 02/18/21 0931     Visit Number 4    Number of Visits 7    Date for PT Re-Evaluation 03/11/21    Authorization Type BCBS    PT Start Time 0845    PT Stop Time 0929    PT Time Calculation (min) 44 min    Activity Tolerance Patient tolerated treatment well    Behavior During Therapy [facility] for tasks assessed/performed             Past Medical History:  Diagnosis Date   Dyslipidemia 10/17/2017   GERD (gastroesophageal reflux disease)    Headache(784.0)    migraine   History of chicken pox 04/10/2015   Hypertension    I 04/04/2014   Menorrhagia    Multiple renal cysts 08/09/2011   Murmur, cardiac    Echo WNL 2009   Obesity    Polycystic kidney disease    Preventative health care 04/20/2015   Situational anxiety 10/17/2017   Urinary incontinence 10/28/2015    Past Surgical History:  Procedure Laterality Date   ENDOMETRIAL BIOPSY  2010   KIDNEY CYST REMOVAL  2002   TONSILLECTOMY     URETERAL STENT PLACEMENT  2002    There were no vitals filed for this visit.   Subjective Assessment - 02/18/21 0849     Subjective Sunday and Monday had some severe pain in the R knee. L knee is still achey with activity    Pertinent History HLD, GERD, HA, HTN, urinary incontinence, L ACL tear    Limitations Standing;Walking;House hold activities;Lifting    Currently in Pain? No/denies                               [facility] Adult PT Treatment/Exercise - 02/18/21 0001       Knee/Hip Exercises: Aerobic   Nustep L5 x 6 min      Knee/Hip Exercises: Machines for Strengthening   Cybex Knee Flexion 35lb 2x10, LLE 20lb  2x10    Cybex Leg Press 40lb 2x10      Knee/Hip Exercises: Standing   Heel Raises Both;2 sets;10 reps;2 seconds    Step Down Left;2 sets;10 reps;Hand Hold: 1;Hand Hold: 2;Step Height: 4"   LLE eccentric   Walking with Sports Cord 30lb 4 way x 3 each      Knee/Hip Exercises: Seated   Sit to Sand 1 set;15 reps;without UE support                      PT Short Term Goals - 02/04/21 0846       PT SHORT TERM GOAL #1   Title Pt will be independent with her HEP    Time 3    Status Achieved    Target Date 02/18/21               PT Long Term Goals - 02/04/21 0846       PT LONG TERM GOAL #1   Title Patient to be independent with advanced HEP.    Time 6    Period Weeks    Status On-going  PT LONG TERM GOAL #2   Title Pt will demonstrate 5/5 strength in the L knee/ankle and Bil hips in order to demonstrate improved functional strength for decreased risk of injury related to ligament deficits.    Time 6    Period Weeks    Status On-going      PT LONG TERM GOAL #3   Title Patient to report tolerance for a full work day without pain.    Time 6    Period Weeks    Status On-going      PT LONG TERM GOAL #4   Title Patient to demonstrate alternating reciprocal pattern when ascending and descending stairs with good quad stability and 1 handrail as needed.    Time 6    Period Weeks    Status On-going                   Plan - 02/18/21 0931     Clinical Impression Statement Pt enters clinic reporting recent R knee ain with no known reason. Added additional LE strengthening interventions without issues. Some instability noted with resisted side  step. Pt reports weakness with LLE eccentric step downs. Good ROM and strength with seated Hs curls. Pt reports compliance with SLS activities at home    Personal Factors and Comorbidities Comorbidity 3+;Age;Fitness;Past/Current Experience;Profession;Time since onset of injury/illness/exacerbation    Comorbidities  HLD, GERD, HA, HTN, urinary incontinence, L ACL tear    Examination-Activity Limitations Bend;Sit;Carry;Squat;Stairs;Lift    Examination-Participation Restrictions [employer];Shop;Driving;Church;Meal Prep;Occupation;Cleaning    Stability/Clinical Decision Making Stable/Uncomplicated    Rehab Potential Good    PT Frequency 1x / week    PT Duration 6 weeks    PT Treatment/Interventions ADLs/Self Care Home Management;Moist Heat;Cryotherapy;Electrical Stimulation;Ultrasound;Manual techniques;Neuromuscular re-education;Patient/family education;Passive range of motion;Therapeutic exercise;Balance training;Therapeutic activities;Functional mobility training;Stair training;Gait training;Taping;Vasopneumatic Device;Iontophoresis '4mg'$ /ml Dexamethasone;Dry needling    PT Next Visit Plan work on Stage manager, functional strength and stability             Patient will benefit from skilled therapeutic intervention in order to improve the following deficits and impairments:  Abnormal gait, Increased fascial restricitons, Decreased activity tolerance, Pain, Impaired flexibility, Improper body mechanics, Hypomobility, Decreased balance, Decreased strength, Postural dysfunction  Visit Diagnosis: Acute pain of left knee  Other symptoms and signs involving the musculoskeletal system  Stiffness of left knee, not elsewhere classified     Problem List Patient Active Problem List   Diagnosis Date Noted   Bilateral arm pain 12/28/2020   Educated about COVID-19 virus infection 10/31/2019   Vitamin D deficiency 07/31/2019   Left knee pain 07/31/2019   Polycythemia 07/30/2018   Skin lesion 07/30/2018   Snoring 07/30/2018   Foot pain, bilateral 07/30/2018   Hyperglycemia 07/27/2018   Perimenopausal 04/20/2018   Depression with anxiety 04/20/2018   Dyslipidemia 10/17/2017   Situational anxiety 10/17/2017   Cervical cancer screening 10/14/2016   Urinary incontinence 10/28/2015   Absence  of bladder continence 10/14/2015   Preventative health care 04/20/2015   History of chicken pox 04/10/2015   Right leg DVT ([place]) 05/24/2014   DUB (dysfunctional uterine bleeding) 10/11/2013   Essential hypertension, benign 08/09/2011   GERD (gastroesophageal reflux disease) 08/09/2011   Migraine 08/09/2011   Multiple renal cysts 08/09/2011   Obesity 08/09/2011   H/O cardiac murmur 08/09/2011   Abnormal mammogram 08/09/2011    Scot Jun 02/18/2021, 9:38 AM  [place]. [place], [place], [address] Phone: [phone]  Fax:  [phone]  Name: YUSRA CHAVERS MRN: [id number] Date of Birth: 1965-08-25

## 2021-03-02 ENCOUNTER — Other Ambulatory Visit: Payer: Self-pay | Admitting: *Deleted

## 2021-03-02 DIAGNOSIS — R700 Elevated erythrocyte sedimentation rate: Secondary | ICD-10-CM

## 2021-03-04 ENCOUNTER — Other Ambulatory Visit: Payer: Self-pay

## 2021-03-04 ENCOUNTER — Ambulatory Visit: Payer: BC Managed Care – PPO | Attending: Orthopedic Surgery | Admitting: Physical Therapy

## 2021-03-04 ENCOUNTER — Encounter: Payer: Self-pay | Admitting: Physical Therapy

## 2021-03-04 DIAGNOSIS — F4323 Adjustment disorder with mixed anxiety and depressed mood: Secondary | ICD-10-CM | POA: Diagnosis not present

## 2021-03-04 DIAGNOSIS — M25662 Stiffness of left knee, not elsewhere classified: Secondary | ICD-10-CM | POA: Insufficient documentation

## 2021-03-04 DIAGNOSIS — R29898 Other symptoms and signs involving the musculoskeletal system: Secondary | ICD-10-CM | POA: Diagnosis not present

## 2021-03-04 DIAGNOSIS — M25562 Pain in left knee: Secondary | ICD-10-CM | POA: Insufficient documentation

## 2021-03-04 NOTE — Therapy (Signed)
[place] [place], [place], [address] Phone: [phone]   Fax:  [phone]  Physical Therapy Treatment  Patient Details  Name: [place] Fox MRN: [id number] Date of Birth: 1966-02-15 Referring Provider (PT): Victorino December, MD   Encounter Date: 03/04/2021   PT End of Session - 03/04/21 0841     Visit Number 5    Date for PT Re-Evaluation 03/11/21    PT Start Time 0800    PT Stop Time 0845    PT Time Calculation (min) 45 min    Activity Tolerance Patient tolerated treatment well    Behavior During Therapy [facility] for tasks assessed/performed             Past Medical History:  Diagnosis Date   Dyslipidemia 10/17/2017   GERD (gastroesophageal reflux disease)    Headache(784.0)    migraine   History of chicken pox 04/10/2015   Hypertension    I 04/04/2014   Menorrhagia    Multiple renal cysts 08/09/2011   Murmur, cardiac    Echo WNL 2009   Obesity    Polycystic kidney disease    Preventative health care 04/20/2015   Situational anxiety 10/17/2017   Urinary incontinence 10/28/2015    Past Surgical History:  Procedure Laterality Date   ENDOMETRIAL BIOPSY  2010   KIDNEY CYST REMOVAL  2002   TONSILLECTOMY     URETERAL STENT PLACEMENT  2002    There were no vitals filed for this visit.   Subjective Assessment - 03/04/21 0803     Subjective "went to the beach, no falls" doing ok but having some soreness    Currently in Pain? No/denies                               [facility] Adult PT Treatment/Exercise - 03/04/21 0001       Knee/Hip Exercises: Aerobic   Recumbent Bike Lvl 2.0  for 3 min    Nustep L5 x 6 min      Knee/Hip Exercises: Machines for Strengthening   Cybex Knee Extension LLE 5lb 2x10, 15lb LLE eccentrics 2x5    Cybex Knee Flexion 45lb 2x10, LLE 20lb 2x10    Cybex Leg Press 50lb 2x10      Knee/Hip Exercises: Standing   Heel Raises Both;2 sets;2 seconds;15 reps     Step Down Left;2 sets;10 reps;Hand Hold: 1;Hand Hold: 2;Step Height: 4"   eccentrics     Knee/Hip Exercises: Seated   Sit to Sand 2 sets;10 reps;without UE support                       PT Short Term Goals - 02/04/21 0846       PT SHORT TERM GOAL #1   Title Pt will be independent with her HEP    Time 3    Status Achieved    Target Date 02/18/21               PT Long Term Goals - 03/04/21 0840       PT LONG TERM GOAL #1   Title Patient to be independent with advanced HEP.    Status Partially Met      PT LONG TERM GOAL #2   Title Pt will demonstrate 5/5 strength in the L knee/ankle and Bil hips in order to demonstrate improved functional strength for decreased risk of injury related to  ligament deficits.      PT LONG TERM GOAL #3   Title Patient to report tolerance for a full work day without pain.    Status Achieved      PT LONG TERM GOAL #4   Title Patient to demonstrate alternating reciprocal pattern when ascending and descending stairs with good quad stability and 1 handrail as needed.    Status Partially Met                   Plan - 03/04/21 0841     Clinical Impression Statement Pt is progressing towards goals, Pt did well overall today increase load tolerated on leg press and with bilateral hamstring curls. L quad burning and fatigue reported with LLE eccentric step downs and machine level extensions. No pain reported throughout session. Some nee crepitus noted throughout session.    Personal Factors and Comorbidities Comorbidity 3+;Age;Fitness;Past/Current Experience;Profession;Time since onset of injury/illness/exacerbation    Comorbidities HLD, GERD, HA, HTN, urinary incontinence, L ACL tear    Examination-Activity Limitations Bend;Sit;Carry;Squat;Stairs;Lift    Examination-Participation Restrictions [employer];Shop;Driving;Church;Meal Prep;Occupation;Cleaning    Stability/Clinical Decision Making Stable/Uncomplicated    Rehab  Potential Good    PT Frequency 1x / week    PT Duration 6 weeks    PT Treatment/Interventions ADLs/Self Care Home Management;Moist Heat;Cryotherapy;Electrical Stimulation;Ultrasound;Manual techniques;Neuromuscular re-education;Patient/family education;Passive range of motion;Therapeutic exercise;Balance training;Therapeutic activities;Functional mobility training;Stair training;Gait training;Taping;Vasopneumatic Device;Iontophoresis 93m/ml Dexamethasone;Dry needling    PT Next Visit Plan work on sStage manager functional strength and stability             Patient will benefit from skilled therapeutic intervention in order to improve the following deficits and impairments:  Abnormal gait, Increased fascial restricitons, Decreased activity tolerance, Pain, Impaired flexibility, Improper body mechanics, Hypomobility, Decreased balance, Decreased strength, Postural dysfunction  Visit Diagnosis: Other symptoms and signs involving the musculoskeletal system  Stiffness of left knee, not elsewhere classified  Acute pain of left knee     Problem List Patient Active Problem List   Diagnosis Date Noted   Bilateral arm pain 12/28/2020   Educated about COVID-19 virus infection 10/31/2019   Vitamin D deficiency 07/31/2019   Left knee pain 07/31/2019   Polycythemia 07/30/2018   Skin lesion 07/30/2018   Snoring 07/30/2018   Foot pain, bilateral 07/30/2018   Hyperglycemia 07/27/2018   Perimenopausal 04/20/2018   Depression with anxiety 04/20/2018   Dyslipidemia 10/17/2017   Situational anxiety 10/17/2017   Cervical cancer screening 10/14/2016   Urinary incontinence 10/28/2015   Absence of bladder continence 10/14/2015   Preventative health care 04/20/2015   History of chicken pox 04/10/2015   [place]   DUB (dysfunctional uterine bleeding) 10/11/2013   Essential hypertension, benign 08/09/2011   GERD (gastroesophageal reflux disease) 08/09/2011   Migraine  08/09/2011   Multiple renal cysts 08/09/2011   Obesity 08/09/2011   H/O cardiac murmur 08/09/2011   Abnormal mammogram 08/09/2011    RScot Jun PTA 03/04/2021, 8:44 AM  [place] [id number]: [phone]  Fax:  [phone] Name: Kim KNAPEMRN: [id number] of Birth: 1January 29, 1967

## 2021-03-06 ENCOUNTER — Other Ambulatory Visit (INDEPENDENT_AMBULATORY_CARE_PROVIDER_SITE_OTHER): Payer: BC Managed Care – PPO

## 2021-03-06 ENCOUNTER — Other Ambulatory Visit: Payer: Self-pay

## 2021-03-06 DIAGNOSIS — R739 Hyperglycemia, unspecified: Secondary | ICD-10-CM

## 2021-03-06 DIAGNOSIS — I10 Essential (primary) hypertension: Secondary | ICD-10-CM

## 2021-03-06 DIAGNOSIS — E785 Hyperlipidemia, unspecified: Secondary | ICD-10-CM | POA: Diagnosis not present

## 2021-03-06 DIAGNOSIS — R700 Elevated erythrocyte sedimentation rate: Secondary | ICD-10-CM | POA: Diagnosis not present

## 2021-03-06 LAB — COMPREHENSIVE METABOLIC PANEL
ALT: 21 U/L (ref 0–35)
AST: 26 U/L (ref 0–37)
Albumin: 3.8 g/dL (ref 3.5–5.2)
Alkaline Phosphatase: 82 U/L (ref 39–117)
BUN: 16 mg/dL (ref 6–23)
CO2: 27 mEq/L (ref 19–32)
Calcium: 9 mg/dL (ref 8.4–10.5)
Chloride: 103 mEq/L (ref 96–112)
Creatinine, Ser: 0.96 mg/dL (ref 0.40–1.20)
GFR: 67.41 mL/min (ref >60.00)
Glucose, Bld: 104 mg/dL — ABNORMAL HIGH (ref 70–99)
Potassium: 4.2 mEq/L (ref 3.5–5.1)
Sodium: 138 mEq/L (ref 135–145)
Total Bilirubin: 0.6 mg/dL (ref 0.2–1.2)
Total Protein: 7.1 g/dL (ref 6.0–8.3)

## 2021-03-06 LAB — CBC WITH DIFFERENTIAL/PLATELET
Basophils Absolute: 0.1 10*3/uL (ref 0.0–0.1)
Basophils Relative: 1.1 % (ref 0.0–3.0)
Eosinophils Absolute: 0.1 10*3/uL (ref 0.0–0.7)
Eosinophils Relative: 2.1 % (ref 0.0–5.0)
HCT: 47 % — ABNORMAL HIGH (ref 36.0–46.0)
Hemoglobin: 15.2 g/dL — ABNORMAL HIGH (ref 12.0–15.0)
Lymphocytes Relative: 32.8 % (ref 12.0–46.0)
Lymphs Abs: 2.2 10*3/uL (ref 0.7–4.0)
MCHC: 32.3 g/dL (ref 30.0–36.0)
MCV: 88.1 fl (ref 78.0–100.0)
Monocytes Absolute: 0.6 10*3/uL (ref 0.1–1.0)
Monocytes Relative: 9.4 % (ref 3.0–12.0)
Neutro Abs: 3.7 10*3/uL (ref 1.4–7.7)
Neutrophils Relative %: 54.6 % (ref 43.0–77.0)
Platelets: 222 10*3/uL (ref 150.0–400.0)
RBC: 5.33 Mil/uL — ABNORMAL HIGH (ref 3.87–5.11)
RDW: 14.6 % (ref 11.5–15.5)
WBC: 6.8 10*3/uL (ref 4.0–10.5)

## 2021-03-06 LAB — LIPID PANEL
Cholesterol: 187 mg/dL (ref 0–200)
HDL: 54.1 mg/dL (ref >39.00)
LDL Cholesterol: 104 mg/dL — ABNORMAL HIGH (ref 0–99)
NonHDL: 133.13
Total CHOL/HDL Ratio: 3
Triglycerides: 145 mg/dL (ref 0.0–149.0)
VLDL: 29 mg/dL (ref 0.0–40.0)

## 2021-03-06 LAB — SEDIMENTATION RATE: Sed Rate: 25 mm/hr (ref 0–30)

## 2021-03-06 LAB — TSH: TSH: 1.07 u[IU]/mL (ref 0.35–5.50)

## 2021-03-06 LAB — HEMOGLOBIN A1C: Hgb A1c MFr Bld: 6.1 % (ref 4.6–6.5)

## 2021-03-06 NOTE — Addendum Note (Signed)
Addended by: Manuela Schwartz on: 03/06/2021 09:15 AM   Modules accepted: Orders

## 2021-03-10 ENCOUNTER — Other Ambulatory Visit: Payer: BC Managed Care – PPO

## 2021-03-17 ENCOUNTER — Other Ambulatory Visit (HOSPITAL_COMMUNITY)
Admission: RE | Admit: 2021-03-17 | Discharge: 2021-03-17 | Disposition: A | Discharge status: Home / Self Care | Payer: BC Managed Care – PPO | Source: Ambulatory Visit | Attending: Family Medicine | Admitting: Family Medicine

## 2021-03-17 ENCOUNTER — Other Ambulatory Visit: Payer: Self-pay

## 2021-03-17 ENCOUNTER — Ambulatory Visit (INDEPENDENT_AMBULATORY_CARE_PROVIDER_SITE_OTHER): Payer: BC Managed Care – PPO | Admitting: Family Medicine

## 2021-03-17 VITALS — BP 122/74 | HR 65 | Temp 97.8°F | Resp 16 | Ht 67.5 in | Wt 284.0 lb

## 2021-03-17 DIAGNOSIS — M21611 Bunion of right foot: Secondary | ICD-10-CM | POA: Diagnosis not present

## 2021-03-17 DIAGNOSIS — Z1211 Encounter for screening for malignant neoplasm of colon: Secondary | ICD-10-CM

## 2021-03-17 DIAGNOSIS — Z0000 Encounter for general adult medical examination without abnormal findings: Secondary | ICD-10-CM

## 2021-03-17 DIAGNOSIS — M79601 Pain in right arm: Secondary | ICD-10-CM

## 2021-03-17 DIAGNOSIS — E785 Hyperlipidemia, unspecified: Secondary | ICD-10-CM

## 2021-03-17 DIAGNOSIS — R739 Hyperglycemia, unspecified: Secondary | ICD-10-CM

## 2021-03-17 DIAGNOSIS — E6609 Other obesity due to excess calories: Secondary | ICD-10-CM | POA: Diagnosis not present

## 2021-03-17 DIAGNOSIS — M21612 Bunion of left foot: Secondary | ICD-10-CM

## 2021-03-17 DIAGNOSIS — I10 Essential (primary) hypertension: Secondary | ICD-10-CM

## 2021-03-17 DIAGNOSIS — Z124 Encounter for screening for malignant neoplasm of cervix: Secondary | ICD-10-CM | POA: Diagnosis not present

## 2021-03-17 DIAGNOSIS — M79602 Pain in left arm: Secondary | ICD-10-CM

## 2021-03-17 DIAGNOSIS — E559 Vitamin D deficiency, unspecified: Secondary | ICD-10-CM

## 2021-03-17 MED ORDER — ATENOLOL 50 MG PO TABS: 50.0000 mg | ORAL_TABLET | Freq: Every day | ORAL | 1 refills | Status: DC

## 2021-03-17 NOTE — Patient Instructions (Addendum)
Paxlovid or Molnupiravir is the new COVID medication we can give you if you get COVID so make sure you test if you have symptoms because we have to treat by day 5 of symptoms for it to be effective. If you are positive let us know so we can treat. If a home test is negative and your symptoms are persistent get a PCR test. Can check testing locations at [email or web address] If you are positive we will make an appointment with [place] and we will send in Paxlovid or Molnupiravir if you would like it. Check with your pharmacy before we meet to confirm they have it in stock, if they do not then we can get the prescription at the [place] 33-46 Years Old, Female   Kim Fox once weekly or Saxenda once daily check regarding coverage and let us know if you want start Shingrix is the new shingles shot, 2 shots over 2-6 months, confirm coverage with insurance and document, then can return here for shots with nurse appt or at pharmacy  Preventive care refers to lifestyle choices and visits with your health care provider that can promote health and wellness. This includes: A yearly physical exam. This is also called an annual wellness visit. Regular dental and eye exams. Immunizations. Screening for certain conditions. Healthy lifestyle choices, such as: Eating a healthy diet. Getting regular exercise. Not using drugs or products that contain nicotine and tobacco. Limiting alcohol use. What can I expect for my preventive care visit? Physical exam Your health care provider will check your: Height and weight. These may be used to calculate your BMI (body mass index). BMI is a measurement that tells if you are at a healthy weight. Heart rate and blood pressure. Body temperature. Skin for abnormal spots. Counseling Your health care provider may ask you questions about your: Past medical problems. Family's medical history. Alcohol, tobacco, and drug use. Emotional  well-being. Home life and relationship well-being. Sexual activity. Diet, exercise, and sleep habits. Work and work Statistician. Access to firearms. Method of birth control. Menstrual cycle. Pregnancy history. What immunizations do I need? Vaccines are usually given at various ages, according to a schedule. Your health care provider will recommend vaccines for you based on your age, medical history, and lifestyle or other factors, such as travel or where you work. What tests do I need? Blood tests Lipid and cholesterol levels. These may be checked every 5 years, or more often if you are over 54 years old. Hepatitis C test. Hepatitis B test. Screening Lung cancer screening. You may have this screening every year starting at age 54 if you have a 30-pack-year history of smoking and currently smoke or have quit within the past 15 years. Colorectal cancer screening. All adults should have this screening starting at age 54 and continuing until age 39. Your health care provider may recommend screening at age 54 if you are at increased risk. You will have tests every 1-10 years, depending on your results and the type of screening test. Diabetes screening. This is done by checking your blood sugar (glucose) after you have not eaten for a while (fasting). You may have this done every 1-3 years. Mammogram. This may be done every 1-2 years. Talk with your health care provider about when you should start having regular mammograms. This may depend on whether you have a family history of breast cancer. BRCA-related cancer screening. This may be done if you have a family history of breast, ovarian,  tubal, or peritoneal cancers. Pelvic exam and Pap test. This may be done every 3 years starting at age 54. Starting at age 54, this may be done every 5 years if you have a Pap test in combination with an HPV test. Other tests STD (sexually transmitted disease) testing, if you are at risk. Bone density  scan. This is done to screen for osteoporosis. You may have this scan if you are at high risk for osteoporosis. Talk with your health care provider about your test results, treatment options, and if necessary, the need for more tests. Follow these instructions at home: Eating and drinking  Eat a diet that includes fresh fruits and vegetables, whole grains, lean protein, and low-fat dairy products. Take vitamin and mineral supplements as recommended by your health care provider. Do not drink alcohol if: Your health care provider tells you not to drink. You are pregnant, may be pregnant, or are planning to become pregnant. If you drink alcohol: Limit how much you have to 0-1 drink a day. Be aware of how much alcohol is in your drink. In the [place] one drink equals one 12 oz bottle of beer (355 mL), one 5 oz glass of wine (148 mL), or one 1 oz glass of hard liquor (44 mL). Lifestyle Take daily care of your teeth and gums. Brush your teeth every morning and night with fluoride toothpaste. Floss one time each day. Stay active. Exercise for at least 30 minutes 5 or more days each week. Do not use any products that contain nicotine or tobacco, such as cigarettes, e-cigarettes, and chewing tobacco. If you need help quitting, ask your health care provider. Do not use drugs. If you are sexually active, practice safe sex. Use a condom or other form of protection to prevent STIs (sexually transmitted infections). If you do not wish to become pregnant, use a form of birth control. If you plan to become pregnant, see your health care provider for a prepregnancy visit. If told by your health care provider, take low-dose aspirin daily starting at age 54. Find healthy ways to cope with stress, such as: Meditation, yoga, or listening to music. Journaling. Talking to a trusted person. Spending time with friends and family. Safety Always wear your seat belt while driving or riding in a vehicle. Do not  drive: If you have been drinking alcohol. Do not ride with someone who has been drinking. When you are tired or distracted. While texting. Wear a helmet and other protective equipment during sports activities. If you have firearms in your house, make sure you follow all gun safety procedures. What's next? Visit your health care provider once a year for an annual wellness visit. Ask your health care provider how often you should have your eyes and teeth checked. Stay up to date on all vaccines. This information is not intended to replace advice given to you by your health care provider. Make sure you discuss any questions you have with your health care provider. Document Revised: 08/15/2020 Document Reviewed: 02/16/2018 Elsevier Patient Education  2022 [employer].

## 2021-03-17 NOTE — Progress Notes (Signed)
Patient ID: Kim Fox, female    DOB: [birth year]  Age: 55 y.o. MRN: [id number]    Subjective:   No chief complaint on file.  Subjective   HPI Kim Fox presents for office visit today for follow up on HTN and migraines. Her bilateral wrist pain has improved since using wrist splints, icing, and lidocaine patches. Her left elbow is still in pain, but that was possibly due to a fall unrelated to her wrist pain and it is also improving. Denies CP/palp/SOB/HA/congestion/fevers/GI or GU c/o. Taking meds as prescribed.  She reports that she finished her pt for her left knee and notes that it improved, but still experiences crackling. Her bunion on right foot local to both her lateral and medial aspects are more prominent than her left foot.    Review of Systems  Constitutional:  Negative for chills, fatigue and fever.  HENT:  Negative for congestion, rhinorrhea, sinus pressure, sinus pain and sore throat.   Eyes:  Negative for pain.  Respiratory:  Negative for cough and shortness of breath.   Cardiovascular:  Negative for chest pain, palpitations and leg swelling.  Gastrointestinal:  Negative for abdominal pain, blood in stool, diarrhea, nausea and vomiting.  Genitourinary:  Negative for decreased urine volume, flank pain, frequency, vaginal bleeding and vaginal discharge.  Musculoskeletal:  Negative for back pain.  Neurological:  Negative for headaches.   History Past Medical History:  Diagnosis Date   Dyslipidemia 10/17/2017   GERD (gastroesophageal reflux disease)    Headache(784.0)    migraine   History of chicken pox 04/10/2015   Hypertension    I 04/04/2014   Menorrhagia    Multiple renal cysts 08/09/2011   Murmur, cardiac    Echo WNL 2009   Obesity    Polycystic kidney disease    Preventative health care 04/20/2015   Situational anxiety 10/17/2017   Urinary incontinence 10/28/2015    She has a past surgical history that includes Tonsillectomy; Ureteral  stent placement (2002); Kidney cyst removal (2002); and Endometrial biopsy (2010).   Her family history includes Allergies in her sister; Arthritis in her father and mother; Cancer in her paternal grandfather, paternal grandmother, and sister; Diabetes in her father and mother; Dysfunctional uterine bleeding in her sister and sister; Fibromyalgia in her sister; Heart disease in her paternal grandfather; Hyperlipidemia in her daughter; Hypertension in her father, mother, and sister; Kidney disease in her father; Migraines in her mother and sister; Renal Cyst in her sister and sister; Varicose Veins in her mother.She reports that she has never smoked. She has never used smokeless tobacco. She reports current alcohol use. She reports that she does not use drugs.  Current Outpatient Medications on File Prior to Visit  Medication Sig Dispense Refill   Ascorbic Acid (VITAMIN C) 1000 MG tablet Take 1,000 mg by mouth daily.     aspirin EC 81 MG tablet Take 81 mg by mouth 2 (two) times daily.     Biotin 5000 MCG CAPS Take 1 capsule by mouth daily.     Calcium Carbonate-Vitamin D (CALCIUM-VITAMIN D) 600-200 MG-UNIT CAPS Take 1 tablet by mouth 2 (two) times daily.     famotidine (PEPCID) 40 MG tablet TAKE 1/2 TO 1 TABLET BY MOUTH EVERY NIGHT AT BEDTIME AS NEEDED FOR HEARTBURN OR INDIGESTION 90 tablet 2   FLUoxetine (PROZAC) 20 MG tablet TAKE 1 TABLET(20 MG) BY MOUTH DAILY 90 tablet 1   folic acid (FOLVITE) 106 MCG tablet Take 400 mcg by mouth  daily.     L-Lysine 1000 MG TABS Take 1 tablet by mouth daily.     Lidocaine HCl (ASPERCREME LIDOCAINE) 4 % CREA Apply 1 Dose topically 2 (two) times daily as needed. 133 g 11   Multiple Vitamin (MULTIVITAMIN) tablet Take 1 tablet by mouth daily.     Omega 3-6-9 Fatty Acids (OMEGA 3-6-9 COMPLEX PO) Omega 3-6-9     Probiotic Product (ACIDOPHILUS/GOAT MILK) CAPS Take by mouth.     vitamin B-12 (CYANOCOBALAMIN) 500 MCG tablet Take 500 mcg by mouth daily.     VITAMIN D PO  Take by mouth.     No current facility-administered medications on file prior to visit.     Objective:  Objective  Physical Exam Constitutional:      General: She is not in acute distress.    Appearance: Normal appearance. She is not ill-appearing or toxic-appearing.  HENT:     Head: Normocephalic and atraumatic.     Right Ear: Tympanic membrane, ear canal and external ear normal.     Left Ear: Tympanic membrane, ear canal and external ear normal.     Nose: No congestion or rhinorrhea.  Eyes:     Extraocular Movements: Extraocular movements intact.     Pupils: Pupils are equal, round, and reactive to light.  Cardiovascular:     Rate and Rhythm: Normal rate and regular rhythm.     Pulses: Normal pulses.     Heart sounds: Normal heart sounds. No murmur heard. Pulmonary:     Effort: Pulmonary effort is normal. No respiratory distress.     Breath sounds: Normal breath sounds. No wheezing, rhonchi or rales.  Abdominal:     General: Bowel sounds are normal.     Palpations: Abdomen is soft. There is no mass.     Tenderness: There is no abdominal tenderness. There is no guarding.     Hernia: No hernia is present.  Genitourinary:    General: Normal vulva.     Rectum: Guaiac result negative.  Musculoskeletal:        General: Normal range of motion.     Cervical back: Normal range of motion and neck supple.  Skin:    General: Skin is warm and dry.  Neurological:     Mental Status: She is alert and oriented to person, place, and time.  Psychiatric:        Behavior: Behavior normal.   BP 122/74   Pulse 65   Temp 97.8 F (36.6 C)   Resp 16   Ht 5' 7.5" (1.715 m)   Wt 284 lb (128.8 kg)   SpO2 93%   BMI 43.82 kg/m  Wt Readings from Last 3 Encounters:  03/17/21 284 lb (128.8 kg)  02/28/20 293 lb (132.9 kg)  09/14/19 282 lb 6 oz (128.1 kg)     Lab Results  Component Value Date   WBC 6.8 03/06/2021   HGB 15.2 (H) 03/06/2021   HCT 47.0 (H) 03/06/2021   PLT 222.0  03/06/2021   GLUCOSE 104 (H) 03/06/2021   CHOL 187 03/06/2021   TRIG 145.0 03/06/2021   HDL 54.10 03/06/2021   LDLDIRECT 117.0 07/27/2018   LDLCALC 104 (H) 03/06/2021   ALT 21 03/06/2021   AST 26 03/06/2021   NA 138 03/06/2021   K 4.2 03/06/2021   CL 103 03/06/2021   CREATININE 0.96 03/06/2021   BUN 16 03/06/2021   CO2 27 03/06/2021   TSH 1.07 03/06/2021   INR 1.03 03/25/2014   HGBA1C  6.1 03/06/2021   MICROALBUR 1.0 10/17/2017    MM 3D SCREEN BREAST BILATERAL  Result Date: 04/03/2020 CLINICAL DATA:  Screening. EXAM: DIGITAL SCREENING BILATERAL MAMMOGRAM WITH TOMO AND CAD COMPARISON:  Previous exam(s). ACR Breast Density Category b: There are scattered areas of fibroglandular density. FINDINGS: There are no findings suspicious for malignancy. Images were processed with CAD. IMPRESSION: No mammographic evidence of malignancy. A result letter of this screening mammogram will be mailed directly to the patient. RECOMMENDATION: Screening mammogram in one year. (Code:SM-B-01Y) BI-RADS CATEGORY  1: Negative. Electronically Signed   By: Everlean Alstrom M.D.   On: 04/03/2020 14:03     Assessment & Plan:  Plan    Meds ordered this encounter  Medications   atenolol (TENORMIN) 50 MG tablet    Sig: Take 1 tablet (50 mg total) by mouth daily.    Dispense:  90 tablet    Refill:  1     Problem List Items Addressed This Visit     Essential hypertension, benign    Well controlled, no changes to meds. Encouraged heart healthy diet such as the DASH diet and exercise as tolerated.       Relevant Medications   atenolol (TENORMIN) 50 MG tablet   Obesity    Encouraged DASH or MIND diet, decrease po intake and increase exercise as tolerated. Needs 7-8 hours of sleep nightly. Avoid trans fats, eat small, frequent meals every 4-5 hours with lean proteins, complex carbs and healthy fats. Minimize simple carbs, high fat foods and processed foods. Consider Wegovy or Saxenda      Preventative  health care    Patient encouraged to maintain heart healthy diet, regular exercise, adequate sleep. Consider daily probiotics. Take medications as prescribed. Labs reviewed with patient. Pap performed today. Referred for colonoscopy discussed immunizations but she declines for today. MGM done October 2021 due next month      Cervical cancer screening - Primary    Pap today, no concerns on exam.       Relevant Orders   Cytology - PAP( [facility]) (Completed)   Dyslipidemia    Encourage heart healthy diet such as MIND or DASH diet, increase exercise, avoid trans fats, simple carbohydrates and processed foods, consider a krill or fish or flaxseed oil cap daily.       Hyperglycemia    hgba1c acceptable, minimize simple carbs. Increase exercise as tolerated.       Vitamin D deficiency    Supplement and monitor      Bilateral arm pain    Bilateral wrists better with splinting, icing and lidocaine patches but left elbow still painful. Encouraged moist heat and gentle stretching as tolerated. May try NSAIDs and prescription meds as directed and report if symptoms worsen or seek immediate care. May need f/u with ortho or sports med      Bilateral bunions    Referred to podiatry for consideration of options.       Relevant Orders   Ambulatory referral to Podiatry   Other Visit Diagnoses     Colon cancer screening       Relevant Orders   Ambulatory referral to Gastroenterology       Follow-up: Return in about 4 months (around 07/17/2021).  I, Suezanne Jacquet, acting as a scribe for Penni Homans, MD, have documented all relevent documentation on behalf of Penni Homans, MD, as directed by Penni Homans, MD while in the presence of Penni Homans, MD. DO:03/20/21.  I, [name], MD  personally performed the services described in this documentation. All medical record entries made by the scribe were at my direction and in my presence. I have reviewed the chart and agree that the record  reflects my personal performance and is accurate and complete

## 2021-03-19 LAB — CYTOLOGY - PAP
Adequacy: ABSENT
Diagnosis: NEGATIVE

## 2021-03-20 DIAGNOSIS — M21611 Bunion of right foot: Secondary | ICD-10-CM | POA: Insufficient documentation

## 2021-03-20 NOTE — Assessment & Plan Note (Signed)
Referred to podiatry for consideration of options.

## 2021-03-20 NOTE — Assessment & Plan Note (Signed)
Bilateral wrists better with splinting, icing and lidocaine patches but left elbow still painful. Encouraged moist heat and gentle stretching as tolerated. May try NSAIDs and prescription meds as directed and report if symptoms worsen or seek immediate care. May need f/u with ortho or sports med

## 2021-03-20 NOTE — Assessment & Plan Note (Signed)
Encourage heart healthy diet such as MIND or DASH diet, increase exercise, avoid trans fats, simple carbohydrates and processed foods, consider a krill or fish or flaxseed oil cap daily.  °

## 2021-03-20 NOTE — Assessment & Plan Note (Signed)
Patient encouraged to maintain heart healthy diet, regular exercise, adequate sleep. Consider daily probiotics. Take medications as prescribed. Labs reviewed with patient. Pap performed today. Referred for colonoscopy discussed immunizations but she declines for today. MGM done October 2021 due next month

## 2021-03-20 NOTE — Assessment & Plan Note (Signed)
Supplement and monitor 

## 2021-03-20 NOTE — Assessment & Plan Note (Signed)
Pap today, no concerns on exam.  

## 2021-03-20 NOTE — Assessment & Plan Note (Signed)
Well controlled, no changes to meds. Encouraged heart healthy diet such as the DASH diet and exercise as tolerated.  °

## 2021-03-20 NOTE — Assessment & Plan Note (Signed)
Encouraged DASH or MIND diet, decrease po intake and increase exercise as tolerated. Needs 7-8 hours of sleep nightly. Avoid trans fats, eat small, frequent meals every 4-5 hours with lean proteins, complex carbs and healthy fats. Minimize simple carbs, high fat foods and processed foods. Consider Wegovy or Saxenda °

## 2021-03-20 NOTE — Assessment & Plan Note (Signed)
hgba1c acceptable, minimize simple carbs. Increase exercise as tolerated.  

## 2021-03-24 DIAGNOSIS — S83412D Sprain of medial collateral ligament of left knee, subsequent encounter: Secondary | ICD-10-CM | POA: Diagnosis not present

## 2021-04-01 DIAGNOSIS — F4323 Adjustment disorder with mixed anxiety and depressed mood: Secondary | ICD-10-CM | POA: Diagnosis not present

## 2021-04-02 ENCOUNTER — Other Ambulatory Visit: Payer: Self-pay | Admitting: Family Medicine

## 2021-04-07 ENCOUNTER — Encounter: Payer: Self-pay | Admitting: Family Medicine

## 2021-04-08 ENCOUNTER — Ambulatory Visit (INDEPENDENT_AMBULATORY_CARE_PROVIDER_SITE_OTHER): Payer: BC Managed Care – PPO

## 2021-04-08 ENCOUNTER — Other Ambulatory Visit: Payer: Self-pay

## 2021-04-08 DIAGNOSIS — Z23 Encounter for immunization: Secondary | ICD-10-CM

## 2021-04-09 ENCOUNTER — Ambulatory Visit (INDEPENDENT_AMBULATORY_CARE_PROVIDER_SITE_OTHER): Payer: BC Managed Care – PPO

## 2021-04-09 ENCOUNTER — Other Ambulatory Visit: Payer: Self-pay

## 2021-04-09 ENCOUNTER — Encounter: Payer: Self-pay | Admitting: Podiatry

## 2021-04-09 ENCOUNTER — Ambulatory Visit: Payer: BC Managed Care – PPO | Admitting: Podiatry

## 2021-04-09 DIAGNOSIS — M2011 Hallux valgus (acquired), right foot: Secondary | ICD-10-CM

## 2021-04-09 DIAGNOSIS — L603 Nail dystrophy: Secondary | ICD-10-CM | POA: Diagnosis not present

## 2021-04-09 DIAGNOSIS — M2010 Hallux valgus (acquired), unspecified foot: Secondary | ICD-10-CM

## 2021-04-09 DIAGNOSIS — L608 Other nail disorders: Secondary | ICD-10-CM | POA: Diagnosis not present

## 2021-04-09 DIAGNOSIS — M2012 Hallux valgus (acquired), left foot: Secondary | ICD-10-CM

## 2021-04-09 MED ORDER — FLUOXETINE HCL 20 MG PO TABS: ORAL_TABLET | ORAL | 1 refills | Status: DC

## 2021-04-09 NOTE — Progress Notes (Signed)
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 female presents with the above complaint.   ROS: Denies fever chills nausea vomiting muscle aches pains calf pain back pain chest pain shortness of breath.  Past Medical History:  Diagnosis Date   Dyslipidemia 10/17/2017   GERD (gastroesophageal reflux disease)    Headache(784.0)    migraine   History of chicken pox 04/10/2015   Hypertension    I 04/04/2014   Menorrhagia    Multiple renal cysts 08/09/2011   Murmur, cardiac    Echo WNL 2009   Obesity    Polycystic kidney disease    Preventative health care 04/20/2015   Situational anxiety 10/17/2017   Urinary incontinence 10/28/2015   Past Surgical History:  Procedure Laterality Date   ENDOMETRIAL BIOPSY  2010   KIDNEY CYST REMOVAL  2002   TONSILLECTOMY     URETERAL STENT PLACEMENT  2002    Current Outpatient Medications:    Ascorbic Acid (VITAMIN C) 1000 MG tablet, Take 1,000 mg by mouth daily., Disp: , Rfl:    aspirin EC 81 MG tablet, Take 81 mg by mouth 2 (two) times daily., Disp: , Rfl:    atenolol (TENORMIN) 50 MG tablet, Take 1 tablet (50 mg total) by mouth daily., Disp: 90 tablet, Rfl: 1   Biotin 5000 MCG CAPS, Take 1 capsule by mouth daily., Disp: , Rfl:    Calcium Carbonate-Vitamin D (CALCIUM-VITAMIN D) 600-200 MG-UNIT CAPS, Take 1 tablet by mouth 2 (two) times daily., Disp: , Rfl:    famotidine (PEPCID) 40 MG tablet, TAKE 1/2 TO 1 TABLET BY MOUTH EVERY NIGHT AT BEDTIME AS NEEDED FOR HEARTBURN OR INDIGESTION, Disp: 90 tablet, Rfl: 2   FLUoxetine (PROZAC) 20 MG tablet, TAKE 1  TABLET(20 MG) BY MOUTH DAILY, Disp: 90 tablet, Rfl: 1   folic acid (FOLVITE) 097 MCG tablet, Take 400 mcg by mouth daily., Disp: , Rfl:    L-Lysine 1000 MG TABS, Take 1 tablet by mouth daily., Disp: , Rfl:    Lidocaine HCl (ASPERCREME LIDOCAINE) 4 % CREA, Apply 1 Dose topically 2 (two) times daily as needed., Disp: 133 g, Rfl: 11   Multiple Vitamin (MULTIVITAMIN) tablet, Take 1 tablet by mouth daily., Disp: , Rfl:    Omega 3-6-9 Fatty Acids (OMEGA 3-6-9 COMPLEX PO), Omega 3-6-9, Disp: , Rfl:    Probiotic Product (ACIDOPHILUS/GOAT MILK) CAPS, Take by mouth., Disp: , Rfl:    vitamin B-12 (CYANOCOBALAMIN) 500 MCG tablet, Take 500 mcg by mouth daily., Disp: , Rfl:    VITAMIN D PO, Take by mouth., Disp: , Rfl:   Allergies  Allergen Reactions   Nitrofurantoin Rash   Macrodantin Rash   Penicillins Rash   Review of Systems Objective:  There were no vitals filed for this visit.  General: Well developed, nourished, in no acute distress, alert and oriented x3   Dermatological: Skin is warm, dry and supple bilateral. Nails x 10 are well maintained; remaining integument appears unremarkable at this time. There are no open sores, no preulcerative lesions,  no rash or signs of infection present.  Small porokeratotic lesion benign skin lesion plantar aspect of the forefoot right.  Toenails are thick yellow dystrophic possibly mycotic  Vascular: Dorsalis Pedis artery and Posterior Tibial artery pedal pulses are 2/4 bilateral with immedate capillary fill time. Pedal hair growth present. No varicosities and no lower extremity edema present bilateral.   Neruologic: Grossly intact via light touch bilateral. Vibratory intact via tuning fork bilateral. Protective threshold with Semmes Wienstein monofilament intact to all pedal sites bilateral. Patellar and Achilles deep tendon reflexes 2+ bilateral. No Babinski or clonus noted bilateral.   Musculoskeletal: No gross boney pedal deformities bilateral. No pain,  crepitus, or limitation noted with foot and ankle range of motion bilateral. Muscular strength 5/5 in all groups tested bilateral.  Moderate hallux abductovalgus deformities bilateral right greater than left no limitation in range of motion no restriction.  Significant tailor's bunion deformity with bursitis on the right foot none on the left.  Gait: Unassisted, Nonantalgic.    Radiographs:  Radiographs of the bilateral foot today demonstrate an osseously mature individual.  Right foot demonstrates no acute findings other than a soft tissue increase in density around the lateral aspect of the fifth metatarsal head right with lateral deviation of the fifth metatarsal consistent with tailor's bunion deformity.  Hallux abductovalgus deformity is noted as well bilaterally but does not demonstrate severe deformity.  Does not demonstrate severe osteoarthritic changes.  She also has an adductovarus rotated fourth toe bilateral.  Assessment & Plan:   Assessment: Hallux abductovalgus deformity bilateral.  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  Pain in limb secondary to possible nail dystrophy or onychomycosis.  And benign skin lesion.    Plan: Samples of this skin and nails were taken today for pathologic evaluation.  We will follow-up with her in 1 month for that.  At the same time we will consent her for surgical intervention regarding that right foot a Austin bunion repair and 1/5 met osteotomy with double screw fixation.  I debrided the benign skin lesions today..  Follow-up with her in 1 month for pathology and consent.     Melchor Kirchgessner T. [place], [place]

## 2021-04-10 ENCOUNTER — Telehealth: Payer: Self-pay

## 2021-04-10 ENCOUNTER — Encounter: Payer: Self-pay | Admitting: Podiatry

## 2021-04-10 ENCOUNTER — Encounter: Payer: Self-pay | Admitting: Family Medicine

## 2021-04-10 NOTE — Telephone Encounter (Signed)
Prior auth started  Fluoxetine HCI 20 mg Key: [id number]   Prior auth aprroved

## 2021-04-22 ENCOUNTER — Other Ambulatory Visit (HOSPITAL_BASED_OUTPATIENT_CLINIC_OR_DEPARTMENT_OTHER): Payer: Self-pay | Admitting: Family Medicine

## 2021-04-22 DIAGNOSIS — Z1231 Encounter for screening mammogram for malignant neoplasm of breast: Secondary | ICD-10-CM

## 2021-04-29 DIAGNOSIS — F4323 Adjustment disorder with mixed anxiety and depressed mood: Secondary | ICD-10-CM | POA: Diagnosis not present

## 2021-05-07 ENCOUNTER — Other Ambulatory Visit: Payer: Self-pay

## 2021-05-07 ENCOUNTER — Encounter: Payer: Self-pay | Admitting: Podiatry

## 2021-05-07 ENCOUNTER — Ambulatory Visit: Payer: BC Managed Care – PPO | Admitting: Podiatry

## 2021-05-07 DIAGNOSIS — L821 Other seborrheic keratosis: Secondary | ICD-10-CM | POA: Insufficient documentation

## 2021-05-07 DIAGNOSIS — M2011 Hallux valgus (acquired), right foot: Secondary | ICD-10-CM | POA: Diagnosis not present

## 2021-05-07 DIAGNOSIS — M21621 Bunionette of right foot: Secondary | ICD-10-CM

## 2021-05-07 DIAGNOSIS — L603 Nail dystrophy: Secondary | ICD-10-CM | POA: Diagnosis not present

## 2021-05-07 DIAGNOSIS — Z79899 Other long term (current) drug therapy: Secondary | ICD-10-CM

## 2021-05-07 MED ORDER — ITRACONAZOLE 100 MG PO CAPS: 100.0000 mg | ORAL_CAPSULE | Freq: Every day | ORAL | 0 refills | Status: DC

## 2021-05-07 MED ORDER — ITRACONAZOLE 100 MG PO CAPS: 100.0000 mg | ORAL_CAPSULE | Freq: Two times a day (BID) | ORAL | 0 refills | Status: DC

## 2021-05-07 MED ORDER — ITRACONAZOLE 100 MG PO CAPS: ORAL_CAPSULE | ORAL | 0 refills | Status: DC

## 2021-05-07 NOTE — Progress Notes (Signed)
She presents today for 2 reasons 1 for her pathology report regarding her toenails the other for surgical consult regarding her right foot.  She states that there is been no changes in her past medical history medications allergies surgeries and social history.  Objective: Vital signs are stable she is alert and oriented x3 pulses are palpable.  Neurologic disorder is intact Deetjen reflexes are intact and strength is normal symmetrical.  Hallux valgus is prominent bilateral right worse than the left tailor's bunion deformity right.  Pathology result does demonstrate nailbed keratinization with possible saprophytic fungus.  Radiographs: Radiographs were reviewed today that were taken last visit.  Right foot does demonstrate hallux valgus form with increase in the first intermetatarsal angle greater than normal value and hallux abductus angle greater than normal value with minimal osteoarthritic change of the first metatarsophalangeal joint tailor's bunion deformity with lateral deviation of the fifth metatarsal is prominent.  Medial deviation of the fifth toe.  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  Onychomycosis saprophytic fungus and thickening of the nail bed.  Plan: Discussed etiology pathology and surgical therapies I have looked at her previous labs which did demonstrate normal BUN and creatinine and liver function..  I am going start her on Sporanox 100 mg 2 tablets daily and I will follow-up with her in 30 days for more blood work.  We discussed the pros and cons of the use of this medication as well as the possible side effects that are associated with it.  She is to notify us with any questions or concerns.  We consented her today for an [facility] bunion repair as well as 1/5 metatarsal osteotomy.  We discussed possible complications side effects associated with this she understands and is amenable to it we discussed possible  complications which may include but not limited to postop pain bleeding swelling infection recurrence need for further surgery overcorrection under correction also digit loss of limb loss of life.  Provide her with information regarding the surgery center and anesthesia.  Provided her with a cam walker she may wear preoperatively as well as postoperatively and I will follow-up with her in 1 month for blood work and then in January for surgery.

## 2021-05-13 ENCOUNTER — Encounter: Payer: Self-pay | Admitting: Gastroenterology

## 2021-05-19 ENCOUNTER — Encounter: Payer: Self-pay | Admitting: Gastroenterology

## 2021-05-19 ENCOUNTER — Other Ambulatory Visit: Payer: Self-pay

## 2021-05-19 ENCOUNTER — Ambulatory Visit (AMBULATORY_SURGERY_CENTER): Payer: BC Managed Care – PPO

## 2021-05-19 VITALS — Ht 67.5 in | Wt 282.0 lb

## 2021-05-19 DIAGNOSIS — Z1211 Encounter for screening for malignant neoplasm of colon: Secondary | ICD-10-CM

## 2021-05-19 NOTE — Progress Notes (Signed)

## 2021-05-27 DIAGNOSIS — F4323 Adjustment disorder with mixed anxiety and depressed mood: Secondary | ICD-10-CM | POA: Diagnosis not present

## 2021-06-01 ENCOUNTER — Encounter (HOSPITAL_BASED_OUTPATIENT_CLINIC_OR_DEPARTMENT_OTHER): Payer: BC Managed Care – PPO

## 2021-06-02 ENCOUNTER — Other Ambulatory Visit: Payer: Self-pay

## 2021-06-02 ENCOUNTER — Encounter: Payer: Self-pay | Admitting: Gastroenterology

## 2021-06-02 ENCOUNTER — Ambulatory Visit (AMBULATORY_SURGERY_CENTER): Payer: BC Managed Care – PPO | Admitting: Gastroenterology

## 2021-06-02 VITALS — BP 145/73 | HR 61 | Temp 96.6°F | Resp 16 | Ht 67.0 in | Wt 282.0 lb

## 2021-06-02 DIAGNOSIS — Z1211 Encounter for screening for malignant neoplasm of colon: Secondary | ICD-10-CM

## 2021-06-02 DIAGNOSIS — D122 Benign neoplasm of ascending colon: Secondary | ICD-10-CM

## 2021-06-02 MED ORDER — SODIUM CHLORIDE 0.9 % IV SOLN: 500.0000 mL | Freq: Once | INTRAVENOUS | Status: DC

## 2021-06-02 NOTE — Patient Instructions (Signed)
1 polyps removed- await pathology (next colonoscopy will be based on pathology)  Please read over handouts about polyps, diverticulosis, hemorrhoids and high fiber diets  Continue your normal medications, use FiberCon 1-2 tablets daily   YOU HAD AN ENDOSCOPIC PROCEDURE TODAY AT [facility]:   Refer to the procedure report that was given to you for any specific questions about what was found during the examination.  If the procedure report does not answer your questions, please call your gastroenterologist to clarify.  If you requested that your care partner not be given the details of your procedure findings, then the procedure report has been included in a sealed envelope for you to review at your convenience later.  YOU SHOULD EXPECT: Some feelings of bloating in the abdomen. Passage of more gas than usual.  Walking can help get rid of the air that was put into your GI tract during the procedure and reduce the bloating. If you had a lower endoscopy (such as a colonoscopy or flexible sigmoidoscopy) you may notice spotting of blood in your stool or on the toilet paper. If you underwent a bowel prep for your procedure, you may not have a normal bowel movement for a few days.  Please Note:  You might notice some irritation and congestion in your nose or some drainage.  This is from the oxygen used during your procedure.  There is no need for concern and it should clear up in a day or so.  SYMPTOMS TO REPORT IMMEDIATELY:  Following lower endoscopy (colonoscopy or flexible sigmoidoscopy):  Excessive amounts of blood in the stool  Significant tenderness or worsening of abdominal pains  Swelling of the abdomen that is new, acute  Fever of 100F or higher  Following upper endoscopy (EGD)  Vomiting of blood or coffee ground material  New chest pain or pain under the shoulder blades  Painful or persistently difficult swallowing  New shortness of breath  Fever of 100F or  higher  Black, tarry-looking stools  For urgent or emergent issues, a gastroenterologist can be reached at any hour by calling [phone]. Do not use MyChart messaging for urgent concerns.    DIET:  We do recommend a small meal at first, but then you may proceed to your regular diet.  Drink plenty of fluids but you should avoid alcoholic beverages for 24 hours.  ACTIVITY:  You should plan to take it easy for the rest of today and you should NOT DRIVE or use heavy machinery until tomorrow (because of the sedation medicines used during the test).    FOLLOW UP: Our staff will call the number listed on your records 48-72 hours following your procedure to check on you and address any questions or concerns that you may have regarding the information given to you following your procedure. If we do not reach you, we will leave a message.  We will attempt to reach you two times.  During this call, we will ask if you have developed any symptoms of COVID 19. If you develop any symptoms (ie: fever, flu-like symptoms, shortness of breath, cough etc.) before then, please call [phone].  If you test positive for Covid 19 in the 2 weeks post procedure, please call and report this information to [place].    If any biopsies were taken you will be contacted by phone or by letter within the next 1-3 weeks.  Please call us at [phone] if you have not heard about the biopsies in 3  weeks.    SIGNATURES/CONFIDENTIALITY: You and/or your care partner have signed paperwork which will be entered into your electronic medical record.  These signatures attest to the fact that that the information above on your After Visit Summary has been reviewed and is understood.  Full responsibility of the confidentiality of this discharge information lies with you and/or your care-partner.

## 2021-06-02 NOTE — Op Note (Signed)
[facility] Patient Name: Kim Fox Procedure Date: 06/02/2021 10:13 AM MRN: [id number] Endoscopist: Justice Britain , MD Age: 55 Referring MD:  Date of Birth: January 25, 1966 Gender: Female Account #: [id number] Procedure:                Colonoscopy Indications:              Screening for colorectal malignant neoplasm Medicines:                Monitored Anesthesia Care Procedure:                Pre-Anesthesia Assessment:                           - Prior to the procedure, a History and Physical                            was performed, and patient medications and                            allergies were reviewed. The patient's tolerance of                            previous anesthesia was also reviewed. The risks                            and benefits of the procedure and the sedation                            options and risks were discussed with the patient.                            All questions were answered, and informed consent                            was obtained. Prior Anticoagulants: The patient has                            taken no previous anticoagulant or antiplatelet                            agents except for aspirin. ASA Grade Assessment:                            III - A patient with severe systemic disease. After                            reviewing the risks and benefits, the patient was                            deemed in satisfactory condition to undergo the                            procedure.  After obtaining informed consent, the colonoscope                            was passed under direct vision. Throughout the                            procedure, the patient's blood pressure, pulse, and                            oxygen saturations were monitored continuously. The                            CF [id number] #[id number] was introduced through the anus                            and advanced to the the cecum,  identified by                            appendiceal orifice and ileocecal valve. The                            colonoscopy was performed without difficulty. The                            patient tolerated the procedure. The quality of the                            bowel preparation was good. The terminal ileum,                            ileocecal valve, appendiceal orifice, and rectum                            were photographed. Scope In: 10:26:46 AM Scope Out: 10:40:27 AM Scope Withdrawal Time: 0 hours 8 minutes 38 seconds  Total Procedure Duration: 0 hours 13 minutes 41 seconds  Findings:                 The digital rectal exam findings include                            hemorrhoids. Pertinent negatives include no                            palpable rectal lesions.                           The terminal ileum and ileocecal valve appeared                            normal.                           A 4 mm polyp was found in the ascending colon. The  polyp was sessile. The polyp was removed with a                            cold snare. Resection and retrieval were complete.                           Multiple small-mouthed diverticula were found in                            the recto-sigmoid colon and sigmoid colon.                           Normal mucosa was found in the entire colon                            otherwise.                           Non-bleeding non-thrombosed external and internal                            hemorrhoids were found during retroflexion, during                            perianal exam and during digital exam. The                            hemorrhoids were Grade II (internal hemorrhoids                            that prolapse but reduce spontaneously). Complications:            No immediate complications. Estimated Blood Loss:     Estimated blood loss was minimal. Impression:               - Hemorrhoids found on digital  rectal exam.                           - The examined portion of the ileum was normal.                           - One 4 mm polyp in the ascending colon, removed                            with a cold snare. Resected and retrieved.                           - Diverticulosis in the recto-sigmoid colon and in                            the sigmoid colon.                           - Normal mucosa in the entire examined colon  otherwise.                           - Non-bleeding non-thrombosed external and internal                            hemorrhoids. Recommendation:           - The patient will be observed post-procedure,                            until all discharge criteria are met.                           - Discharge patient to home.                           - Patient has a contact number available for                            emergencies. The signs and symptoms of potential                            delayed complications were discussed with the                            patient. Return to normal activities tomorrow.                            Written discharge instructions were provided to the                            patient.                           - High fiber diet.                           - Use FiberCon 1-2 tablets PO daily.                           - Continue present medications.                           - Await pathology results.                           - Repeat colonoscopy in 10/25/08 years for                            surveillance based on pathology results and                            findings of adenomatous tissue.                           - The findings and recommendations were discussed  with the patient.                           - The findings and recommendations were discussed                            with the patient's family. Justice Britain, MD 06/02/2021 10:46:13 AM

## 2021-06-02 NOTE — Progress Notes (Signed)
Pt's states no medical or surgical changes since previsit or office visit. VS by [place] 

## 2021-06-02 NOTE — Progress Notes (Signed)
Called to room to assist during endoscopic procedure.  Patient ID and intended procedure confirmed with present staff. Received instructions for my participation in the procedure from the performing physician.  

## 2021-06-02 NOTE — Progress Notes (Signed)
Report to PACU, RN, vss, BBS= Clear.  

## 2021-06-02 NOTE — Progress Notes (Signed)
GASTROENTEROLOGY PROCEDURE H&P NOTE   Primary Care Physician: [name], MD  HPI: Kim Fox is a 55 y.o. female who presents for Colonoscopy for screening.  Past Medical History:  Diagnosis Date   Depression    on meds   Dyslipidemia 10/17/2017   GERD (gastroesophageal reflux disease)    Headache(784.0)    menstral migraine   History of chicken pox 04/10/2015   Hypertension    on meds   Menorrhagia    Multiple renal cysts 08/09/2011   Murmur, cardiac    dx as a child-Echo WNL 2009   Obesity    Polycystic kidney disease    Preventative health care 04/20/2015   Right leg DVT ([place]) 2015   on meds for preventative tx 05/19/2021   Situational anxiety 10/17/2017   on meds   Urinary incontinence 10/28/2015   Past Surgical History:  Procedure Laterality Date   ENDOMETRIAL ABLATION  2016   ENDOMETRIAL BIOPSY  2010   KIDNEY CYST REMOVAL  2002   TONSILLECTOMY  1976   URETERAL STENT PLACEMENT  2002   WISDOM TOOTH EXTRACTION  1984   Current Outpatient Medications  Medication Sig Dispense Refill   aspirin EC 81 MG tablet Take 81 mg by mouth 2 (two) times daily.     atenolol (TENORMIN) 50 MG tablet Take 1 tablet (50 mg total) by mouth daily. 90 tablet 1   Biotin 5000 MCG CAPS Take 1 capsule by mouth daily.     Calcium Carbonate-Vitamin D (CALCIUM-VITAMIN D) 600-200 MG-UNIT CAPS Take 1 tablet by mouth 2 (two) times daily.     famotidine (PEPCID) 40 MG tablet TAKE 1/2 TO 1 TABLET BY MOUTH EVERY NIGHT AT BEDTIME AS NEEDED FOR HEARTBURN OR INDIGESTION 90 tablet 2   FLUoxetine (PROZAC) 20 MG tablet TAKE 1 TABLET(20 MG) BY MOUTH DAILY (Patient taking differently: Take 10 mg by mouth daily. TAKE 1/2 TABLET(20 MG) BY MOUTH DAILY) 90 tablet 1   folic acid (FOLVITE) 258 MCG tablet Take 400 mcg by mouth daily.     itraconazole (SPORANOX) 100 MG capsule Take 1 capsule (100 mg total) by mouth daily. (Patient not taking: Reported on 05/19/2021) 30 capsule 0   L-Lysine 1000 MG  TABS Take 1 tablet by mouth daily.     Lidocaine HCl (ASPERCREME LIDOCAINE) 4 % CREA Apply 1 Dose topically 2 (two) times daily as needed. 133 g 11   Multiple Vitamin (MULTIVITAMIN) tablet Take 1 tablet by mouth daily.     Omega 3-6-9 Fatty Acids (OMEGA 3-6-9 COMPLEX PO) Take 2 capsules by mouth daily.     Probiotic Product (ACIDOPHILUS/GOAT MILK PO) Take 1 capsule by mouth daily at 6 (six) AM.     vitamin B-12 (CYANOCOBALAMIN) 500 MCG tablet Take 500 mcg by mouth daily.     VITAMIN D PO Take 1 capsule by mouth daily at 6 (six) AM.     No current facility-administered medications for this visit.    Current Outpatient Medications:    aspirin EC 81 MG tablet, Take 81 mg by mouth 2 (two) times daily., Disp: , Rfl:    atenolol (TENORMIN) 50 MG tablet, Take 1 tablet (50 mg total) by mouth daily., Disp: 90 tablet, Rfl: 1   Biotin 5000 MCG CAPS, Take 1 capsule by mouth daily., Disp: , Rfl:    Calcium Carbonate-Vitamin D (CALCIUM-VITAMIN D) 600-200 MG-UNIT CAPS, Take 1 tablet by mouth 2 (two) times daily., Disp: , Rfl:    famotidine (PEPCID) 40 MG  tablet, TAKE 1/2 TO 1 TABLET BY MOUTH EVERY NIGHT AT BEDTIME AS NEEDED FOR HEARTBURN OR INDIGESTION, Disp: 90 tablet, Rfl: 2   FLUoxetine (PROZAC) 20 MG tablet, TAKE 1 TABLET(20 MG) BY MOUTH DAILY (Patient taking differently: Take 10 mg by mouth daily. TAKE 1/2 TABLET(20 MG) BY MOUTH DAILY), Disp: 90 tablet, Rfl: 1   folic acid (FOLVITE) 401 MCG tablet, Take 400 mcg by mouth daily., Disp: , Rfl:    itraconazole (SPORANOX) 100 MG capsule, Take 1 capsule (100 mg total) by mouth daily. (Patient not taking: Reported on 05/19/2021), Disp: 30 capsule, Rfl: 0   L-Lysine 1000 MG TABS, Take 1 tablet by mouth daily., Disp: , Rfl:    Lidocaine HCl (ASPERCREME LIDOCAINE) 4 % CREA, Apply 1 Dose topically 2 (two) times daily as needed., Disp: 133 g, Rfl: 11   Multiple Vitamin (MULTIVITAMIN) tablet, Take 1 tablet by mouth daily., Disp: , Rfl:    Omega 3-6-9 Fatty Acids  (OMEGA 3-6-9 COMPLEX PO), Take 2 capsules by mouth daily., Disp: , Rfl:    Probiotic Product (ACIDOPHILUS/GOAT MILK PO), Take 1 capsule by mouth daily at 6 (six) AM., Disp: , Rfl:    vitamin B-12 (CYANOCOBALAMIN) 500 MCG tablet, Take 500 mcg by mouth daily., Disp: , Rfl:    VITAMIN D PO, Take 1 capsule by mouth daily at 6 (six) AM., Disp: , Rfl:  Allergies  Allergen Reactions   Nitrofurantoin Rash   Macrodantin Rash   Penicillins Rash   Family History  Problem Relation Age of Onset   Diabetes Mother        insulin dependent   Hypertension Mother    Migraines Mother    Varicose Veins Mother    Arthritis Mother    Diabetes Father    Hypertension Father    Kidney disease Father    Arthritis Father    Migraines Sister    Allergies Sister    Renal Cyst Sister    Cancer Sister        skin cancer   Dysfunctional uterine bleeding Sister    Fibromyalgia Sister    Renal Cyst Sister    Hypertension Sister    Dysfunctional uterine bleeding Sister    Cancer Paternal Grandmother        lung   Cancer Paternal Grandfather        bladder, prostate   Heart disease Paternal Grandfather    Hyperlipidemia Daughter    Colon cancer Neg Hx    Esophageal cancer Neg Hx    Stomach cancer Neg Hx    Rectal cancer Neg Hx    Colon polyps Neg Hx    Social History   Socioeconomic History   Marital status: Divorced    Spouse name: Not on file   Number of children: 2   Years of education: college   Highest education level: Not on file  Occupational History   Occupation: Aeronautical engineer: [employer]  Tobacco Use   Smoking status: Never   Smokeless tobacco: Never  Vaping Use   Vaping Use: Never used  Substance and Sexual Activity   Alcohol use: Not Currently    Alcohol/week: 0.0 - 1.0 standard drinks    Comment: occ   Drug use: No   Sexual activity: Not on file  Other Topics Concern   Not on file  Social History Narrative   Not on file   Social Determinants  of Health   Financial Resource Strain: Not on  file  Food Insecurity: Not on file  Transportation Needs: Not on file  Physical Activity: Not on file  Stress: Not on file  Social Connections: Not on file  Intimate Partner Violence: Not on file    Physical Exam: There were no vitals filed for this visit. There is no height or weight on file to calculate BMI. GEN: NAD EYE: Sclerae anicteric ENT: MMM CV: Non-tachycardic GI: Soft, NT/ND NEURO:  Alert & Oriented x 3  Lab Results: No results for input(s): WBC, HGB, HCT, PLT in the last 72 hours. BMET No results for input(s): NA, K, CL, CO2, GLUCOSE, BUN, CREATININE, CALCIUM in the last 72 hours. LFT No results for input(s): PROT, ALBUMIN, AST, ALT, ALKPHOS, BILITOT, BILIDIR, IBILI in the last 72 hours. PT/INR No results for input(s): LABPROT, INR in the last 72 hours.   Impression / Plan: This is a 55 y.o.female who presents for Colonoscopy for screening.  The risks and benefits of endoscopic evaluation/treatment were discussed with the patient and/or family; these include but are not limited to the risk of perforation, infection, bleeding, missed lesions, lack of diagnosis, severe illness requiring hospitalization, as well as anesthesia and sedation related illnesses.  The patient's history has been reviewed, patient examined, no change in status, and deemed stable for procedure.  The patient and/or family is agreeable to proceed.    Justice Britain, MD [facility] Office # [phone]

## 2021-06-04 ENCOUNTER — Telehealth: Payer: Self-pay

## 2021-06-04 NOTE — Telephone Encounter (Signed)
°  Follow up Call-  Call back number 06/02/2021  Post procedure Call Back phone  # [phone]  Permission to leave phone message Yes  Some recent data might be hidden     Patient questions:  Do you have a fever, pain , or abdominal swelling? No. Pain Score  0 *  Have you tolerated food without any problems? Yes  Have you been able to return to your normal activities? Yes.    Do you have any questions about your discharge instructions: Diet   No. Medications  No. Follow up visit  No.  Do you have questions or concerns about your Care? No.  Actions: * If pain score is 4 or above: No action needed, pain <4.  Have you developed a fever since your procedure? no  2.   Have you had an respiratory symptoms (SOB or cough) since your procedure? no  3.   Have you tested positive for COVID 19 since your procedure no  4.   Have you had any family members/close contacts diagnosed with the COVID 19 since your procedure?  no   If yes to any of these questions please route to Joylene John, RN and Joella Prince, RN

## 2021-06-07 ENCOUNTER — Encounter: Payer: Self-pay | Admitting: Gastroenterology

## 2021-06-08 ENCOUNTER — Ambulatory Visit (HOSPITAL_BASED_OUTPATIENT_CLINIC_OR_DEPARTMENT_OTHER)
Admission: RE | Admit: 2021-06-08 | Discharge: 2021-06-08 | Disposition: A | Discharge status: Home / Self Care | Payer: BC Managed Care – PPO | Source: Ambulatory Visit | Attending: Family Medicine | Admitting: Family Medicine

## 2021-06-08 ENCOUNTER — Encounter (HOSPITAL_BASED_OUTPATIENT_CLINIC_OR_DEPARTMENT_OTHER): Payer: Self-pay

## 2021-06-08 ENCOUNTER — Other Ambulatory Visit: Payer: Self-pay

## 2021-06-08 DIAGNOSIS — Z1231 Encounter for screening mammogram for malignant neoplasm of breast: Secondary | ICD-10-CM

## 2021-06-11 ENCOUNTER — Other Ambulatory Visit: Payer: Self-pay

## 2021-06-11 ENCOUNTER — Ambulatory Visit: Payer: BC Managed Care – PPO | Admitting: Podiatry

## 2021-06-11 ENCOUNTER — Encounter: Payer: Self-pay | Admitting: Podiatry

## 2021-06-11 DIAGNOSIS — L603 Nail dystrophy: Secondary | ICD-10-CM

## 2021-06-11 DIAGNOSIS — Z79899 Other long term (current) drug therapy: Secondary | ICD-10-CM

## 2021-06-11 NOTE — Progress Notes (Signed)
She presents today for follow-up of her itraconazole therapy.  Her insurance company would not provide her with more than a pulsed dose worth of medication at a time.  She has another pulse dose waiting on her.  Objective: No change in physical exam onychomycosis.  Assessment long-term therapy onychomycosis.  Plan: Discussed etiology pathology and surgical therapy she is going to finish up her next pulse dose that she has waiting for her at the pharmacy we are also requesting blood work at this time.  This will consist of a CMP.  I would like to follow-up with her in 1 month in which time we hope to be able to provide her with a daily 200 mg dose so that we can keep a consistent concentration.  This was called on today and I believe at [facility] it is $134 with good Rx.  She will feel free to call with questions or concerns regarding the medication.

## 2021-07-01 ENCOUNTER — Telehealth: Payer: Self-pay | Admitting: Urology

## 2021-07-01 NOTE — Telephone Encounter (Signed)
DOS - 07/31/21  [place] BUNIONECTOMY RIGHT --- 11941 METATARSAL OSTEOTOMY 5TH RIGHT --- 74081  [employer] EFFECTIVE DATE - 06/21/2018  PLAN DEDUCTIBLE - $400.00 W/ $400.00 REMAINING OUT OF POCKET - $2,500.00 W/ $2,500.00 REMAINING COINSURANCE - 0% COPAY - $0.00   SPOKE WITH JOYCE WITH [employer] AND SHE STATED THAT FOR CPT CODES 44818 AND 56314 NO PRIOR AUTH REQUIRED.  REF # I - [id number]

## 2021-07-07 ENCOUNTER — Encounter: Payer: Self-pay | Admitting: Podiatry

## 2021-07-09 ENCOUNTER — Ambulatory Visit: Payer: BC Managed Care – PPO | Admitting: Podiatry

## 2021-07-16 DIAGNOSIS — F4323 Adjustment disorder with mixed anxiety and depressed mood: Secondary | ICD-10-CM | POA: Diagnosis not present

## 2021-07-21 ENCOUNTER — Encounter: Payer: Self-pay | Admitting: Podiatry

## 2021-07-27 ENCOUNTER — Ambulatory Visit: Payer: BC Managed Care – PPO | Admitting: Family Medicine

## 2021-07-27 ENCOUNTER — Encounter: Payer: Self-pay | Admitting: Family Medicine

## 2021-07-27 VITALS — BP 130/74 | HR 60 | Temp 97.6°F | Resp 16 | Wt 286.6 lb

## 2021-07-27 DIAGNOSIS — M79671 Pain in right foot: Secondary | ICD-10-CM

## 2021-07-27 DIAGNOSIS — I10 Essential (primary) hypertension: Secondary | ICD-10-CM

## 2021-07-27 DIAGNOSIS — D751 Secondary polycythemia: Secondary | ICD-10-CM | POA: Diagnosis not present

## 2021-07-27 DIAGNOSIS — E559 Vitamin D deficiency, unspecified: Secondary | ICD-10-CM

## 2021-07-27 DIAGNOSIS — R739 Hyperglycemia, unspecified: Secondary | ICD-10-CM

## 2021-07-27 DIAGNOSIS — M79672 Pain in left foot: Secondary | ICD-10-CM

## 2021-07-27 DIAGNOSIS — E6609 Other obesity due to excess calories: Secondary | ICD-10-CM

## 2021-07-27 DIAGNOSIS — M25562 Pain in left knee: Secondary | ICD-10-CM

## 2021-07-27 DIAGNOSIS — F418 Other specified anxiety disorders: Secondary | ICD-10-CM

## 2021-07-27 DIAGNOSIS — E785 Hyperlipidemia, unspecified: Secondary | ICD-10-CM

## 2021-07-27 DIAGNOSIS — G8929 Other chronic pain: Secondary | ICD-10-CM

## 2021-07-27 LAB — LIPID PANEL
Cholesterol: 199 mg/dL (ref 0–200)
HDL: 52.6 mg/dL (ref >39.00)
LDL Cholesterol: 120 mg/dL — ABNORMAL HIGH (ref 0–99)
NonHDL: 146.15
Total CHOL/HDL Ratio: 4
Triglycerides: 133 mg/dL (ref 0.0–149.0)
VLDL: 26.6 mg/dL (ref 0.0–40.0)

## 2021-07-27 LAB — COMPREHENSIVE METABOLIC PANEL
ALT: 15 U/L (ref 0–35)
AST: 20 U/L (ref 0–37)
Albumin: 3.9 g/dL (ref 3.5–5.2)
Alkaline Phosphatase: 74 U/L (ref 39–117)
BUN: 25 mg/dL — ABNORMAL HIGH (ref 6–23)
CO2: 31 mEq/L (ref 19–32)
Calcium: 9.5 mg/dL (ref 8.4–10.5)
Chloride: 103 mEq/L (ref 96–112)
Creatinine, Ser: 0.91 mg/dL (ref 0.40–1.20)
GFR: 71.68 mL/min (ref >60.00)
Glucose, Bld: 101 mg/dL — ABNORMAL HIGH (ref 70–99)
Potassium: 4.2 mEq/L (ref 3.5–5.1)
Sodium: 140 mEq/L (ref 135–145)
Total Bilirubin: 0.7 mg/dL (ref 0.2–1.2)
Total Protein: 7.3 g/dL (ref 6.0–8.3)

## 2021-07-27 LAB — CBC
HCT: 45.6 % (ref 36.0–46.0)
Hemoglobin: 14.8 g/dL (ref 12.0–15.0)
MCHC: 32.4 g/dL (ref 30.0–36.0)
MCV: 86.7 fl (ref 78.0–100.0)
Platelets: 220 10*3/uL (ref 150.0–400.0)
RBC: 5.26 Mil/uL — ABNORMAL HIGH (ref 3.87–5.11)
RDW: 14.8 % (ref 11.5–15.5)
WBC: 7.3 10*3/uL (ref 4.0–10.5)

## 2021-07-27 LAB — HEMOGLOBIN A1C: Hgb A1c MFr Bld: 6.1 % (ref 4.6–6.5)

## 2021-07-27 LAB — VITAMIN D 25 HYDROXY (VIT D DEFICIENCY, FRACTURES): VITD: 41.82 ng/mL (ref 30.00–100.00)

## 2021-07-27 LAB — TSH: TSH: 1.34 u[IU]/mL (ref 0.35–5.50)

## 2021-07-27 MED ORDER — WEGOVY 0.25 MG/0.5ML ~~LOC~~ SOAJ: 0.2500 mg | SUBCUTANEOUS | 0 refills | Status: DC

## 2021-07-27 NOTE — Assessment & Plan Note (Signed)
Encouraged DASH or MIND diet, decrease po intake and increase exercise as tolerated. Needs 7-8 hours of sleep nightly. Avoid trans fats, eat small, frequent meals every 4-5 hours with lean proteins, complex carbs and healthy fats. Minimize simple carbs, high fat foods and processed foods. Started on Wegovy 0.25 mg weekly for a month and then titrate up as tolerated

## 2021-07-27 NOTE — Progress Notes (Signed)
Subjective:   By signing my name below, I, Zite Okoli, attest that this documentation has been prepared under the direction and in the presence of [name], MD. 07/27/2021       Patient ID: Kim Fox, female    DOB: July 17, 1965, 56 y.o.   MRN: [id number]  Chief Complaint  Patient presents with   4 months follow up    HPI Patient is in today for an office visit and 4 month f/u  She will be having surgery on her right foot on Friday for a metatarsal osteotomy. She will reschedule the left foot surgery for later. She reports there has been pain in her left knee and describes it as achy not excruciating.   She is handling things better now but is still taking 20 mg prozac to manage her mood when needed.   She would like to start wegovy to manage weight loss.  She has received the flu vaccine and will receive the shingles vaccine later this week.  Past Medical History:  Diagnosis Date   Depression    on meds   Dyslipidemia 10/17/2017   GERD (gastroesophageal reflux disease)    Headache(784.0)    menstral migraine   History of chicken pox 04/10/2015   Hypertension    on meds   Menorrhagia    Multiple renal cysts 08/09/2011   Murmur, cardiac    dx as a child-Echo WNL 2009   Obesity    Polycystic kidney disease    Preventative health care 04/20/2015   Right leg DVT ([place]) 2015   on meds for preventative tx 05/19/2021   Situational anxiety 10/17/2017   on meds   Urinary incontinence 10/28/2015    Past Surgical History:  Procedure Laterality Date   ENDOMETRIAL ABLATION  2016   ENDOMETRIAL BIOPSY  2010   KIDNEY CYST REMOVAL  2002   TONSILLECTOMY  1976   URETERAL STENT PLACEMENT  2002   WISDOM TOOTH EXTRACTION  1984    Family History  Problem Relation Age of Onset   Diabetes Mother        insulin dependent   Hypertension Mother    Migraines Mother    Varicose Veins Mother    Arthritis Mother    Heart disease Father    Diabetes Father     Hypertension Father    Kidney disease Father    Arthritis Father    Migraines Sister    Allergies Sister    Renal Cyst Sister    Cancer Sister        skin cancer   Dysfunctional uterine bleeding Sister    Fibromyalgia Sister    Renal Cyst Sister    Hypertension Sister    Dysfunctional uterine bleeding Sister    Hyperlipidemia Daughter    Cancer Paternal Grandmother        lung   Cancer Paternal Grandfather        bladder, prostate   Heart disease Paternal Grandfather    Colon cancer Neg Hx    Esophageal cancer Neg Hx    Stomach cancer Neg Hx    Rectal cancer Neg Hx    Colon polyps Neg Hx     Social History   Socioeconomic History   Marital status: Divorced    Spouse name: Not on file   Number of children: 2   Years of education: college   Highest education level: Not on file  Occupational History   Occupation: Aeronautical engineer:  [employer]  Tobacco Use   Smoking status: Never   Smokeless tobacco: Never  Vaping Use   Vaping Use: Never used  Substance and Sexual Activity   Alcohol use: Not Currently    Alcohol/week: 0.0 - 1.0 standard drinks    Comment: occ   Drug use: No   Sexual activity: Not on file  Other Topics Concern   Not on file  Social History Narrative   Not on file   Social Determinants of Health   Financial Resource Strain: Not on file  Food Insecurity: Not on file  Transportation Needs: Not on file  Physical Activity: Not on file  Stress: Not on file  Social Connections: Not on file  Intimate Partner Violence: Not on file    Outpatient Medications Prior to Visit  Medication Sig Dispense Refill   aspirin EC 81 MG tablet Take 81 mg by mouth 2 (two) times daily.     atenolol (TENORMIN) 50 MG tablet Take 1 tablet (50 mg total) by mouth daily. 90 tablet 1   Biotin 5000 MCG CAPS Take 1 capsule by mouth daily.     Calcium Carbonate-Vitamin D (CALCIUM-VITAMIN D) 600-200 MG-UNIT CAPS Take 1 tablet by mouth 2 (two) times  daily.     famotidine (PEPCID) 40 MG tablet TAKE 1/2 TO 1 TABLET BY MOUTH EVERY NIGHT AT BEDTIME AS NEEDED FOR HEARTBURN OR INDIGESTION 90 tablet 2   FLUoxetine (PROZAC) 20 MG tablet TAKE 1 TABLET(20 MG) BY MOUTH DAILY (Patient taking differently: Take 10 mg by mouth daily. TAKE 1/2 TABLET(20 MG) BY MOUTH DAILY) 90 tablet 1   folic acid (FOLVITE) 101 MCG tablet Take 400 mcg by mouth daily.     itraconazole (SPORANOX) 100 MG capsule Take by mouth.     L-Lysine 1000 MG TABS Take 1 tablet by mouth daily.     Lidocaine HCl (ASPERCREME LIDOCAINE) 4 % CREA Apply 1 Dose topically 2 (two) times daily as needed. 133 g 11   Multiple Vitamin (MULTIVITAMIN) tablet Take 1 tablet by mouth daily.     Omega 3-6-9 Fatty Acids (OMEGA 3-6-9 COMPLEX PO) Take 2 capsules by mouth daily.     Probiotic Product (ACIDOPHILUS/GOAT MILK PO) Take 1 capsule by mouth daily at 6 (six) AM.     vitamin B-12 (CYANOCOBALAMIN) 500 MCG tablet Take 500 mcg by mouth daily.     VITAMIN D PO Take 1 capsule by mouth daily at 6 (six) AM.     No facility-administered medications prior to visit.    Allergies  Allergen Reactions   Nitrofurantoin Rash   Macrodantin Rash   Penicillins Rash    Review of Systems  Constitutional:  Negative for fever and malaise/fatigue.  HENT:  Negative for congestion.   Eyes:  Negative for redness.  Respiratory:  Negative for shortness of breath.   Cardiovascular:  Negative for chest pain, palpitations and leg swelling.  Gastrointestinal:  Negative for abdominal pain, blood in stool and nausea.  Genitourinary:  Negative for dysuria and frequency.  Musculoskeletal:  Positive for joint pain (left knee). Negative for falls.  Skin:  Negative for rash.  Neurological:  Negative for dizziness, loss of consciousness and headaches.  Endo/Heme/Allergies:  Negative for polydipsia.  Psychiatric/Behavioral:  Negative for depression. The patient is not nervous/anxious.       Objective:    Physical  Exam Constitutional:      General: She is not in acute distress.    Appearance: She is well-developed.  HENT:  Head: Normocephalic and atraumatic.  Eyes:     Conjunctiva/sclera: Conjunctivae normal.  Neck:     Thyroid: No thyromegaly.  Cardiovascular:     Rate and Rhythm: Normal rate and regular rhythm.     Heart sounds: Normal heart sounds. No murmur heard. Pulmonary:     Effort: Pulmonary effort is normal. No respiratory distress.     Breath sounds: Normal breath sounds.  Abdominal:     General: Bowel sounds are normal. There is no distension.     Palpations: Abdomen is soft. There is no mass.     Tenderness: There is no abdominal tenderness.  Musculoskeletal:     Cervical back: Neck supple.  Lymphadenopathy:     Cervical: No cervical adenopathy.  Skin:    General: Skin is warm and dry.  Neurological:     Mental Status: She is alert and oriented to person, place, and time.  Psychiatric:        Behavior: Behavior normal.    BP 130/74    Pulse 60    Temp 97.6 F (36.4 C)    Resp 16    Wt 286 lb 9.6 oz (130 kg)    SpO2 91%    BMI 44.89 kg/m  Wt Readings from Last 3 Encounters:  07/27/21 286 lb 9.6 oz (130 kg)  06/02/21 282 lb (127.9 kg)  05/19/21 282 lb (127.9 kg)    Diabetic Foot Exam - Simple   No data filed    Lab Results  Component Value Date   WBC 7.3 07/27/2021   HGB 14.8 07/27/2021   HCT 45.6 07/27/2021   PLT 220.0 07/27/2021   GLUCOSE 101 (H) 07/27/2021   CHOL 199 07/27/2021   TRIG 133.0 07/27/2021   HDL 52.60 07/27/2021   LDLDIRECT 117.0 07/27/2018   LDLCALC 120 (H) 07/27/2021   ALT 15 07/27/2021   AST 20 07/27/2021   NA 140 07/27/2021   K 4.2 07/27/2021   CL 103 07/27/2021   CREATININE 0.91 07/27/2021   BUN 25 (H) 07/27/2021   CO2 31 07/27/2021   TSH 1.34 07/27/2021   INR 1.03 03/25/2014   HGBA1C 6.1 07/27/2021   MICROALBUR 1.0 10/17/2017    Lab Results  Component Value Date   TSH 1.34 07/27/2021   Lab Results  Component Value  Date   WBC 7.3 07/27/2021   HGB 14.8 07/27/2021   HCT 45.6 07/27/2021   MCV 86.7 07/27/2021   PLT 220.0 07/27/2021   Lab Results  Component Value Date   NA 140 07/27/2021   K 4.2 07/27/2021   CHLORIDE 108 10/07/2016   CO2 31 07/27/2021   GLUCOSE 101 (H) 07/27/2021   BUN 25 (H) 07/27/2021   CREATININE 0.91 07/27/2021   BILITOT 0.7 07/27/2021   ALKPHOS 74 07/27/2021   AST 20 07/27/2021   ALT 15 07/27/2021   PROT 7.3 07/27/2021   ALBUMIN 3.9 07/27/2021   CALCIUM 9.5 07/27/2021   ANIONGAP 8 10/07/2016   EGFR 80 (L) 10/07/2016   GFR 71.68 07/27/2021   Lab Results  Component Value Date   CHOL 199 07/27/2021   Lab Results  Component Value Date   HDL 52.60 07/27/2021   Lab Results  Component Value Date   LDLCALC 120 (H) 07/27/2021   Lab Results  Component Value Date   TRIG 133.0 07/27/2021   Lab Results  Component Value Date   CHOLHDL 4 07/27/2021   Lab Results  Component Value Date   HGBA1C 6.1 07/27/2021  Assessment & Plan:   Problem List Items Addressed This Visit     Essential hypertension, benign    Well controlled, no changes to meds. Encouraged heart healthy diet such as the DASH diet and exercise as tolerated.       Obesity    Encouraged DASH or MIND diet, decrease po intake and increase exercise as tolerated. Needs 7-8 hours of sleep nightly. Avoid trans fats, eat small, frequent meals every 4-5 hours with lean proteins, complex carbs and healthy fats. Minimize simple carbs, high fat foods and processed foods. Started on Wegovy 0.25 mg weekly for a month and then titrate up as tolerated      Relevant Medications   Semaglutide-Weight Management (WEGOVY) 0.25 MG/0.5ML SOAJ   Dyslipidemia - Primary    Encourage heart healthy diet such as MIND or DASH diet, increase exercise, avoid trans fats, simple carbohydrates and processed foods, consider a krill or fish or flaxseed oil cap daily.       Relevant Orders   Lipid panel (Completed)   TSH  (Completed)   Depression with anxiety    Stayed on Fluoxetine and is doing better so will not make any changes for now      Hyperglycemia    hgba1c acceptable, minimize simple carbs. Increase exercise as tolerated.       Relevant Orders   Comprehensive metabolic panel (Completed)   TSH (Completed)   Hemoglobin A1c (Completed)   Polycythemia   Relevant Orders   CBC (Completed)   Foot pain, bilateral    Following with Dr Milinda Pointer of Podiatry regarding her pain. They plan surgery on first and fifth metatarsal with pins on Friday. She works from home so they are only asking her to elevate and take 2 weeks off.. right foot is first and then will consider the left later but her left knee is also bothering her so she will decide that later. She does not need as much work on the left foot.       Vitamin D deficiency   Relevant Orders   VITAMIN D 25 Hydroxy (Vit-D Deficiency, Fractures) (Completed)   Left knee pain    Follows with [facility], Dr Stann Mainland and they are reevaluating may consider surgery in the future once her right foot is better. Ice and use Aspercreme while immobile       Meds ordered this encounter  Medications   Semaglutide-Weight Management (WEGOVY) 0.25 MG/0.5ML SOAJ    Sig: Inject 0.25 mg into the skin once a week.    Dispense:  2 mL    Refill:  0    I,Zite Okoli,acting as a scribe for Penni Homans, MD.,have documented all relevant documentation on the behalf of Penni Homans, MD,as directed by  Penni Homans, MD while in the presence of Penni Homans, MD.   I, [name], MD., personally preformed the services described in this documentation.  All medical record entries made by the scribe were at my direction and in my presence.  I have reviewed the chart and discharge instructions (if applicable) and agree that the record reflects my personal performance and is accurate and complete. 07/27/2021

## 2021-07-27 NOTE — Assessment & Plan Note (Signed)
Follows with [facility], Dr Stann Mainland and they are reevaluating may consider surgery in the future once her right foot is better. Ice and use Aspercreme while immobile

## 2021-07-27 NOTE — Assessment & Plan Note (Signed)
Well controlled, no changes to meds. Encouraged heart healthy diet such as the DASH diet and exercise as tolerated.  °

## 2021-07-27 NOTE — Assessment & Plan Note (Signed)
Stayed on Fluoxetine and is doing better so will not make any changes for now

## 2021-07-27 NOTE — Patient Instructions (Signed)
Foot Pain °Many things can cause foot pain. Some common causes are: °An injury. °A sprain. °Arthritis. °Blisters. °Bunions. °Follow these instructions at home: °Managing pain, stiffness, and swelling °If directed, put ice on the painful area: °Put ice in a plastic bag. °Place a towel between your skin and the bag. °Leave the ice on for 20 minutes, 2-3 times a day. ° °Activity °Do not stand or walk for long periods. °Return to your normal activities as told by your health care provider. Ask your health care provider what activities are safe for you. °Do stretches to relieve foot pain and stiffness as told by your health care provider. °Do not lift anything that is heavier than 10 lb (4.5 kg), or the limit that you are told, until your health care provider says that it is safe. Lifting a lot of weight can put added pressure on your feet. °Lifestyle °Wear comfortable, supportive shoes that fit you well. Do not wear high heels. °Keep your feet clean and dry. °General instructions °Take over-the-counter and prescription medicines only as told by your health care provider. °Rub your foot gently. °Pay attention to any changes in your symptoms. °Keep all follow-up visits as told by your health care provider. This is important. °Contact a health care provider if: °Your pain does not get better after a few days of self-care. °Your pain gets worse. °You cannot stand on your foot. °Get help right away if: °Your foot is numb or tingling. °Your foot or toes are swollen. °Your foot or toes turn white or blue. °You have warmth and redness along your foot. °Summary °Common causes of foot pain are injury, sprain, arthritis, blisters, or bunions. °Ice, medicines, and comfortable shoes may help foot pain. °Contact your health care provider if your pain does not get better after a few days of self-care. °This information is not intended to replace advice given to you by your health care provider. Make sure you discuss any questions you  have with your health care provider. °Document Revised: 09/10/2020 Document Reviewed: 09/10/2020 °Elsevier Patient Education © 2022 [employer] ° °

## 2021-07-27 NOTE — Assessment & Plan Note (Signed)
Encourage heart healthy diet such as MIND or DASH diet, increase exercise, avoid trans fats, simple carbohydrates and processed foods, consider a krill or fish or flaxseed oil cap daily.  °

## 2021-07-27 NOTE — Assessment & Plan Note (Signed)
hgba1c acceptable, minimize simple carbs. Increase exercise as tolerated.  

## 2021-07-27 NOTE — Assessment & Plan Note (Signed)
Following with Dr Milinda Pointer of Podiatry regarding her pain. They plan surgery on first and fifth metatarsal with pins on Friday. She works from home so they are only asking her to elevate and take 2 weeks off.. right foot is first and then will consider the left later but her left knee is also bothering her so she will decide that later. She does not need as much work on the left foot.

## 2021-07-28 ENCOUNTER — Telehealth: Payer: Self-pay

## 2021-07-28 NOTE — Telephone Encounter (Signed)
PA approved.   [id number];Review Type:Prior Auth;Coverage Start Date:06/28/2021;Coverage End Date:02/23/2022;

## 2021-07-28 NOTE — Telephone Encounter (Signed)
PA initiated via Covermymeds; KEY:  [id number]. Awaiting determination.

## 2021-07-30 ENCOUNTER — Other Ambulatory Visit: Payer: Self-pay | Admitting: Podiatry

## 2021-07-30 MED ORDER — ONDANSETRON HCL 4 MG PO TABS: 4.0000 mg | ORAL_TABLET | Freq: Three times a day (TID) | ORAL | 0 refills | Status: DC | PRN

## 2021-07-30 MED ORDER — CLINDAMYCIN HCL 150 MG PO CAPS: 150.0000 mg | ORAL_CAPSULE | Freq: Three times a day (TID) | ORAL | 0 refills | Status: DC

## 2021-07-30 MED ORDER — OXYCODONE-ACETAMINOPHEN 10-325 MG PO TABS: 1.0000 | ORAL_TABLET | Freq: Three times a day (TID) | ORAL | 0 refills | Status: AC | PRN

## 2021-07-31 DIAGNOSIS — M25571 Pain in right ankle and joints of right foot: Secondary | ICD-10-CM | POA: Diagnosis not present

## 2021-07-31 DIAGNOSIS — D481 Neoplasm of uncertain behavior of connective and other soft tissue: Secondary | ICD-10-CM | POA: Diagnosis not present

## 2021-07-31 DIAGNOSIS — M21611 Bunion of right foot: Secondary | ICD-10-CM | POA: Diagnosis not present

## 2021-07-31 DIAGNOSIS — M2011 Hallux valgus (acquired), right foot: Secondary | ICD-10-CM | POA: Diagnosis not present

## 2021-07-31 DIAGNOSIS — M21541 Acquired clubfoot, right foot: Secondary | ICD-10-CM | POA: Diagnosis not present

## 2021-08-06 ENCOUNTER — Ambulatory Visit (INDEPENDENT_AMBULATORY_CARE_PROVIDER_SITE_OTHER): Payer: BC Managed Care – PPO | Admitting: Podiatry

## 2021-08-06 ENCOUNTER — Other Ambulatory Visit: Payer: Self-pay

## 2021-08-06 ENCOUNTER — Encounter: Payer: Self-pay | Admitting: Podiatry

## 2021-08-06 ENCOUNTER — Ambulatory Visit (INDEPENDENT_AMBULATORY_CARE_PROVIDER_SITE_OTHER): Payer: BC Managed Care – PPO

## 2021-08-06 DIAGNOSIS — M2011 Hallux valgus (acquired), right foot: Secondary | ICD-10-CM

## 2021-08-06 DIAGNOSIS — M21621 Bunionette of right foot: Secondary | ICD-10-CM

## 2021-08-06 DIAGNOSIS — Z9889 Other specified postprocedural states: Secondary | ICD-10-CM

## 2021-08-07 ENCOUNTER — Encounter: Payer: Self-pay | Admitting: Podiatry

## 2021-08-07 NOTE — Telephone Encounter (Signed)
Please advise 

## 2021-08-08 NOTE — Progress Notes (Signed)
Kim Fox presents today for postop visit date of surgery 07/31/2021 [facility] bunionectomy fifth metatarsal osteotomy states that is been sore and a little throbbing particularly lateral in the top.  Objective: Vital signs are stable alert and oriented x3.  Pulses are palpable.  Moderate edema at after the dry sterile compressive dressing was removed there is no bleeding.  I incision sites appear to be healing very nicely she has good range of motion first metatarsal phalangeal joint.  Considerable ecchymosis.  Radiographs taken today demonstrate internal fixation in good position first metatarsophalangeal joint is in good position with internal fixation intact.  Assessment: Well-healing surgical foot.  Plan: Redressed today dressed compressive dressing continue use of the cam walker follow-up with her in 1 week for another dressing change and reevaluation of the stitches.  Also remember to ask how she is doing with her medication for her toenails.

## 2021-08-12 DIAGNOSIS — F4323 Adjustment disorder with mixed anxiety and depressed mood: Secondary | ICD-10-CM | POA: Diagnosis not present

## 2021-08-13 ENCOUNTER — Ambulatory Visit (INDEPENDENT_AMBULATORY_CARE_PROVIDER_SITE_OTHER): Payer: BC Managed Care – PPO | Admitting: Podiatry

## 2021-08-13 ENCOUNTER — Encounter: Payer: Self-pay | Admitting: Podiatry

## 2021-08-13 ENCOUNTER — Other Ambulatory Visit: Payer: Self-pay

## 2021-08-13 DIAGNOSIS — Z9889 Other specified postprocedural states: Secondary | ICD-10-CM

## 2021-08-13 DIAGNOSIS — M2011 Hallux valgus (acquired), right foot: Secondary | ICD-10-CM

## 2021-08-13 DIAGNOSIS — M21621 Bunionette of right foot: Secondary | ICD-10-CM

## 2021-08-15 NOTE — Progress Notes (Signed)
She presents today date of surgery 07/31/2021 status post right foot [place] bunionectomy and fifth metatarsal osteotomy states that this throbs a lot and is sore a lot.  Objective: Vital signs are stable alert oriented x3 the foot is moderately edematous and she has good range of motion of the first metatarsophalangeal joint though active range of motion is a little guarded.  Assessment: Well-healing surgical foot.  Plan: Redressed for another week follow-up with her at that time.

## 2021-08-18 ENCOUNTER — Encounter: Payer: Self-pay | Admitting: Podiatry

## 2021-08-27 ENCOUNTER — Ambulatory Visit (INDEPENDENT_AMBULATORY_CARE_PROVIDER_SITE_OTHER): Payer: BC Managed Care – PPO | Admitting: Podiatry

## 2021-08-27 ENCOUNTER — Ambulatory Visit (INDEPENDENT_AMBULATORY_CARE_PROVIDER_SITE_OTHER): Payer: BC Managed Care – PPO

## 2021-08-27 ENCOUNTER — Other Ambulatory Visit: Payer: Self-pay

## 2021-08-27 ENCOUNTER — Encounter: Payer: Self-pay | Admitting: Podiatry

## 2021-08-27 DIAGNOSIS — Z9889 Other specified postprocedural states: Secondary | ICD-10-CM

## 2021-08-27 DIAGNOSIS — M2011 Hallux valgus (acquired), right foot: Secondary | ICD-10-CM | POA: Diagnosis not present

## 2021-08-27 DIAGNOSIS — M21621 Bunionette of right foot: Secondary | ICD-10-CM

## 2021-08-27 NOTE — Progress Notes (Signed)
Kim Fox presents today for postop visit date of surgery is 07/31/2021 [facility] bunionectomy and 1/5 metatarsal osteotomy.  States that is still sore.  She still retains sutures. ? ?Objective: Vital signs are stable she is alert oriented x3 she is bruised considerably across the dorsum of the foot but still has good strong palpable pulses.  Her incision sites have gone on to heal uneventfully and I removed all of the sutures today margins remain well coapted.  Radiographically her first metatarsal and fifth metatarsal and the screws are in place and in good position with good compression. ? ?Assessment: Well-healing surgical foot. ? ?Plan: I am concerned about her holding the toe up when she walks and I had encouraged her to walk with it down.  She continues to use her Darco shoe on a regular basis.  We did discuss possibly physical therapy which she does not want to do. ?

## 2021-09-05 ENCOUNTER — Other Ambulatory Visit: Payer: Self-pay | Admitting: Family Medicine

## 2021-09-05 DIAGNOSIS — I10 Essential (primary) hypertension: Secondary | ICD-10-CM

## 2021-09-10 ENCOUNTER — Ambulatory Visit (INDEPENDENT_AMBULATORY_CARE_PROVIDER_SITE_OTHER): Payer: BC Managed Care – PPO

## 2021-09-10 ENCOUNTER — Encounter: Payer: Self-pay | Admitting: Family Medicine

## 2021-09-10 ENCOUNTER — Ambulatory Visit (INDEPENDENT_AMBULATORY_CARE_PROVIDER_SITE_OTHER): Payer: BC Managed Care – PPO | Admitting: Podiatry

## 2021-09-10 ENCOUNTER — Other Ambulatory Visit: Payer: Self-pay

## 2021-09-10 DIAGNOSIS — M21621 Bunionette of right foot: Secondary | ICD-10-CM

## 2021-09-10 DIAGNOSIS — M2011 Hallux valgus (acquired), right foot: Secondary | ICD-10-CM

## 2021-09-10 DIAGNOSIS — Z9889 Other specified postprocedural states: Secondary | ICD-10-CM

## 2021-09-10 NOTE — Progress Notes (Signed)
Kim Fox presents today for follow-up of her Kim Fox.  She states that is doing great to have no problems with it whatsoever. ? ?Objective: Vital signs are stable alert oriented x3 great range of motion of the first metatarsophalangeal joint of the right foot.  Radiographs taken today demonstrate an osseously mature individual with well-healing osteotomy.  Fifth metatarsal osteotomy with double screw fixation is healing very nicely. ? ?Assessment: Well-healing surgical foot. ? ?Plan: Encouraged range of motion exercises particular on plantarflexion she has good dorsiflexion.  Her fifth metatarsal osteotomy is healing well. ?

## 2021-09-17 DIAGNOSIS — F4323 Adjustment disorder with mixed anxiety and depressed mood: Secondary | ICD-10-CM | POA: Diagnosis not present

## 2021-09-21 ENCOUNTER — Encounter: Payer: Self-pay | Admitting: Podiatry

## 2021-09-21 IMAGING — DX DG KNEE COMPLETE 4+V*L*
4 series · 4 of 4 positions shown · non-contrast
Comparison: None.

CLINICAL DATA: Left knee pain and swelling after trauma.

EXAM:
LEFT KNEE - COMPLETE 4+ VIEW

[knee ap]
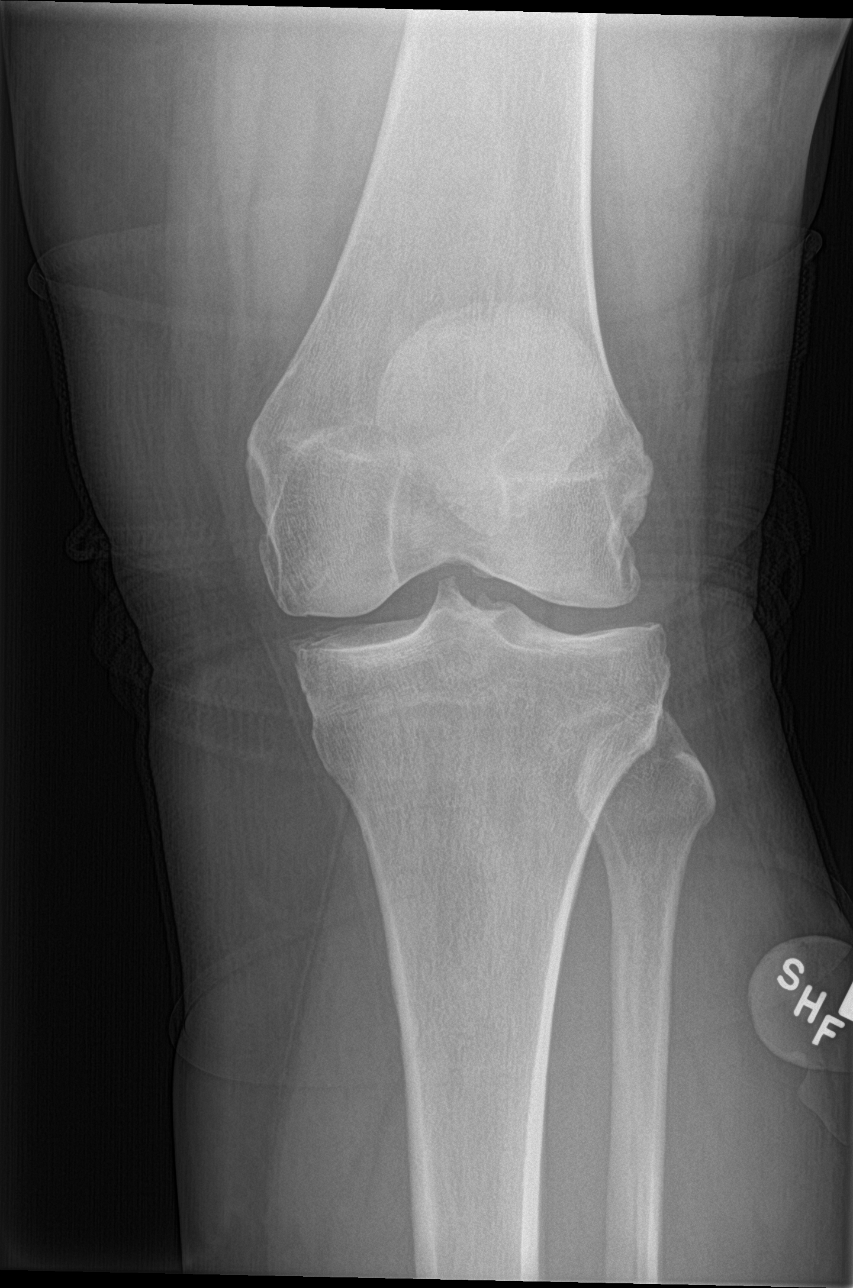

[knee lat]
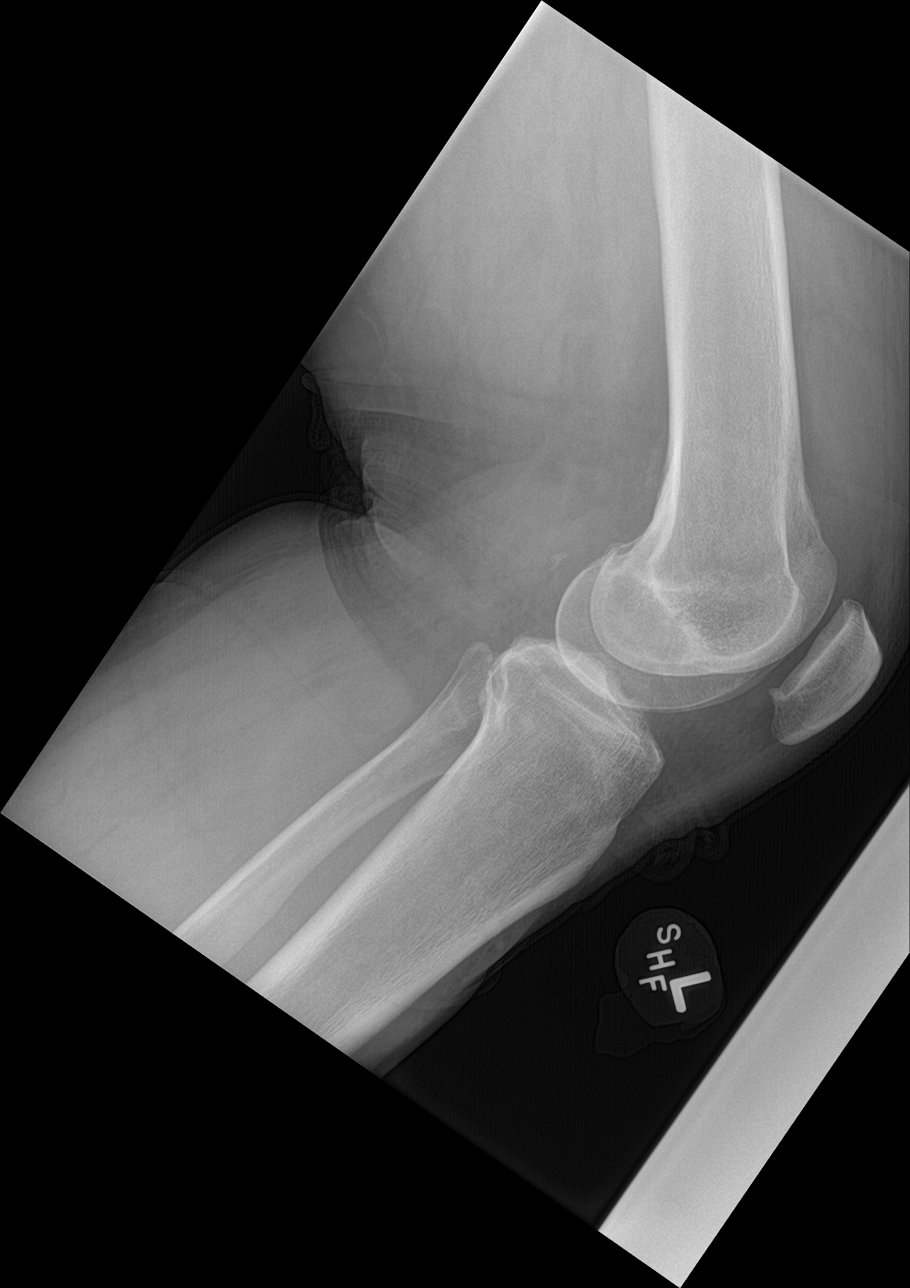

[knee obl (1 of 2)]
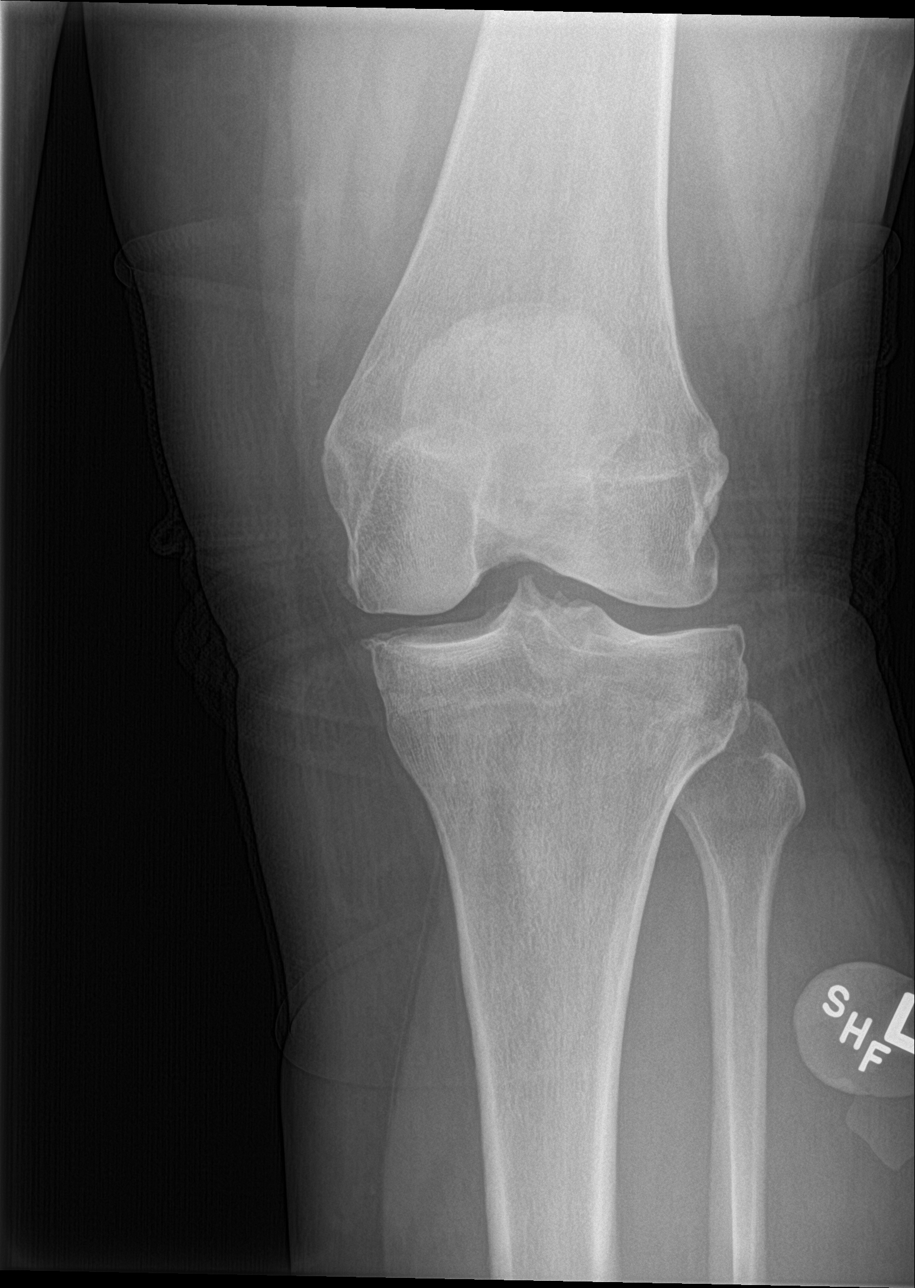

[knee obl (2 of 2)]
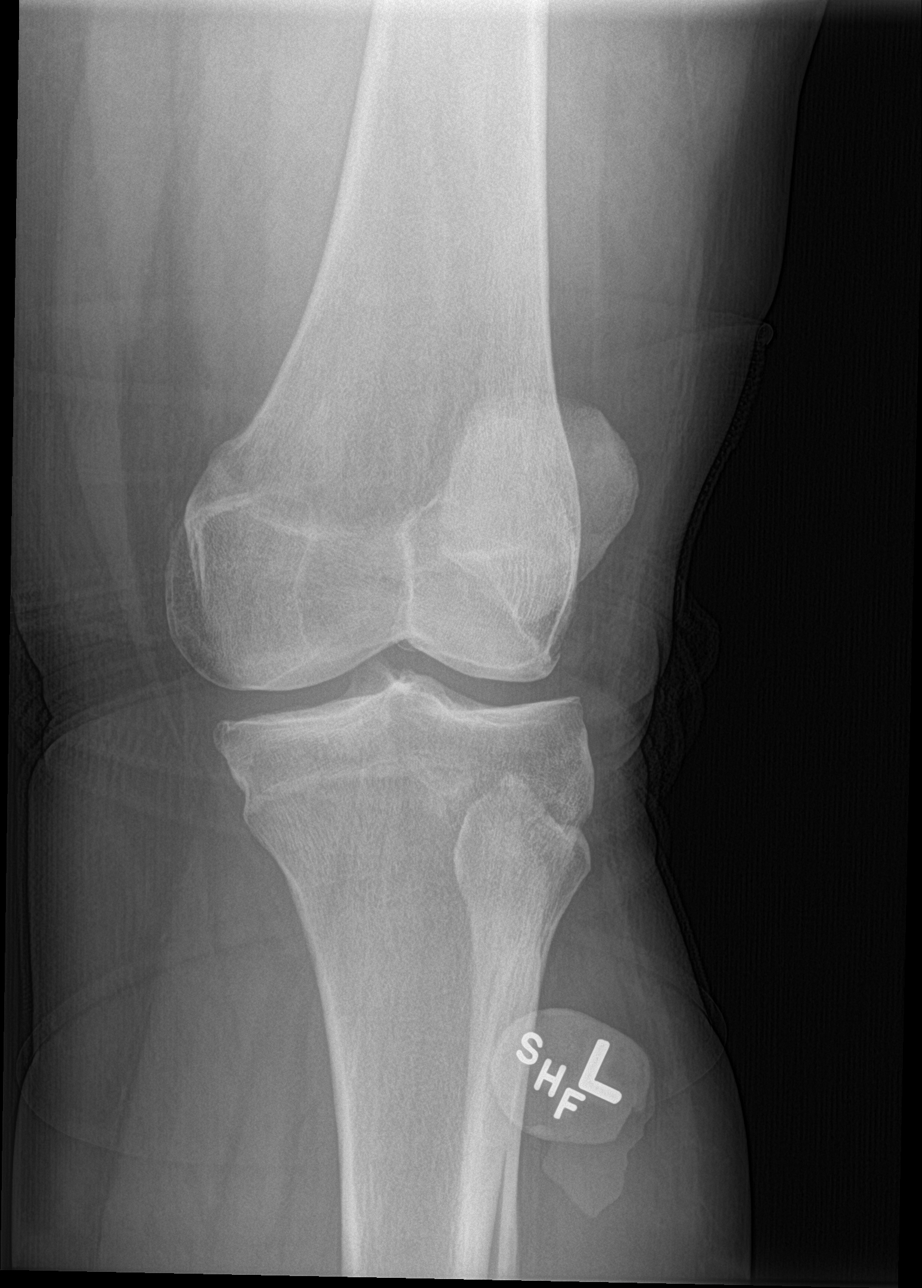

[4 of 4 positions shown; findings below may reference images not displayed]

FINDINGS: No evidence of fracture or dislocation. Limited evaluation for joint
effusion given obliquity of the lateral radiograph. Mild
patellofemoral and medial compartment degenerative change. Soft
tissues are unremarkable.
IMPRESSION: No evidence of acute fracture or malalignment. Limited evaluation
for joint effusion.

## 2021-10-08 ENCOUNTER — Ambulatory Visit (INDEPENDENT_AMBULATORY_CARE_PROVIDER_SITE_OTHER): Payer: BC Managed Care – PPO

## 2021-10-08 ENCOUNTER — Encounter: Payer: Self-pay | Admitting: Podiatry

## 2021-10-08 ENCOUNTER — Other Ambulatory Visit: Payer: Self-pay | Admitting: Family Medicine

## 2021-10-08 ENCOUNTER — Ambulatory Visit (INDEPENDENT_AMBULATORY_CARE_PROVIDER_SITE_OTHER): Payer: BC Managed Care – PPO | Admitting: Podiatry

## 2021-10-08 DIAGNOSIS — Z9889 Other specified postprocedural states: Secondary | ICD-10-CM

## 2021-10-08 DIAGNOSIS — M2011 Hallux valgus (acquired), right foot: Secondary | ICD-10-CM

## 2021-10-08 DIAGNOSIS — M21621 Bunionette of right foot: Secondary | ICD-10-CM

## 2021-10-08 NOTE — Progress Notes (Signed)
She presents today for follow-up of her bunion repair and fifth metatarsal osteotomy.  Date of surgery was 07/31/2021 right foot.  She states that it gets really tight by the end of the day when it swells up. ? ?Objective: Vital signs are stable alert and oriented x3.  Pulses are palpable.  She has good range of motion of the first metatarsophalangeal joint all incision sites of bony healing eventfully.  Radiographs taken today demonstrate a well-healed osteotomy internal fixation is in good position and intact. ? ?Assessment: Well-healing surgical foot. ? ?Plan: Offered her physical therapy for scar tissue mobilization.  She declined.  Encouraged at home mobilization and scar tissue.  I will follow-up with her in 1 month. ?

## 2021-10-15 DIAGNOSIS — F4323 Adjustment disorder with mixed anxiety and depressed mood: Secondary | ICD-10-CM | POA: Diagnosis not present

## 2021-11-05 ENCOUNTER — Ambulatory Visit (INDEPENDENT_AMBULATORY_CARE_PROVIDER_SITE_OTHER): Payer: BC Managed Care – PPO

## 2021-11-05 ENCOUNTER — Ambulatory Visit (INDEPENDENT_AMBULATORY_CARE_PROVIDER_SITE_OTHER): Payer: BC Managed Care – PPO | Admitting: Podiatry

## 2021-11-05 DIAGNOSIS — L603 Nail dystrophy: Secondary | ICD-10-CM | POA: Diagnosis not present

## 2021-11-05 DIAGNOSIS — M2011 Hallux valgus (acquired), right foot: Secondary | ICD-10-CM | POA: Diagnosis not present

## 2021-11-05 DIAGNOSIS — Z79899 Other long term (current) drug therapy: Secondary | ICD-10-CM | POA: Diagnosis not present

## 2021-11-05 MED ORDER — ITRACONAZOLE 100 MG PO CAPS: 100.0000 mg | ORAL_CAPSULE | Freq: Two times a day (BID) | ORAL | 0 refills | Status: DC

## 2021-11-05 NOTE — Progress Notes (Signed)
She presents today date of surgery 07/31/2021 [facility] bunionectomy and fifth metatarsal osteotomy on the right foot.  She denies fever chills nausea vomiting muscle aches and pains states that the foot still swells and still tender at times.  She is also questioning whether or not we can get started back on the toenail treatment.  Objective: Vital signs are stable she alert and oriented x3.  Pulses are palpable.  She has much decrease in edema better range of motion of the first metatarsal phalangeal joint.  Radiographs taken today demonstrate well-healing osteotomy internal fixation is intact 2 screws to the fifth metatarsal single screw to the first metatarsal.  Toes are rectus.  Previous labs do demonstrate onychomycosis.  Assessment: Well-healing surgical foot with onychomycosis.  Plan: Started her on itraconazole she has had recent blood work with a CMP which looked great.  Will allow her to get back to her regular activities with her foot and we did talk about a contrast bath today.  I will follow-up with her in 1 month for evaluation of her itraconazole as well as the surgery.

## 2021-11-10 DIAGNOSIS — F4323 Adjustment disorder with mixed anxiety and depressed mood: Secondary | ICD-10-CM | POA: Diagnosis not present

## 2021-12-02 NOTE — Progress Notes (Deleted)
Subjective:    Patient ID: Kim Fox, female    DOB: 18-Apr-1966, 56 y.o.   MRN: [id number]  No chief complaint on file.   HPI Patient is in today for a follow up.  Past Medical History:  Diagnosis Date   Depression    on meds   Dyslipidemia 10/17/2017   GERD (gastroesophageal reflux disease)    Headache(784.0)    menstral migraine   History of chicken pox 04/10/2015   Hypertension    on meds   Menorrhagia    Multiple renal cysts 08/09/2011   Murmur, cardiac    dx as a child-Echo WNL 2009   Obesity    Polycystic kidney disease    Preventative health care 04/20/2015   Right leg DVT ([place]) 2015   on meds for preventative tx 05/19/2021   Situational anxiety 10/17/2017   on meds   Urinary incontinence 10/28/2015    Past Surgical History:  Procedure Laterality Date   ENDOMETRIAL ABLATION  2016   ENDOMETRIAL BIOPSY  2010   KIDNEY CYST REMOVAL  2002   TONSILLECTOMY  1976   URETERAL STENT PLACEMENT  2002   WISDOM TOOTH EXTRACTION  1984    Family History  Problem Relation Age of Onset   Diabetes Mother        insulin dependent   Hypertension Mother    Migraines Mother    Varicose Veins Mother    Arthritis Mother    Heart disease Father    Diabetes Father    Hypertension Father    Kidney disease Father    Arthritis Father    Migraines Sister    Allergies Sister    Renal Cyst Sister    Cancer Sister        skin cancer   Dysfunctional uterine bleeding Sister    Fibromyalgia Sister    Renal Cyst Sister    Hypertension Sister    Dysfunctional uterine bleeding Sister    Hyperlipidemia Daughter    Cancer Paternal Grandmother        lung   Cancer Paternal Grandfather        bladder, prostate   Heart disease Paternal Grandfather    Colon cancer Neg Hx    Esophageal cancer Neg Hx    Stomach cancer Neg Hx    Rectal cancer Neg Hx    Colon polyps Neg Hx     Social History   Socioeconomic History   Marital status: Divorced    Spouse name: Not on  file   Number of children: 2   Years of education: college   Highest education level: Not on file  Occupational History   Occupation: Aeronautical engineer: [employer]  Tobacco Use   Smoking status: Never   Smokeless tobacco: Never  Vaping Use   Vaping Use: Never used  Substance and Sexual Activity   Alcohol use: Not Currently    Alcohol/week: 0.0 - 1.0 standard drinks of alcohol    Comment: occ   Drug use: No   Sexual activity: Not on file  Other Topics Concern   Not on file  Social History Narrative   Not on file   Social Determinants of Health   Financial Resource Strain: Not on file  Food Insecurity: Not on file  Transportation Needs: Not on file  Physical Activity: Not on file  Stress: Not on file  Social Connections: Not on file  Intimate Partner Violence: Not on file  Outpatient Medications Prior to Visit  Medication Sig Dispense Refill   aspirin EC 81 MG tablet Take 81 mg by mouth 2 (two) times daily.     atenolol (TENORMIN) 50 MG tablet TAKE 1 TABLET(50 MG) BY MOUTH DAILY 90 tablet 1   Biotin 5000 MCG CAPS Take 1 capsule by mouth daily.     Calcium Carbonate-Vitamin D (CALCIUM-VITAMIN D) 600-200 MG-UNIT CAPS Take 1 tablet by mouth 2 (two) times daily.     famotidine (PEPCID) 40 MG tablet TAKE 1/2 TO 1 TABLET BY MOUTH EVERY NIGHT AT BEDTIME AS NEEDED FOR HEARTBURN OR INDIGESTION 90 tablet 2   FLUoxetine (PROZAC) 20 MG tablet TAKE 1 TABLET(20 MG) BY MOUTH DAILY 90 tablet 1   folic acid (FOLVITE) 476 MCG tablet Take 400 mcg by mouth daily.     itraconazole (SPORANOX) 100 MG capsule Take 1 capsule (100 mg total) by mouth 2 (two) times daily. 60 capsule 0   L-Lysine 1000 MG TABS Take 1 tablet by mouth daily.     Lidocaine HCl (ASPERCREME LIDOCAINE) 4 % CREA Apply 1 Dose topically 2 (two) times daily as needed. 133 g 11   Multiple Vitamin (MULTIVITAMIN) tablet Take 1 tablet by mouth daily.     Omega 3-6-9 Fatty Acids (OMEGA 3-6-9 COMPLEX PO)  Take 2 capsules by mouth daily.     Probiotic Product (ACIDOPHILUS/GOAT MILK PO) Take 1 capsule by mouth daily at 6 (six) AM.     Semaglutide-Weight Management (WEGOVY) 0.25 MG/0.5ML SOAJ Inject 0.25 mg into the skin once a week. 2 mL 0   vitamin B-12 (CYANOCOBALAMIN) 500 MCG tablet Take 500 mcg by mouth daily.     VITAMIN D PO Take 1 capsule by mouth daily at 6 (six) AM.     No facility-administered medications prior to visit.    Allergies  Allergen Reactions   Nitrofurantoin Rash   Macrodantin Rash   Penicillins Rash    ROS     Objective:    Physical Exam  There were no vitals taken for this visit. Wt Readings from Last 3 Encounters:  07/27/21 286 lb 9.6 oz (130 kg)  06/02/21 282 lb (127.9 kg)  05/19/21 282 lb (127.9 kg)    Diabetic Foot Exam - Simple   No data filed    Lab Results  Component Value Date   WBC 7.3 07/27/2021   HGB 14.8 07/27/2021   HCT 45.6 07/27/2021   PLT 220.0 07/27/2021   GLUCOSE 101 (H) 07/27/2021   CHOL 199 07/27/2021   TRIG 133.0 07/27/2021   HDL 52.60 07/27/2021   LDLDIRECT 117.0 07/27/2018   LDLCALC 120 (H) 07/27/2021   ALT 15 07/27/2021   AST 20 07/27/2021   NA 140 07/27/2021   K 4.2 07/27/2021   CL 103 07/27/2021   CREATININE 0.91 07/27/2021   BUN 25 (H) 07/27/2021   CO2 31 07/27/2021   TSH 1.34 07/27/2021   INR 1.03 03/25/2014   HGBA1C 6.1 07/27/2021   MICROALBUR 1.0 10/17/2017    Lab Results  Component Value Date   TSH 1.34 07/27/2021   Lab Results  Component Value Date   WBC 7.3 07/27/2021   HGB 14.8 07/27/2021   HCT 45.6 07/27/2021   MCV 86.7 07/27/2021   PLT 220.0 07/27/2021   Lab Results  Component Value Date   NA 140 07/27/2021   K 4.2 07/27/2021   CHLORIDE 108 10/07/2016   CO2 31 07/27/2021   GLUCOSE 101 (H) 07/27/2021   BUN 25 (H)  07/27/2021   CREATININE 0.91 07/27/2021   BILITOT 0.7 07/27/2021   ALKPHOS 74 07/27/2021   AST 20 07/27/2021   ALT 15 07/27/2021   PROT 7.3 07/27/2021   ALBUMIN 3.9  07/27/2021   CALCIUM 9.5 07/27/2021   ANIONGAP 8 10/07/2016   EGFR 80 (L) 10/07/2016   GFR 71.68 07/27/2021   Lab Results  Component Value Date   CHOL 199 07/27/2021   Lab Results  Component Value Date   HDL 52.60 07/27/2021   Lab Results  Component Value Date   LDLCALC 120 (H) 07/27/2021   Lab Results  Component Value Date   TRIG 133.0 07/27/2021   Lab Results  Component Value Date   CHOLHDL 4 07/27/2021   Lab Results  Component Value Date   HGBA1C 6.1 07/27/2021       Assessment & Plan:     Problem List Items Addressed This Visit   None   I am having Kim Fox "Kim Fox" maintain her L-Lysine, Biotin, multivitamin, Calcium-Vitamin D, folic acid, Probiotic Product (ACIDOPHILUS/GOAT MILK PO), aspirin EC, vitamin B-12, Omega 3-6-9 Fatty Acids (OMEGA 3-6-9 COMPLEX PO), VITAMIN D PO, Lidocaine HCl, famotidine, Wegovy, atenolol, FLUoxetine, and itraconazole.  No orders of the defined types were placed in this encounter.

## 2021-12-03 ENCOUNTER — Ambulatory Visit: Payer: BC Managed Care – PPO | Admitting: Family Medicine

## 2021-12-03 ENCOUNTER — Encounter: Payer: BC Managed Care – PPO | Admitting: Podiatry

## 2021-12-03 DIAGNOSIS — E785 Hyperlipidemia, unspecified: Secondary | ICD-10-CM

## 2021-12-03 DIAGNOSIS — D751 Secondary polycythemia: Secondary | ICD-10-CM

## 2021-12-03 DIAGNOSIS — R739 Hyperglycemia, unspecified: Secondary | ICD-10-CM

## 2021-12-03 DIAGNOSIS — I10 Essential (primary) hypertension: Secondary | ICD-10-CM

## 2021-12-04 ENCOUNTER — Ambulatory Visit (INDEPENDENT_AMBULATORY_CARE_PROVIDER_SITE_OTHER): Payer: BC Managed Care – PPO | Admitting: Podiatry

## 2021-12-04 DIAGNOSIS — L03031 Cellulitis of right toe: Secondary | ICD-10-CM | POA: Diagnosis not present

## 2021-12-04 DIAGNOSIS — Z79899 Other long term (current) drug therapy: Secondary | ICD-10-CM

## 2021-12-04 MED ORDER — ITRACONAZOLE 100 MG PO CAPS: 100.0000 mg | ORAL_CAPSULE | Freq: Every day | ORAL | 0 refills | Status: DC

## 2021-12-04 MED ORDER — ITRACONAZOLE 100 MG PO CAPS: 100.0000 mg | ORAL_CAPSULE | Freq: Two times a day (BID) | ORAL | 0 refills | Status: DC

## 2021-12-04 MED ORDER — DOXYCYCLINE HYCLATE 100 MG PO TABS: 100.0000 mg | ORAL_TABLET | Freq: Two times a day (BID) | ORAL | 0 refills | Status: AC

## 2021-12-09 NOTE — Progress Notes (Signed)
She presents today date of surgery 07/31/2021 [facility] bunionectomy and fifth metatarsal osteotomy on the right foot.  She is doing well from the surgical site.  Denies any nausea fever chills vomiting.  She is also here to get itraconazole refill as it has helped with her nail.  There is mild paronychia present around the hallux.  She is not taking any antibiotics  Objective: Vital signs are stable she alert and oriented x3.  Pulses are palpable.  She has much decrease in edema better range of motion of the first metatarsal phalangeal joint.  Radiographs taken today demonstrate well-healing osteotomy internal fixation is intact 2 screws to the fifth metatarsal single screw to the first metatarsal.  Toes are rectus.  Mild paronychia noted without any signs of wounds or pus.  Previous labs do demonstrate onychomycosis.  Assessment: Well-healing surgical foot with onychomycosis.  Plan: Itraconazole was refilled.  Surgical site is doing well.  Mild paronychia noted to the right hallux.  Patient was placed on doxycycline as she is leaving for the beach.  If it continues to get worse have asked her to go to the emergency room.

## 2022-01-01 NOTE — Progress Notes (Unsigned)
Subjective:    Patient ID: Kim Fox, female    DOB: 18-Apr-1966, 56 y.o.   MRN: [id number]  No chief complaint on file.   HPI Patient is in today for a follow up.  Past Medical History:  Diagnosis Date   Depression    on meds   Dyslipidemia 10/17/2017   GERD (gastroesophageal reflux disease)    Headache(784.0)    menstral migraine   History of chicken pox 04/10/2015   Hypertension    on meds   Menorrhagia    Multiple renal cysts 08/09/2011   Murmur, cardiac    dx as a child-Echo WNL 2009   Obesity    Polycystic kidney disease    Preventative health care 04/20/2015   Right leg DVT ([place]) 2015   on meds for preventative tx 05/19/2021   Situational anxiety 10/17/2017   on meds   Urinary incontinence 10/28/2015    Past Surgical History:  Procedure Laterality Date   ENDOMETRIAL ABLATION  2016   ENDOMETRIAL BIOPSY  2010   KIDNEY CYST REMOVAL  2002   TONSILLECTOMY  1976   URETERAL STENT PLACEMENT  2002   WISDOM TOOTH EXTRACTION  1984    Family History  Problem Relation Age of Onset   Diabetes Mother        insulin dependent   Hypertension Mother    Migraines Mother    Varicose Veins Mother    Arthritis Mother    Heart disease Father    Diabetes Father    Hypertension Father    Kidney disease Father    Arthritis Father    Migraines Sister    Allergies Sister    Renal Cyst Sister    Cancer Sister        skin cancer   Dysfunctional uterine bleeding Sister    Fibromyalgia Sister    Renal Cyst Sister    Hypertension Sister    Dysfunctional uterine bleeding Sister    Hyperlipidemia Daughter    Cancer Paternal Grandmother        lung   Cancer Paternal Grandfather        bladder, prostate   Heart disease Paternal Grandfather    Colon cancer Neg Hx    Esophageal cancer Neg Hx    Stomach cancer Neg Hx    Rectal cancer Neg Hx    Colon polyps Neg Hx     Social History   Socioeconomic History   Marital status: Divorced    Spouse name: Not on  file   Number of children: 2   Years of education: college   Highest education level: Not on file  Occupational History   Occupation: Aeronautical engineer: [employer]  Tobacco Use   Smoking status: Never   Smokeless tobacco: Never  Vaping Use   Vaping Use: Never used  Substance and Sexual Activity   Alcohol use: Not Currently    Alcohol/week: 0.0 - 1.0 standard drinks of alcohol    Comment: occ   Drug use: No   Sexual activity: Not on file  Other Topics Concern   Not on file  Social History Narrative   Not on file   Social Determinants of Health   Financial Resource Strain: Not on file  Food Insecurity: Not on file  Transportation Needs: Not on file  Physical Activity: Not on file  Stress: Not on file  Social Connections: Not on file  Intimate Partner Violence: Not on file  Outpatient Medications Prior to Visit  Medication Sig Dispense Refill   aspirin EC 81 MG tablet Take 81 mg by mouth 2 (two) times daily.     atenolol (TENORMIN) 50 MG tablet TAKE 1 TABLET(50 MG) BY MOUTH DAILY 90 tablet 1   Biotin 5000 MCG CAPS Take 1 capsule by mouth daily.     Calcium Carbonate-Vitamin D (CALCIUM-VITAMIN D) 600-200 MG-UNIT CAPS Take 1 tablet by mouth 2 (two) times daily.     famotidine (PEPCID) 40 MG tablet TAKE 1/2 TO 1 TABLET BY MOUTH EVERY NIGHT AT BEDTIME AS NEEDED FOR HEARTBURN OR INDIGESTION 90 tablet 2   FLUoxetine (PROZAC) 20 MG tablet TAKE 1 TABLET(20 MG) BY MOUTH DAILY 90 tablet 1   folic acid (FOLVITE) 800 MCG tablet Take 400 mcg by mouth daily.     itraconazole (SPORANOX) 100 MG capsule Take 1 capsule (100 mg total) by mouth daily. 60 capsule 0   L-Lysine 1000 MG TABS Take 1 tablet by mouth daily.     Lidocaine HCl (ASPERCREME LIDOCAINE) 4 % CREA Apply 1 Dose topically 2 (two) times daily as needed. 133 g 11   Multiple Vitamin (MULTIVITAMIN) tablet Take 1 tablet by mouth daily.     Omega 3-6-9 Fatty Acids (OMEGA 3-6-9 COMPLEX PO) Take 2 capsules  by mouth daily.     Probiotic Product (ACIDOPHILUS/GOAT MILK PO) Take 1 capsule by mouth daily at 6 (six) AM.     Semaglutide-Weight Management (WEGOVY) 0.25 MG/0.5ML SOAJ Inject 0.25 mg into the skin once a week. 2 mL 0   vitamin B-12 (CYANOCOBALAMIN) 500 MCG tablet Take 500 mcg by mouth daily.     VITAMIN D PO Take 1 capsule by mouth daily at 6 (six) AM.     No facility-administered medications prior to visit.    Allergies  Allergen Reactions   Nitrofurantoin Rash   Macrodantin Rash   Penicillins Rash    ROS     Objective:    Physical Exam  There were no vitals taken for this visit. Wt Readings from Last 3 Encounters:  07/27/21 286 lb 9.6 oz (130 kg)  06/02/21 282 lb (127.9 kg)  05/19/21 282 lb (127.9 kg)    Diabetic Foot Exam - Simple   No data filed    Lab Results  Component Value Date   WBC 7.3 07/27/2021   HGB 14.8 07/27/2021   HCT 45.6 07/27/2021   PLT 220.0 07/27/2021   GLUCOSE 101 (H) 07/27/2021   CHOL 199 07/27/2021   TRIG 133.0 07/27/2021   HDL 52.60 07/27/2021   LDLDIRECT 117.0 07/27/2018   LDLCALC 120 (H) 07/27/2021   ALT 15 07/27/2021   AST 20 07/27/2021   NA 140 07/27/2021   K 4.2 07/27/2021   CL 103 07/27/2021   CREATININE 0.91 07/27/2021   BUN 25 (H) 07/27/2021   CO2 31 07/27/2021   TSH 1.34 07/27/2021   INR 1.03 03/25/2014   HGBA1C 6.1 07/27/2021   MICROALBUR 1.0 10/17/2017    Lab Results  Component Value Date   TSH 1.34 07/27/2021   Lab Results  Component Value Date   WBC 7.3 07/27/2021   HGB 14.8 07/27/2021   HCT 45.6 07/27/2021   MCV 86.7 07/27/2021   PLT 220.0 07/27/2021   Lab Results  Component Value Date   NA 140 07/27/2021   K 4.2 07/27/2021   CHLORIDE 108 10/07/2016   CO2 31 07/27/2021   GLUCOSE 101 (H) 07/27/2021   BUN 25 (H) 07/27/2021  CREATININE 0.91 07/27/2021   BILITOT 0.7 07/27/2021   ALKPHOS 74 07/27/2021   AST 20 07/27/2021   ALT 15 07/27/2021   PROT 7.3 07/27/2021   ALBUMIN 3.9 07/27/2021    CALCIUM 9.5 07/27/2021   ANIONGAP 8 10/07/2016   EGFR 80 (L) 10/07/2016   GFR 71.68 07/27/2021   Lab Results  Component Value Date   CHOL 199 07/27/2021   Lab Results  Component Value Date   HDL 52.60 07/27/2021   Lab Results  Component Value Date   LDLCALC 120 (H) 07/27/2021   Lab Results  Component Value Date   TRIG 133.0 07/27/2021   Lab Results  Component Value Date   CHOLHDL 4 07/27/2021   Lab Results  Component Value Date   HGBA1C 6.1 07/27/2021       Assessment & Plan:      Problem List Items Addressed This Visit   None   I am having Kim Fox "Cathy" maintain her L-Lysine, Biotin, multivitamin, Calcium-Vitamin D, folic acid, Probiotic Product (ACIDOPHILUS/GOAT MILK PO), aspirin EC, cyanocobalamin, Omega 3-6-9 Fatty Acids (OMEGA 3-6-9 COMPLEX PO), VITAMIN D PO, Lidocaine HCl, famotidine, Wegovy, atenolol, FLUoxetine, and itraconazole.  No orders of the defined types were placed in this encounter.

## 2022-01-04 ENCOUNTER — Ambulatory Visit: Payer: BC Managed Care – PPO | Admitting: Family Medicine

## 2022-01-04 ENCOUNTER — Encounter: Payer: Self-pay | Admitting: Family Medicine

## 2022-01-04 VITALS — BP 132/86 | HR 54 | Resp 20 | Ht 67.0 in | Wt 282.8 lb

## 2022-01-04 DIAGNOSIS — E6609 Other obesity due to excess calories: Secondary | ICD-10-CM

## 2022-01-04 DIAGNOSIS — Z23 Encounter for immunization: Secondary | ICD-10-CM

## 2022-01-04 DIAGNOSIS — I10 Essential (primary) hypertension: Secondary | ICD-10-CM

## 2022-01-04 DIAGNOSIS — E785 Hyperlipidemia, unspecified: Secondary | ICD-10-CM

## 2022-01-04 DIAGNOSIS — E559 Vitamin D deficiency, unspecified: Secondary | ICD-10-CM | POA: Diagnosis not present

## 2022-01-04 DIAGNOSIS — F418 Other specified anxiety disorders: Secondary | ICD-10-CM

## 2022-01-04 DIAGNOSIS — R739 Hyperglycemia, unspecified: Secondary | ICD-10-CM | POA: Diagnosis not present

## 2022-01-04 DIAGNOSIS — M21611 Bunion of right foot: Secondary | ICD-10-CM

## 2022-01-04 DIAGNOSIS — M21612 Bunion of left foot: Secondary | ICD-10-CM

## 2022-01-04 DIAGNOSIS — D751 Secondary polycythemia: Secondary | ICD-10-CM

## 2022-01-04 DIAGNOSIS — N951 Menopausal and female climacteric states: Secondary | ICD-10-CM

## 2022-01-04 LAB — COMPREHENSIVE METABOLIC PANEL
ALT: 15 U/L (ref 0–35)
AST: 20 U/L (ref 0–37)
Albumin: 4 g/dL (ref 3.5–5.2)
Alkaline Phosphatase: 86 U/L (ref 39–117)
BUN: 16 mg/dL (ref 6–23)
CO2: 30 mEq/L (ref 19–32)
Calcium: 9.2 mg/dL (ref 8.4–10.5)
Chloride: 105 mEq/L (ref 96–112)
Creatinine, Ser: 0.83 mg/dL (ref 0.40–1.20)
GFR: 79.8 mL/min (ref >60.00)
Glucose, Bld: 86 mg/dL (ref 70–99)
Potassium: 4.7 mEq/L (ref 3.5–5.1)
Sodium: 141 mEq/L (ref 135–145)
Total Bilirubin: 0.5 mg/dL (ref 0.2–1.2)
Total Protein: 7.2 g/dL (ref 6.0–8.3)

## 2022-01-04 LAB — CBC
HCT: 47 % — ABNORMAL HIGH (ref 36.0–46.0)
Hemoglobin: 15.4 g/dL — ABNORMAL HIGH (ref 12.0–15.0)
MCHC: 32.8 g/dL (ref 30.0–36.0)
MCV: 87.8 fl (ref 78.0–100.0)
Platelets: 213 10*3/uL (ref 150.0–400.0)
RBC: 5.35 Mil/uL — ABNORMAL HIGH (ref 3.87–5.11)
RDW: 14.1 % (ref 11.5–15.5)
WBC: 7.3 10*3/uL (ref 4.0–10.5)

## 2022-01-04 LAB — TSH: TSH: 0.86 u[IU]/mL (ref 0.35–5.50)

## 2022-01-04 LAB — LIPID PANEL
Cholesterol: 157 mg/dL (ref 0–200)
HDL: 60.2 mg/dL (ref >39.00)
LDL Cholesterol: 72 mg/dL (ref 0–99)
NonHDL: 97.19
Total CHOL/HDL Ratio: 3
Triglycerides: 127 mg/dL (ref 0.0–149.0)
VLDL: 25.4 mg/dL (ref 0.0–40.0)

## 2022-01-04 LAB — VITAMIN D 25 HYDROXY (VIT D DEFICIENCY, FRACTURES): VITD: 48.2 ng/mL (ref 30.00–100.00)

## 2022-01-04 LAB — HEMOGLOBIN A1C: Hgb A1c MFr Bld: 6 % (ref 4.6–6.5)

## 2022-01-04 MED ORDER — WEGOVY 0.5 MG/0.5ML ~~LOC~~ SOAJ: 0.5000 mg | SUBCUTANEOUS | 0 refills | Status: DC

## 2022-01-04 MED ORDER — WEGOVY 1 MG/0.5ML ~~LOC~~ SOAJ: 1.0000 mg | SUBCUTANEOUS | 0 refills | Status: DC

## 2022-01-04 MED ORDER — TIZANIDINE HCL 2 MG PO TABS: 1.0000 mg | ORAL_TABLET | Freq: Two times a day (BID) | ORAL | 1 refills | Status: AC | PRN

## 2022-01-04 NOTE — Assessment & Plan Note (Signed)
Well controlled, no changes to meds. Encouraged heart healthy diet such as the DASH diet and exercise as tolerated.  °

## 2022-01-04 NOTE — Assessment & Plan Note (Signed)
hgba1c acceptable, minimize simple carbs. Increase exercise as tolerated.  

## 2022-01-04 NOTE — Assessment & Plan Note (Signed)
Has just finished her first month of Wegovy and it is tolerating it. Will increase to 0.5 weekly then 1 mg weekly next month

## 2022-01-04 NOTE — Assessment & Plan Note (Signed)
Had surgery on lateral and medial bunion on right foot this year and is slowly recovering. Following with Dr Milinda Pointer. Plans left foot bunion surgery next year

## 2022-01-04 NOTE — Assessment & Plan Note (Signed)
Doing well on Fluoxetine

## 2022-01-04 NOTE — Assessment & Plan Note (Signed)
Supplement and monitor 

## 2022-01-04 NOTE — Assessment & Plan Note (Signed)
Hydrate and monitor 

## 2022-01-04 NOTE — Patient Instructions (Addendum)
Encouraged moist heat and gentle stretching as tolerated. May try NSAIDs and prescription meds as directed and report if symptoms worsen or seek immediate care.  CBD Daily cream, extra strength, mint online    Allbirds and Roka sneakers   Yerba Matte tea do not drink  Bunion Surgery, Care After This sheet gives you information about how to care for yourself after your procedure. Your health care provider may also give you more specific instructions. If you have problems or questions, contact your health care provider. What can I expect after the procedure? After the procedure, it is common to have: Redness. Pain. Swelling. A small amount of fluid coming from your incision. Follow these instructions at home: If you have a post-operative brace, boot, or shoe: Wear the post-op (post-operative) brace, boot, or shoe as told by your health care provider. Remove it only as told by your health care provider. Loosen the brace, boot, or shoe if your toes tingle, become numb, or turn cold and blue. Keep the brace, boot, or shoe clean and dry. If you have a cast: Do not put pressure on any part of the cast until it is fully hardened. This may take several hours. Do not stick anything inside the cast to scratch your skin. Doing that increases your risk of infection. Check the skin around the cast every day. Tell your health care provider about any concerns. You may put lotion on dry skin around the edges of the cast. Do not put lotion on the skin underneath the cast. Keep the cast clean and dry. Bathing Do not take baths, swim, or use a hot tub until your health care provider approves. Ask your health care provider if you may take showers. If your brace, boot, shoe, or cast is not waterproof: Do not let it get wet. Cover it with a watertight covering when you take a bath or a shower. Keep your bandage (dressing) dry until your health care provider says it can be removed. Incision care  The  dressing holds your toe in the correct position. Do not change the dressing until your health care provider approves. Follow instructions from your health care provider about how to take care of your incision. Make sure you: Wash your hands with soap and water for at least 20 seconds before and after you change your dressing. If soap and water are not available, use hand sanitizer. Change your dressing as told by your health care provider. Leave stitches (sutures), skin glue, or adhesive strips in place. These skin closures may need to stay in place for 2 weeks or longer. If adhesive strip edges start to loosen and curl up, you may trim the loose edges. Do not remove adhesive strips completely unless your health care provider tells you to do that. Check your incision area every day for signs of infection. Check for: More redness, swelling, or pain. Blood or more fluid. Warmth. Pus or a bad smell. Managing pain, stiffness, and swelling  If directed, put ice on the affected area. To do this: If you have a removable brace, boot, or shoe, remove it as told by your health care provider. Put ice in a plastic bag. Place a towel between your skin and the bag or between your cast and the bag. Leave the ice on for 20 minutes, 2-3 times a day. Remove the ice if your skin turns bright red. This is very important. If you cannot feel pain, heat, or cold, you have a greater risk of  damage to the area. Move your toes often to reduce stiffness and swelling. Raise (elevate) the injured area above the level of your heart while you are sitting or lying down. Activity Return to your normal activities as told by your health care provider. Ask your health care provider what activities are safe for you. If physical therapy was prescribed, do exercises as told by your health care provider. Driving If you were given a sedative during the procedure, it can affect you for several hours. Do not drive or operate machinery  until your health care provider says that it is safe. Ask your health care provider when it is safe to drive if you have a brace, boot, shoe, or cast on your foot. Safety Do not use the affected leg to support (bear) your body weight until your health care provider says that you can. Follow weight-bearing restrictions as told. Use crutches, a cane, or a walker as told by your health care provider. General instructions Do not use any products that contain nicotine or tobacco, such as cigarettes, e-cigarettes, and chewing tobacco. If you need help quitting, ask your health care provider. Take over-the-counter and prescription medicines only as told by your health care provider. Your medicines may cause constipation. To prevent or treat constipation, you may need to: Drink enough fluid to keep your urine pale yellow. Take over-the-counter or prescription medicines. Eat foods that are high in fiber, such as beans, whole grains, and fresh fruits and vegetables. Limit foods that are high in fat and processed sugars, such as fried or sweet foods. Do not wear high heels or tight-fitting shoes, even after you heal. Keep all follow-up visits. This is important. Contact a health care provider if: You have any of these signs of infection: More redness, swelling, or pain around your incision. Blood or more fluid coming from your incision. Warmth coming from your incision. Pus or a bad smell coming from your incision. A fever or chills. Your dressing gets wet or it falls off. You have swelling in your lower leg or calf. You have numbness or stiffness in your toes. Get help right away if: You have severe pain. You have shortness of breath or trouble breathing. You have chest pain. You have redness, swelling, pain, or warmth in your calf or leg. These symptoms may represent a serious problem that is an emergency. Do not wait to see if the symptoms will go away. Get medical help right away. Call your  local emergency services (911 in the [place] Do not drive yourself to the hospital. Summary If directed, put ice on the affected area. Leave the ice on for 20 minutes, 2-3 times a day. Ask your health care provider when it is safe to drive if you have a brace, boot, shoe, or cast on your foot. Do not use the affected leg to support (bear) your body weight until your health care provider says that you can. Follow weight-bearing restrictions as told. Use crutches, a cane, or a walker as told by your health care provider. This information is not intended to replace advice given to you by your health care provider. Make sure you discuss any questions you have with your health care provider. Document Revised: 10/12/2019 Document Reviewed: 10/12/2019 Elsevier Patient Education  [place].

## 2022-01-04 NOTE — Assessment & Plan Note (Signed)
Still has an occasional hot flashes did have an endometrial ablation in 2016 so no cycles

## 2022-01-05 ENCOUNTER — Other Ambulatory Visit: Payer: Self-pay

## 2022-01-05 ENCOUNTER — Ambulatory Visit (INDEPENDENT_AMBULATORY_CARE_PROVIDER_SITE_OTHER): Payer: BC Managed Care – PPO

## 2022-01-05 ENCOUNTER — Ambulatory Visit: Payer: BC Managed Care – PPO | Admitting: Podiatry

## 2022-01-05 ENCOUNTER — Encounter: Payer: Self-pay | Admitting: Podiatry

## 2022-01-05 DIAGNOSIS — M2011 Hallux valgus (acquired), right foot: Secondary | ICD-10-CM

## 2022-01-05 DIAGNOSIS — Z9889 Other specified postprocedural states: Secondary | ICD-10-CM

## 2022-01-05 DIAGNOSIS — M21621 Bunionette of right foot: Secondary | ICD-10-CM | POA: Diagnosis not present

## 2022-01-05 DIAGNOSIS — L603 Nail dystrophy: Secondary | ICD-10-CM | POA: Diagnosis not present

## 2022-01-05 MED ORDER — ITRACONAZOLE 100 MG PO CAPS
100.0000 mg | ORAL_CAPSULE | Freq: Two times a day (BID) | ORAL | 0 refills | Status: DC
Start: 2022-01-05 — End: 2022-04-06

## 2022-01-05 MED ORDER — WEGOVY 0.25 MG/0.5ML ~~LOC~~ SOAJ
0.2500 mg | SUBCUTANEOUS | 0 refills | Status: DC
Start: 2022-01-05 — End: 2022-02-26

## 2022-01-05 NOTE — Progress Notes (Signed)
She presents today date of surgery July 31, 2021 Kim Fox bunionectomy fifth metatarsal osteotomy states that it hurts on the bottom sometimes as she points to the sesamoid area on the bottom she has been taking her medication and states that is been doing just perfectly she has no problems taking that no fever chills nausea vomiting muscle aches pains itching or rashes.  States her primary care provider has just done blood work yesterday.  Objective: Vital signs are stable alert oriented x3 no erythema cellulitis drainage or odor some mild edema about the first metatarsophalangeal joint with some tenderness on range of motion around the sesamoid area.  Radiographically it appears that there is some bony resorption along the resection of the hypertrophic medial condyle other than that it appears to be doing quite well.  The osteotomies have gone on to heal uneventfully and there is no problems with internal fixation.  Nails appear to be healing there is a line of delineation across the nails with movement of the onychomycosis distally.  Assessment well-healing osteotomies right foot.  Well-healing onychomycosis right foot.  Plan: We will continue the use of the itraconazole 100 mg twice a day for the next 3 months I will follow-up with her in 3 months.

## 2022-01-18 DIAGNOSIS — F4323 Adjustment disorder with mixed anxiety and depressed mood: Secondary | ICD-10-CM | POA: Diagnosis not present

## 2022-02-26 ENCOUNTER — Encounter: Payer: Self-pay | Admitting: Family Medicine

## 2022-02-26 ENCOUNTER — Other Ambulatory Visit: Payer: Self-pay | Admitting: Family Medicine

## 2022-02-26 MED ORDER — WEGOVY 1.7 MG/0.75ML ~~LOC~~ SOAJ: 1.7000 mg | SUBCUTANEOUS | 0 refills | Status: DC

## 2022-03-02 ENCOUNTER — Telehealth: Payer: Self-pay

## 2022-03-02 ENCOUNTER — Other Ambulatory Visit: Payer: Self-pay

## 2022-03-02 DIAGNOSIS — I10 Essential (primary) hypertension: Secondary | ICD-10-CM

## 2022-03-02 DIAGNOSIS — F4323 Adjustment disorder with mixed anxiety and depressed mood: Secondary | ICD-10-CM | POA: Diagnosis not present

## 2022-03-02 MED ORDER — ATENOLOL 50 MG PO TABS: ORAL_TABLET | ORAL | 1 refills | Status: DC

## 2022-03-02 NOTE — Telephone Encounter (Signed)
PA initiated via Covermymeds; KEY: [id number]. Awaiting determination.

## 2022-03-02 NOTE — Telephone Encounter (Signed)
PA approved.    [id number];[id number];Review Type:Prior Auth;Coverage Start Date:01/31/2022;Coverage End Date:09/28/2022;

## 2022-03-04 ENCOUNTER — Other Ambulatory Visit: Payer: Self-pay

## 2022-03-04 DIAGNOSIS — I10 Essential (primary) hypertension: Secondary | ICD-10-CM

## 2022-03-04 MED ORDER — ATENOLOL 50 MG PO TABS: ORAL_TABLET | ORAL | 1 refills | Status: DC

## 2022-03-23 ENCOUNTER — Encounter: Payer: Self-pay | Admitting: Family Medicine

## 2022-03-23 ENCOUNTER — Other Ambulatory Visit: Payer: Self-pay | Admitting: Family Medicine

## 2022-03-23 MED ORDER — SEMAGLUTIDE-WEIGHT MANAGEMENT 2.4 MG/0.75ML ~~LOC~~ SOAJ: 2.4000 mg | SUBCUTANEOUS | 3 refills | Status: DC

## 2022-03-24 ENCOUNTER — Telehealth: Payer: Self-pay

## 2022-03-24 ENCOUNTER — Telehealth: Payer: BC Managed Care – PPO | Admitting: Family Medicine

## 2022-03-24 ENCOUNTER — Encounter: Payer: Self-pay | Admitting: Family Medicine

## 2022-03-24 DIAGNOSIS — H8110 Benign paroxysmal vertigo, unspecified ear: Secondary | ICD-10-CM | POA: Diagnosis not present

## 2022-03-24 NOTE — Telephone Encounter (Signed)
Pt has appt today with Dr.Wendling

## 2022-03-24 NOTE — Progress Notes (Signed)
Chief Complaint  Patient presents with   Dizziness    Headache this morning    Kim Fox is 56 y.o. pt here for dizziness. Due to COVID-19 pandemic, we are interacting via web portal for an electronic face-to-face visit. I verified patient's ID using 2 identifiers. Patient agreed to proceed with visit via this method. Patient is at home, I am at office. Patient and I are present for visit.   Duration: 3 days Pass out? No Spinning? Yes, slight Lasts for a few seconds after head movement.  Recent illness/fever? No Headache? Yes; not a/w dizziness Neurologic signs? No vision changes, difficulty swallowing, trouble w speech, weakness, paresthesias Change in PO intake? No Palpitations? No  Past Medical History:  Diagnosis Date   Depression    on meds   Dyslipidemia 10/17/2017   GERD (gastroesophageal reflux disease)    Headache(784.0)    menstral migraine   History of chicken pox 04/10/2015   Hypertension    on meds   Menorrhagia    Multiple renal cysts 08/09/2011   Murmur, cardiac    dx as a child-Echo WNL 2009   Obesity    Polycystic kidney disease    Preventative health care 04/20/2015   Right leg DVT ([place]) 2015   on meds for preventative tx 05/19/2021   Situational anxiety 10/17/2017   on meds   Urinary incontinence 10/28/2015    Family History  Problem Relation Age of Onset   Diabetes Mother        insulin dependent   Hypertension Mother    Migraines Mother    Varicose Veins Mother    Arthritis Mother    Heart disease Father    Diabetes Father    Hypertension Father    Kidney disease Father    Arthritis Father    Migraines Sister    Allergies Sister    Renal Cyst Sister    Cancer Sister        skin cancer   Dysfunctional uterine bleeding Sister    Fibromyalgia Sister    Renal Cyst Sister    Hypertension Sister    Dysfunctional uterine bleeding Sister    Hyperlipidemia Daughter    Cancer Paternal Grandmother        lung   Cancer Paternal  Grandfather        bladder, prostate   Heart disease Paternal Grandfather    Colon cancer Neg Hx    Esophageal cancer Neg Hx    Stomach cancer Neg Hx    Rectal cancer Neg Hx    Colon polyps Neg Hx    Exam No conversational dyspnea Age appropriate judgment and insight Nml affect and mood  Benign paroxysmal positional vertigo, unspecified laterality  Epley maneuver info sent via MyChart. Push fluids. Send message in 2 d, will get set up w vest rehab. F/u prn. Pt voiced understanding and agreement to the plan.  Holl[place], DO 03/24/22 3:06 PM

## 2022-03-24 NOTE — Telephone Encounter (Signed)
Nurse Assessment Nurse: Lucky Cowboy, RN, Levada Dy Date/Time (Eastern Time): 03/24/2022 7:43:00 AM Confirm and document reason for call. If symptomatic, describe symptoms. ---Caller stated that she has some pressure in her head. She feels like falling over when she turns her head and gets off-balance and dizzy. Her s/s started on Sunday and have been there every morning. BP 142/86 this morning. Does the patient have any new or worsening symptoms? ---Yes Will a triage be completed? ---Yes Related visit to physician within the last 2 weeks? ---No Does the PT have any chronic conditions? (i.e. diabetes, asthma, this includes High risk factors for pregnancy, etc.) ---Yes List chronic conditions. ---HTN, weight loss med, toe fungus med Is the patient pregnant or possibly pregnant? (Ask all females between the ages of 80-55) ---No Is this a behavioral health or substance abuse call? ---No Guidelines Guideline Title Affirmed Question Affirmed Notes Nurse Date/Time (Eastern Time) Dizziness - Lightheadedness SEVERE dizziness (e.g., unable to stand, requires support to walk, feels like passing out now) Lucky Cowboy, Therapist, sports, Levada Dy 03/24/2022 7:46:05 AM PLEASE NOTE: All timestamps contained within this report are represented as [place] Standard Time. CONFIDENTIALTY NOTICE: This fax transmission is intended only for the addressee. It contains information that is legally privileged, confidential or otherwise protected from use or disclosure. If you are not the intended recipient, you are strictly prohibited from reviewing, disclosing, copying using or disseminating any of this information or taking any action in reliance on or regarding this information. If you have received this fax in error, please notify us immediately by telephone so that we can arrange for its return to [place]. Phone: [phone], Toll-Free: [phone], Fax: [phone] Page: 2 of 2 Call Id: [id number] [place]. Time Eilene Ghazi Time) Disposition  Final User 03/24/2022 7:35:42 AM Send to Urgent Clarnce Flock 03/24/2022 7:50:07 AM Go to ED Now (or PCP triage) Yes Lucky Cowboy, RN, Levada Dy Final Disposition 03/24/2022 7:50:07 AM Go to ED Now (or PCP triage) Yes Dew, RN, Marin Shutter Disagree/Comply Comply Caller Understands Yes PreDisposition Call another nurse Care Advice Given Per Guideline GO TO ED NOW (OR PCP TRIAGE): * IF NO PCP (PRIMARY CARE PROVIDER) SECOND-LEVEL TRIAGE: You need to be seen within the next hour. Go to the [facility] at _____________ [place] as soon as you can. ANOTHER ADULT SHOULD DRIVE: * It is better and safer if another adult drives instead of you. BRING MEDICINES: * Bring a list of your current medicines when you go to the Emergency Department (ER). * Bring the pill bottles too. This will help the doctor (or NP/PA) to make certain you are taking the right medicines and the right dose. CARE ADVICE given per Dizziness (Adult) guideline. Referrals REFERRED TO PCP OFFICE

## 2022-04-05 ENCOUNTER — Encounter: Payer: Self-pay | Admitting: Family Medicine

## 2022-04-06 ENCOUNTER — Ambulatory Visit: Payer: BC Managed Care – PPO | Admitting: Podiatry

## 2022-04-06 ENCOUNTER — Encounter: Payer: Self-pay | Admitting: Podiatry

## 2022-04-06 DIAGNOSIS — L603 Nail dystrophy: Secondary | ICD-10-CM | POA: Diagnosis not present

## 2022-04-06 DIAGNOSIS — L600 Ingrowing nail: Secondary | ICD-10-CM | POA: Diagnosis not present

## 2022-04-06 MED ORDER — ITRACONAZOLE 100 MG PO CAPS: 100.0000 mg | ORAL_CAPSULE | Freq: Two times a day (BID) | ORAL | 0 refills | Status: DC

## 2022-04-06 MED ORDER — NEOMYCIN-POLYMYXIN-HC 1 % OT SOLN: OTIC | 1 refills | Status: DC

## 2022-04-06 NOTE — Progress Notes (Signed)
She presents today for follow-up of her itraconazole states that the nails are looking much better she had no problems taking the itraconazole.  She is complaining of painful ingrown toenails to the hallux nails bilateral.  Objective: Vital signs are stable alert and oriented x3 there is no erythema edema cellulitis drainage or odor hallux nails demonstrate distal onychocryptosis.  The nail plates do appear to be growing out very nicely and clearly.  Assessment: Long-term therapy with itraconazole 100 mg twice daily.  Ingrown toenails hallux bilateral.  Plan: Discussed etiology pathology conservative versus surgical therapies.  At this point chemical matricectomy's were performed after local anesthetic was administered to the hallux bilateral.  She tolerated procedure well without complications.  Also provided her with both oral and home-going instruction for the care and soaking the toe as well as a prescription for Cortisporin Otic to be applied twice daily after soaking she also received a prescription for the itraconazole 100 mg twice daily for 3 months.  I will follow-up with her in 2 months to check matrixectomy.

## 2022-04-06 NOTE — Patient Instructions (Signed)

## 2022-04-07 NOTE — Assessment & Plan Note (Deleted)
hgba1c acceptable, minimize simple carbs. Increase exercise as tolerated.  

## 2022-04-07 NOTE — Assessment & Plan Note (Deleted)
Supplement and monitor 

## 2022-04-07 NOTE — Assessment & Plan Note (Deleted)
Well controlled, no changes to meds. Encouraged heart healthy diet such as the DASH diet and exercise as tolerated.  °

## 2022-04-07 NOTE — Assessment & Plan Note (Deleted)
Encouraged DASH or MIND diet, decrease po intake and increase exercise as tolerated. Needs 7-8 hours of sleep nightly. Avoid trans fats, eat small, frequent meals every 4-5 hours with lean proteins, complex carbs and healthy fats. Minimize simple carbs, high fat foods and processed foods. Tolerating [employer]

## 2022-04-07 NOTE — Assessment & Plan Note (Deleted)
Encourage heart healthy diet such as MIND or DASH diet, increase exercise, avoid trans fats, simple carbohydrates and processed foods, consider a krill or fish or flaxseed oil cap daily.  °

## 2022-04-07 NOTE — Assessment & Plan Note (Deleted)
Stable on current meds 

## 2022-04-08 ENCOUNTER — Ambulatory Visit: Payer: BC Managed Care – PPO | Admitting: Family Medicine

## 2022-04-11 NOTE — Assessment & Plan Note (Addendum)
Well controlled, no changes to meds. Encouraged heart healthy diet such as the DASH diet and exercise as tolerated.  Consider flu shot. Shingrix is the new shingles shot, 2 shots over 2-6 months, confirm coverage with insurance and document, then can return here for shots with nurse appt or at pharmacy

## 2022-04-11 NOTE — Assessment & Plan Note (Signed)
Encourage heart healthy diet such as MIND or DASH diet, increase exercise, avoid trans fats, simple carbohydrates and processed foods, consider a krill or fish or flaxseed oil cap daily.  °

## 2022-04-11 NOTE — Assessment & Plan Note (Signed)
Encouraged DASH or MIND diet, decrease po intake and increase exercise as tolerated. Needs 7-8 hours of sleep nightly. Avoid trans fats, eat small, frequent meals every 4-5 hours with lean proteins, complex carbs and healthy fats. Minimize simple carbs, high fat foods and processed foods. Semiglutide

## 2022-04-11 NOTE — Progress Notes (Unsigned)
Subjective:    Patient ID: Kim Fox, female    DOB: Nov 19, 1965, 56 y.o.   MRN: [id number]  No chief complaint on file.   HPI Patient is in today for follow up on chronic medical concerns. No recent febrile illness or acute hospitalizations. No complaints of polyuria or polydipsia. Denies CP/palp/SOB/HA/congestion/fevers/GI or GU c/o. Taking meds as prescribed   Past Medical History:  Diagnosis Date   Depression    on meds   Dyslipidemia 10/17/2017   GERD (gastroesophageal reflux disease)    Headache(784.0)    menstral migraine   History of chicken pox 04/10/2015   Hypertension    on meds   Menorrhagia    Multiple renal cysts 08/09/2011   Murmur, cardiac    dx as a child-Echo WNL 2009   Obesity    Polycystic kidney disease    Preventative health care 04/20/2015   Right leg DVT ([place]) 2015   on meds for preventative tx 05/19/2021   Situational anxiety 10/17/2017   on meds   Urinary incontinence 10/28/2015    Past Surgical History:  Procedure Laterality Date   ENDOMETRIAL ABLATION  2016   ENDOMETRIAL BIOPSY  2010   KIDNEY CYST REMOVAL  2002   TONSILLECTOMY  1976   URETERAL STENT PLACEMENT  2002   WISDOM TOOTH EXTRACTION  1984    Family History  Problem Relation Age of Onset   Diabetes Mother        insulin dependent   Hypertension Mother    Migraines Mother    Varicose Veins Mother    Arthritis Mother    Heart disease Father    Diabetes Father    Hypertension Father    Kidney disease Father    Arthritis Father    Migraines Sister    Allergies Sister    Renal Cyst Sister    Cancer Sister        skin cancer   Dysfunctional uterine bleeding Sister    Fibromyalgia Sister    Renal Cyst Sister    Hypertension Sister    Dysfunctional uterine bleeding Sister    Hyperlipidemia Daughter    Cancer Paternal Grandmother        lung   Cancer Paternal Grandfather        bladder, prostate   Heart disease Paternal Grandfather    Colon cancer Neg Hx     Esophageal cancer Neg Hx    Stomach cancer Neg Hx    Rectal cancer Neg Hx    Colon polyps Neg Hx     Social History   Socioeconomic History   Marital status: Divorced    Spouse name: Not on file   Number of children: 2   Years of education: college   Highest education level: Not on file  Occupational History   Occupation: Aeronautical engineer: [employer]  Tobacco Use   Smoking status: Never   Smokeless tobacco: Never  Vaping Use   Vaping Use: Never used  Substance and Sexual Activity   Alcohol use: Not Currently    Alcohol/week: 0.0 - 1.0 standard drinks of alcohol    Comment: occ   Drug use: No   Sexual activity: Not on file  Other Topics Concern   Not on file  Social History Narrative   Not on file   Social Determinants of Health   Financial Resource Strain: Not on file  Food Insecurity: Not on file  Transportation Needs: Not on file  Physical Activity: Not on file  Stress: Not on file  Social Connections: Not on file  Intimate Partner Violence: Not on file    Outpatient Medications Prior to Visit  Medication Sig Dispense Refill   aspirin EC 81 MG tablet Take 81 mg by mouth 2 (two) times daily.     atenolol (TENORMIN) 50 MG tablet TAKE 1 TABLET(50 MG) BY MOUTH DAILY 90 tablet 1   Biotin 5000 MCG CAPS Take 1 capsule by mouth daily.     Calcium Carbonate-Vitamin D (CALCIUM-VITAMIN D) 600-200 MG-UNIT CAPS Take 1 tablet by mouth 2 (two) times daily.     FLUoxetine (PROZAC) 20 MG tablet TAKE 1 TABLET(20 MG) BY MOUTH DAILY 90 tablet 1   folic acid (FOLVITE) 614 MCG tablet Take 400 mcg by mouth daily.     itraconazole (SPORANOX) 100 MG capsule Take 1 capsule (100 mg total) by mouth 2 (two) times daily. 180 capsule 0   L-Lysine 1000 MG TABS Take 1 tablet by mouth daily.     Lidocaine HCl (ASPERCREME LIDOCAINE) 4 % CREA Apply 1 Dose topically 2 (two) times daily as needed. 133 g 11   Multiple Vitamin (MULTIVITAMIN) tablet Take 1 tablet by mouth  daily.     NEOMYCIN-POLYMYXIN-HYDROCORTISONE (CORTISPORIN) 1 % SOLN OTIC solution Apply 1-2 drops to toe BID after soaking 10 mL 1   Omega 3-6-9 Fatty Acids (OMEGA 3-6-9 COMPLEX PO) Take 2 capsules by mouth daily.     Probiotic Product (ACIDOPHILUS/GOAT MILK PO) Take 1 capsule by mouth daily at 6 (six) AM.     Semaglutide-Weight Management 2.4 MG/0.75ML SOAJ Inject 2.4 mg into the skin once a week. 4 mL 3   tiZANidine (ZANAFLEX) 2 MG tablet Take 0.5-2 tablets (1-4 mg total) by mouth 2 (two) times daily as needed for muscle spasms. 30 tablet 1   vitamin B-12 (CYANOCOBALAMIN) 500 MCG tablet Take 500 mcg by mouth daily.     VITAMIN D PO Take 1 capsule by mouth daily at 6 (six) AM.     No facility-administered medications prior to visit.    Allergies  Allergen Reactions   Nitrofurantoin Rash   Macrodantin Rash   Penicillins Rash    Review of Systems  Constitutional:  Negative for fever and malaise/fatigue.  HENT:  Negative for congestion.   Eyes:  Negative for blurred vision.  Respiratory:  Negative for shortness of breath.   Cardiovascular:  Negative for chest pain, palpitations and leg swelling.  Gastrointestinal:  Negative for abdominal pain, blood in stool and nausea.  Genitourinary:  Negative for dysuria and frequency.  Musculoskeletal:  Negative for falls.  Skin:  Negative for rash.  Neurological:  Negative for dizziness, loss of consciousness and headaches.  Endo/Heme/Allergies:  Negative for environmental allergies.  Psychiatric/Behavioral:  Negative for depression. The patient is not nervous/anxious.        Objective:    Physical Exam Constitutional:      General: She is not in acute distress.    Appearance: She is well-developed.  HENT:     Head: Normocephalic and atraumatic.  Eyes:     Conjunctiva/sclera: Conjunctivae normal.  Neck:     Thyroid: No thyromegaly.  Cardiovascular:     Rate and Rhythm: Normal rate and regular rhythm.     Heart sounds: Normal heart  sounds. No murmur heard. Pulmonary:     Effort: Pulmonary effort is normal. No respiratory distress.     Breath sounds: Normal breath sounds.  Abdominal:  General: Bowel sounds are normal. There is no distension.     Palpations: Abdomen is soft. There is no mass.     Tenderness: There is no abdominal tenderness.  Musculoskeletal:     Cervical back: Neck supple.  Lymphadenopathy:     Cervical: No cervical adenopathy.  Skin:    General: Skin is warm and dry.  Neurological:     Mental Status: She is alert and oriented to person, place, and time.  Psychiatric:        Behavior: Behavior normal.     There were no vitals taken for this visit. Wt Readings from Last 3 Encounters:  01/04/22 282 lb 12.8 oz (128.3 kg)  07/27/21 286 lb 9.6 oz (130 kg)  06/02/21 282 lb (127.9 kg)    Diabetic Foot Exam - Simple   No data filed    Lab Results  Component Value Date   WBC 7.3 01/04/2022   HGB 15.4 (H) 01/04/2022   HCT 47.0 (H) 01/04/2022   PLT 213.0 01/04/2022   GLUCOSE 86 01/04/2022   CHOL 157 01/04/2022   TRIG 127.0 01/04/2022   HDL 60.20 01/04/2022   LDLDIRECT 117.0 07/27/2018   LDLCALC 72 01/04/2022   ALT 15 01/04/2022   AST 20 01/04/2022   NA 141 01/04/2022   K 4.7 01/04/2022   CL 105 01/04/2022   CREATININE 0.83 01/04/2022   BUN 16 01/04/2022   CO2 30 01/04/2022   TSH 0.86 01/04/2022   INR 1.03 03/25/2014   HGBA1C 6.0 01/04/2022   MICROALBUR 1.0 10/17/2017    Lab Results  Component Value Date   TSH 0.86 01/04/2022   Lab Results  Component Value Date   WBC 7.3 01/04/2022   HGB 15.4 (H) 01/04/2022   HCT 47.0 (H) 01/04/2022   MCV 87.8 01/04/2022   PLT 213.0 01/04/2022   Lab Results  Component Value Date   NA 141 01/04/2022   K 4.7 01/04/2022   CHLORIDE 108 10/07/2016   CO2 30 01/04/2022   GLUCOSE 86 01/04/2022   BUN 16 01/04/2022   CREATININE 0.83 01/04/2022   BILITOT 0.5 01/04/2022   ALKPHOS 86 01/04/2022   AST 20 01/04/2022   ALT 15 01/04/2022    PROT 7.2 01/04/2022   ALBUMIN 4.0 01/04/2022   CALCIUM 9.2 01/04/2022   ANIONGAP 8 10/07/2016   EGFR 80 (L) 10/07/2016   GFR 79.80 01/04/2022   Lab Results  Component Value Date   CHOL 157 01/04/2022   Lab Results  Component Value Date   HDL 60.20 01/04/2022   Lab Results  Component Value Date   LDLCALC 72 01/04/2022   Lab Results  Component Value Date   TRIG 127.0 01/04/2022   Lab Results  Component Value Date   CHOLHDL 3 01/04/2022   Lab Results  Component Value Date   HGBA1C 6.0 01/04/2022       Assessment & Plan:   Problem List Items Addressed This Visit     Essential hypertension, benign    Well controlled, no changes to meds. Encouraged heart healthy diet such as the DASH diet and exercise as tolerated.  Consider flu shot. Shingrix is the new shingles shot, 2 shots over 2-6 months, confirm coverage with insurance and document, then can return here for shots with nurse appt or at pharmacy      Obesity    Encouraged DASH or MIND diet, decrease po intake and increase exercise as tolerated. Needs 7-8 hours of sleep nightly. Avoid trans fats, eat small,  frequent meals every 4-5 hours with lean proteins, complex carbs and healthy fats. Minimize simple carbs, high fat foods and processed foods. Semiglutide      Dyslipidemia    Encourage heart healthy diet such as MIND or DASH diet, increase exercise, avoid trans fats, simple carbohydrates and processed foods, consider a krill or fish or flaxseed oil cap daily.       Vitamin D deficiency    Supplement and monitor       I am having Kim Fox "Cathy" maintain her L-Lysine, Biotin, multivitamin, Calcium-Vitamin D, folic acid, Probiotic Product (ACIDOPHILUS/GOAT MILK PO), aspirin EC, cyanocobalamin, Omega 3-6-9 Fatty Acids (OMEGA 3-6-9 COMPLEX PO), VITAMIN D PO, Lidocaine HCl, FLUoxetine, tiZANidine, atenolol, Semaglutide-Weight Management, NEOMYCIN-POLYMYXIN-HYDROCORTISONE, and itraconazole.  No  orders of the defined types were placed in this encounter.    Penni Homans, MD

## 2022-04-11 NOTE — Assessment & Plan Note (Signed)
Supplement and monitor 

## 2022-04-12 ENCOUNTER — Ambulatory Visit (INDEPENDENT_AMBULATORY_CARE_PROVIDER_SITE_OTHER): Payer: BC Managed Care – PPO | Admitting: Family Medicine

## 2022-04-12 VITALS — BP 132/82 | HR 67 | Temp 97.0°F | Resp 16 | Ht 67.0 in | Wt 255.0 lb

## 2022-04-12 DIAGNOSIS — H8110 Benign paroxysmal vertigo, unspecified ear: Secondary | ICD-10-CM

## 2022-04-12 DIAGNOSIS — E785 Hyperlipidemia, unspecified: Secondary | ICD-10-CM

## 2022-04-12 DIAGNOSIS — B351 Tinea unguium: Secondary | ICD-10-CM | POA: Insufficient documentation

## 2022-04-12 DIAGNOSIS — E559 Vitamin D deficiency, unspecified: Secondary | ICD-10-CM | POA: Diagnosis not present

## 2022-04-12 DIAGNOSIS — R739 Hyperglycemia, unspecified: Secondary | ICD-10-CM

## 2022-04-12 DIAGNOSIS — F418 Other specified anxiety disorders: Secondary | ICD-10-CM

## 2022-04-12 DIAGNOSIS — I10 Essential (primary) hypertension: Secondary | ICD-10-CM | POA: Diagnosis not present

## 2022-04-12 DIAGNOSIS — M542 Cervicalgia: Secondary | ICD-10-CM

## 2022-04-12 DIAGNOSIS — E6609 Other obesity due to excess calories: Secondary | ICD-10-CM

## 2022-04-12 DIAGNOSIS — Z23 Encounter for immunization: Secondary | ICD-10-CM | POA: Diagnosis not present

## 2022-04-12 DIAGNOSIS — D751 Secondary polycythemia: Secondary | ICD-10-CM

## 2022-04-12 MED ORDER — FAMOTIDINE 40 MG PO TABS: ORAL_TABLET | ORAL | 3 refills | Status: DC

## 2022-04-12 MED ORDER — FLUOXETINE HCL 10 MG PO CAPS: 10.0000 mg | ORAL_CAPSULE | Freq: Every day | ORAL | 1 refills | Status: DC

## 2022-04-12 MED ORDER — FLUOXETINE HCL 10 MG PO TABS: 10.0000 mg | ORAL_TABLET | Freq: Every day | ORAL | 1 refills | Status: DC

## 2022-04-12 NOTE — Patient Instructions (Addendum)
Shingrix is the new shingles shot, 2 shots over 2-6 months, confirm coverage with insurance and document, then can return here for shots with nurse appt or at [facility]astroes[facility] Reflux Disease, Adult When you have gastroesophageal reflux disease (GERD), the foods you eat and your eating habits are very important. Choosing the right foods can help ease your discomfort. Think about working with a food expert (dietitian) to help you make good choices. What are tips for following this plan? Reading food labels Look for foods that are low in saturated fat. Foods that may help with your symptoms include: Foods that have less than 5% of daily value (DV) of fat. Foods that have 0 grams of trans fat. Cooking Do not fry your food. Cook your food by baking, steaming, grilling, or broiling. These are all methods that do not need a lot of fat for cooking. To add flavor, try to use herbs that are low in spice and acidity. Meal planning  Choose healthy foods that are low in fat, such as: Fruits and vegetables. Whole grains. Low-fat dairy products. Lean meats, fish, and poultry. Eat small meals often instead of eating 3 large meals each day. Eat your meals slowly in a place where you are relaxed. Avoid bending over or lying down until 2-3 hours after eating. Limit high-fat foods such as fatty meats or fried foods. Limit your intake of fatty foods, such as oils, butter, and shortening. Avoid the following as told by your doctor: Foods that cause symptoms. These may be different for different people. Keep a food diary to keep track of foods that cause symptoms. Alcohol. Drinking a lot of liquid with meals. Eating meals during the 2-3 hours before bed. Lifestyle Stay at a healthy weight. Ask your doctor what weight is healthy for you. If you need to lose weight, work with your doctor to do so safely. Exercise for at least 30 minutes on 5 or more days each week, or as told by  your doctor. Wear loose-fitting clothes. Do not smoke or use any products that contain nicotine or tobacco. If you need help quitting, ask your doctor. Sleep with the head of your bed higher than your feet. Use a wedge under the mattress or blocks under the bed frame to raise the head of the bed. Chew sugar-free gum after meals. What foods should eat?  Eat a healthy, well-balanced diet of fruits, vegetables, whole grains, low-fat dairy products, lean meats, fish, and poultry. Each person is different. Foods that may cause symptoms in one person may not cause any symptoms in another person. Work with your doctor to find foods that are safe for you. The items listed above may not be a complete list of what you can eat and drink. Contact a food expert for more options. What foods should I avoid? Limiting some of these foods may help in managing the symptoms of GERD. Everyone is different. Talk with a food expert or your doctor to help you find the exact foods to avoid, if any. Fruits Any fruits prepared with added fat. Any fruits that cause symptoms. For some people, this may include citrus fruits, such as oranges, grapefruit, pineapple, and lemons. Vegetables Deep-fried vegetables. [place] fries. Any vegetables prepared with added fat. Any vegetables that cause symptoms. For some people, this may include tomatoes and tomato products, chili peppers, onions and garlic, and horseradish. Grains Pastries or quick breads with added fat. Meats and other proteins High-fat meats, such  as fatty beef or pork, hot dogs, ribs, ham, sausage, salami, and bacon. Fried meat or protein, including fried fish and fried chicken. Nuts and nut butters, in large amounts. Dairy Whole milk and chocolate milk. Sour cream. Cream. Ice cream. Cream cheese. Milkshakes. Fats and oils Butter. Margarine. Shortening. Ghee. Beverages Coffee and tea, with or without caffeine. Carbonated beverages. Sodas. Energy drinks. Fruit juice  made with acidic fruits, such as orange or grapefruit. Tomato juice. Alcoholic drinks. Sweets and desserts Chocolate and cocoa. Donuts. Seasonings and condiments Pepper. Peppermint and spearmint. Added salt. Any condiments, herbs, or seasonings that cause symptoms. For some people, this may include curry, hot sauce, or vinegar-based salad dressings. The items listed above may not be a complete list of what you should not eat and drink. Contact a food expert for more options. Questions to ask your doctor Diet and lifestyle changes are often the first steps that are taken to manage symptoms of GERD. If diet and lifestyle changes do not help, talk with your doctor about taking medicines. Where to find more information [employer] for Gastrointestinal [employer]: aboutgerd.org Summary When you have GERD, food and lifestyle choices are very important in easing your symptoms. Eat small meals often instead of 3 large meals a day. Eat your meals slowly and in a place where you are relaxed. Avoid bending over or lying down until 2-3 hours after eating. Limit high-fat foods such as fatty meats or fried foods. This information is not intended to replace advice given to you by your health care provider. Make sure you discuss any questions you have with your health care provider. Document Revised: 12/17/2019 Document Reviewed: 12/17/2019 Elsevier Patient Education  [place].

## 2022-04-12 NOTE — Assessment & Plan Note (Signed)
Stable on Fluoxeting 10 mg daily, given refill today

## 2022-04-12 NOTE — Assessment & Plan Note (Addendum)
Improving since last visit. Encouraged moist heat and gentle stretching as tolerated. May try Tylenol and prescription meds as directed and report if symptoms worsen or seek immediate care

## 2022-04-12 NOTE — Assessment & Plan Note (Addendum)
Has had three different episodes of several days at a time. Feeling woozey off balance. The only thing she was doing different was taking High dose Zinc she stopped it and it hs been better. She now is worried about her memory. She keeps forgetting things. She acknowledges she has not been drinking enough water. Increase water intake and monitor

## 2022-04-12 NOTE — Assessment & Plan Note (Signed)
Repeat cbc today

## 2022-04-12 NOTE — Assessment & Plan Note (Signed)
Dr Milinda Pointer, podiatry hs her on Iatroconazole 100 mg bid for 6 months, will check LFTs today

## 2022-04-12 NOTE — Assessment & Plan Note (Signed)
hgba1c acceptable, minimize simple carbs. Increase exercise as tolerated.  

## 2022-04-13 ENCOUNTER — Encounter: Payer: Self-pay | Admitting: Family Medicine

## 2022-04-13 DIAGNOSIS — F4323 Adjustment disorder with mixed anxiety and depressed mood: Secondary | ICD-10-CM | POA: Diagnosis not present

## 2022-04-13 LAB — COMPREHENSIVE METABOLIC PANEL
ALT: 14 U/L (ref 0–35)
AST: 20 U/L (ref 0–37)
Albumin: 3.8 g/dL (ref 3.5–5.2)
Alkaline Phosphatase: 73 U/L (ref 39–117)
BUN: 20 mg/dL (ref 6–23)
CO2: 29 mEq/L (ref 19–32)
Calcium: 9.7 mg/dL (ref 8.4–10.5)
Chloride: 102 mEq/L (ref 96–112)
Creatinine, Ser: 0.83 mg/dL (ref 0.40–1.20)
GFR: 79.65 mL/min (ref >60.00)
Glucose, Bld: 85 mg/dL (ref 70–99)
Potassium: 4 mEq/L (ref 3.5–5.1)
Sodium: 138 mEq/L (ref 135–145)
Total Bilirubin: 0.4 mg/dL (ref 0.2–1.2)
Total Protein: 7 g/dL (ref 6.0–8.3)

## 2022-04-13 LAB — CBC
HCT: 46.9 % — ABNORMAL HIGH (ref 36.0–46.0)
Hemoglobin: 15.4 g/dL — ABNORMAL HIGH (ref 12.0–15.0)
MCHC: 32.9 g/dL (ref 30.0–36.0)
MCV: 87.6 fl (ref 78.0–100.0)
Platelets: 234 10*3/uL (ref 150.0–400.0)
RBC: 5.35 Mil/uL — ABNORMAL HIGH (ref 3.87–5.11)
RDW: 14.2 % (ref 11.5–15.5)
WBC: 7.6 10*3/uL (ref 4.0–10.5)

## 2022-04-13 LAB — LIPID PANEL
Cholesterol: 160 mg/dL (ref 0–200)
HDL: 50.7 mg/dL (ref >39.00)
LDL Cholesterol: 84 mg/dL (ref 0–99)
NonHDL: 108.87
Total CHOL/HDL Ratio: 3
Triglycerides: 124 mg/dL (ref 0.0–149.0)
VLDL: 24.8 mg/dL (ref 0.0–40.0)

## 2022-04-13 LAB — HIGH SENSITIVITY CRP: CRP, High Sensitivity: 11.45 mg/L — ABNORMAL HIGH (ref 0.000–5.000)

## 2022-04-13 LAB — SEDIMENTATION RATE: Sed Rate: 19 mm/hr (ref 0–30)

## 2022-04-13 LAB — HEMOGLOBIN A1C: Hgb A1c MFr Bld: 5.7 % (ref 4.6–6.5)

## 2022-04-13 LAB — TSH: TSH: 0.97 u[IU]/mL (ref 0.35–5.50)

## 2022-04-13 LAB — VITAMIN D 25 HYDROXY (VIT D DEFICIENCY, FRACTURES): VITD: 45.26 ng/mL (ref 30.00–100.00)

## 2022-04-14 ENCOUNTER — Telehealth: Payer: Self-pay | Admitting: *Deleted

## 2022-04-14 MED ORDER — FLUOXETINE HCL 10 MG PO TABS: 10.0000 mg | ORAL_TABLET | Freq: Every day | ORAL | 1 refills | Status: DC

## 2022-04-14 NOTE — Telephone Encounter (Signed)
Prior auth started via cover my meds.  Awaiting determination.  Key: [id number]

## 2022-04-14 NOTE — Telephone Encounter (Signed)
[id number];SBBJXF:[id number];Review Type:Prior Auth;Coverage Start Date:03/15/2022;Coverage End Date:04/14/2023;

## 2022-04-14 NOTE — Telephone Encounter (Signed)
Called patient's pharmacy([facility]) and they said that insurance will only cover a  day supply without prior authorization from [facility]([phone])  Approval has been granted for the 90 day supply(180 tablets), case #[id number]  Contact: Alizha B  Valid from 03/15/22-04/14/23,pharmacy and patient have been notified.

## 2022-04-15 MED ORDER — ITRACONAZOLE 100 MG PO CAPS: 100.0000 mg | ORAL_CAPSULE | Freq: Two times a day (BID) | ORAL | 0 refills | Status: DC

## 2022-04-15 NOTE — Addendum Note (Signed)
Addended by: Clovis Riley E on: 04/15/2022 08:09 AM   Modules accepted: Orders

## 2022-04-15 NOTE — Telephone Encounter (Signed)
Patient Rx resent to [facility] with approval information

## 2022-04-22 ENCOUNTER — Encounter: Payer: Self-pay | Admitting: Podiatry

## 2022-04-22 ENCOUNTER — Ambulatory Visit: Payer: BC Managed Care – PPO | Admitting: Podiatry

## 2022-04-22 ENCOUNTER — Encounter: Payer: Self-pay | Admitting: Family Medicine

## 2022-04-22 DIAGNOSIS — L600 Ingrowing nail: Secondary | ICD-10-CM

## 2022-04-22 DIAGNOSIS — Z9889 Other specified postprocedural states: Secondary | ICD-10-CM

## 2022-04-22 NOTE — Progress Notes (Signed)
She presents today for follow-up of her matricectomy's of the hallux bilaterally.  She states that she still soaking daily.  Still a bit tender.  Objective: Vital signs stable alert oriented x3.  There is mild erythema no cellulitis drainage or odor.  Eschars are present.  Mild tenderness on palpation.  Assessment: Matrixectomy hallux bilateral healing well.  Plan: Encouraged her to continue to soak twice daily.  She will follow-up with me in about 2 to 3 months for her itraconazole therapy.

## 2022-05-06 ENCOUNTER — Telehealth: Payer: Self-pay

## 2022-05-06 NOTE — Telephone Encounter (Signed)
PA initiated via Covermymeds; KEY: [id number].   An active PA is already on file with expiration date of 09/28/2022. Please wait to resubmit request within 60 days of that expiration date to obtain a PA renewal.

## 2022-05-10 ENCOUNTER — Encounter: Payer: Self-pay | Admitting: Family Medicine

## 2022-05-10 MED ORDER — SEMAGLUTIDE-WEIGHT MANAGEMENT 2.4 MG/0.75ML ~~LOC~~ SOAJ: 2.4000 mg | SUBCUTANEOUS | 0 refills | Status: DC

## 2022-05-26 DIAGNOSIS — F330 Major depressive disorder, recurrent, mild: Secondary | ICD-10-CM | POA: Diagnosis not present

## 2022-07-06 DIAGNOSIS — F330 Major depressive disorder, recurrent, mild: Secondary | ICD-10-CM | POA: Diagnosis not present

## 2022-07-09 ENCOUNTER — Encounter: Payer: Self-pay | Admitting: Family Medicine

## 2022-07-20 ENCOUNTER — Ambulatory Visit (INDEPENDENT_AMBULATORY_CARE_PROVIDER_SITE_OTHER): Payer: BC Managed Care – PPO | Admitting: Family Medicine

## 2022-07-20 VITALS — BP 136/80 | HR 72 | Temp 97.5°F | Resp 16 | Ht 67.0 in | Wt 231.0 lb

## 2022-07-20 DIAGNOSIS — I10 Essential (primary) hypertension: Secondary | ICD-10-CM

## 2022-07-20 DIAGNOSIS — Z0000 Encounter for general adult medical examination without abnormal findings: Secondary | ICD-10-CM | POA: Diagnosis not present

## 2022-07-20 DIAGNOSIS — R739 Hyperglycemia, unspecified: Secondary | ICD-10-CM

## 2022-07-20 DIAGNOSIS — M21612 Bunion of left foot: Secondary | ICD-10-CM

## 2022-07-20 DIAGNOSIS — M25562 Pain in left knee: Secondary | ICD-10-CM

## 2022-07-20 DIAGNOSIS — E559 Vitamin D deficiency, unspecified: Secondary | ICD-10-CM | POA: Diagnosis not present

## 2022-07-20 DIAGNOSIS — L659 Nonscarring hair loss, unspecified: Secondary | ICD-10-CM

## 2022-07-20 DIAGNOSIS — G8929 Other chronic pain: Secondary | ICD-10-CM

## 2022-07-20 DIAGNOSIS — E785 Hyperlipidemia, unspecified: Secondary | ICD-10-CM | POA: Diagnosis not present

## 2022-07-20 DIAGNOSIS — E6609 Other obesity due to excess calories: Secondary | ICD-10-CM

## 2022-07-20 DIAGNOSIS — M21611 Bunion of right foot: Secondary | ICD-10-CM

## 2022-07-20 DIAGNOSIS — Z1239 Encounter for other screening for malignant neoplasm of breast: Secondary | ICD-10-CM

## 2022-07-20 LAB — CBC WITH DIFFERENTIAL/PLATELET
Basophils Absolute: 0.1 10*3/uL (ref 0.0–0.1)
Basophils Relative: 1.3 % (ref 0.0–3.0)
Eosinophils Absolute: 0.2 10*3/uL (ref 0.0–0.7)
Eosinophils Relative: 2.3 % (ref 0.0–5.0)
HCT: 45.5 % (ref 36.0–46.0)
Hemoglobin: 15.6 g/dL — ABNORMAL HIGH (ref 12.0–15.0)
Lymphocytes Relative: 37.5 % (ref 12.0–46.0)
Lymphs Abs: 2.8 10*3/uL (ref 0.7–4.0)
MCHC: 34.3 g/dL (ref 30.0–36.0)
MCV: 87 fl (ref 78.0–100.0)
Monocytes Absolute: 0.7 10*3/uL (ref 0.1–1.0)
Monocytes Relative: 9.9 % (ref 3.0–12.0)
Neutro Abs: 3.7 10*3/uL (ref 1.4–7.7)
Neutrophils Relative %: 49 % (ref 43.0–77.0)
Platelets: 249 10*3/uL (ref 150.0–400.0)
RBC: 5.23 Mil/uL — ABNORMAL HIGH (ref 3.87–5.11)
RDW: 13.9 % (ref 11.5–15.5)
WBC: 7.5 10*3/uL (ref 4.0–10.5)

## 2022-07-20 LAB — VITAMIN D 25 HYDROXY (VIT D DEFICIENCY, FRACTURES): VITD: 54.15 ng/mL (ref 30.00–100.00)

## 2022-07-20 LAB — COMPREHENSIVE METABOLIC PANEL
ALT: 12 U/L (ref 0–35)
AST: 17 U/L (ref 0–37)
Albumin: 3.9 g/dL (ref 3.5–5.2)
Alkaline Phosphatase: 75 U/L (ref 39–117)
BUN: 15 mg/dL (ref 6–23)
CO2: 30 mEq/L (ref 19–32)
Calcium: 9.6 mg/dL (ref 8.4–10.5)
Chloride: 99 mEq/L (ref 96–112)
Creatinine, Ser: 0.85 mg/dL (ref 0.40–1.20)
GFR: 77.26 mL/min (ref >60.00)
Glucose, Bld: 78 mg/dL (ref 70–99)
Potassium: 4 mEq/L (ref 3.5–5.1)
Sodium: 136 mEq/L (ref 135–145)
Total Bilirubin: 0.6 mg/dL (ref 0.2–1.2)
Total Protein: 7.1 g/dL (ref 6.0–8.3)

## 2022-07-20 LAB — LIPID PANEL
Cholesterol: 178 mg/dL (ref 0–200)
HDL: 47.9 mg/dL (ref >39.00)
LDL Cholesterol: 99 mg/dL (ref 0–99)
NonHDL: 129.63
Total CHOL/HDL Ratio: 4
Triglycerides: 154 mg/dL — ABNORMAL HIGH (ref 0.0–149.0)
VLDL: 30.8 mg/dL (ref 0.0–40.0)

## 2022-07-20 LAB — TSH: TSH: 0.59 u[IU]/mL (ref 0.35–5.50)

## 2022-07-20 LAB — HEMOGLOBIN A1C: Hgb A1c MFr Bld: 5.5 % (ref 4.6–6.5)

## 2022-07-20 NOTE — Assessment & Plan Note (Signed)
hgba1c acceptable, minimize simple carbs. Increase exercise as tolerated.  

## 2022-07-20 NOTE — Assessment & Plan Note (Signed)
Has had trouble with knee for years is being managed by [facility] and she will return to Dr Stann Mainland if worsens. Using topical treatments. She will continue to try and stay active.

## 2022-07-20 NOTE — Assessment & Plan Note (Signed)
Had surgery on right foot but while pain is better there is still discomfort present, she sees Podiatry next week to discuss. Left foot still hurts but not enough to do surgery

## 2022-07-20 NOTE — Patient Instructions (Signed)

## 2022-07-20 NOTE — Assessment & Plan Note (Signed)
Encourage heart healthy diet such as MIND or DASH diet, increase exercise, avoid trans fats, simple carbohydrates and processed foods, consider a krill or fish or flaxseed oil cap daily.  °

## 2022-07-20 NOTE — Assessment & Plan Note (Signed)
Well controlled, no changes to meds. Encouraged heart healthy diet such as the DASH diet and exercise as tolerated.  °

## 2022-07-20 NOTE — Assessment & Plan Note (Signed)
286.8# in February 2023 now 231 on Wegovy

## 2022-07-20 NOTE — Progress Notes (Signed)
Subjective:   By signing my name below, I, Kim Fox, attest that this documentation has been prepared under the direction and in the presence of Kim Lukes, MD., 07/20/2022.   Patient ID: Kim Fox, female    DOB: 03-Sep-1965, 57 y.o.   MRN: [id number]  Chief Complaint  Patient presents with   Annual Exam    Annual Exam    HPI Patient is in today for a comprehensive physical exam and follow up on chronic medical concerns. She denies CP/palpitations/SOB/HA/congestion/fever/ chills/ or GU symptoms.  Family History No changes to the family history.  Right Foot Pain Patient underwent a bunionectomy and fifth metatarsal osteotomy on 07/31/2021. She complains of pain in her right foot and will follow with her podiatrist Dr. Milinda Pointer on 07/27/2022. The pain has improved since the procedure, but there is still discomfort. There is also pain in the left foot but she says this is tolerable and does not require surgery.  Hair Loss Patient is experiencing hair loss, presumably from taking Wegovy, and has been taking Biotin 5000 mcg and Omega-3-6-9 Complex to manage this.  Left Knee Pain Patient reports having left knee pain that has been present for several years. She experiences aching and a sharp pain in the knee when kneeling. She has been using Aspercreme prn to manage the pain. She was seen by Dr. Stann Mainland at [facility] in the past who determined that she has a torn ACL. She has also partaken in physical therapy in the past which has provided temporary relief.  Menopause She is unsure is whether or not she is going through menopause. She has had an ablation and denies having hot flashes.  Weight Loss Patient continues to take Wegovy 2.4 mg/0.75 mL and has lost weight since her last visit. Body mass index is 36.18 kg/m. She has improved her diet but complains that she now feels fatigue and has sleep apnea. However, she is not interested in managing this at this time. She  reports that her bowel habits have changed since starting [facility] and her stools are now firm. Wt Readings from Last 3 Encounters:  07/20/22 231 lb (104.8 kg)  04/12/22 255 lb (115.7 kg)  01/04/22 282 lb 12.8 oz (128.3 kg)   Past Medical History:  Diagnosis Date   Depression    on meds   Dyslipidemia 10/17/2017   GERD (gastroesophageal reflux disease)    Headache(784.0)    menstral migraine   History of chicken pox 04/10/2015   Hypertension    on meds   Menorrhagia    Multiple renal cysts 08/09/2011   Murmur, cardiac    dx as a child-Echo WNL 2009   Obesity    Polycystic kidney disease    Preventative health care 04/20/2015   Right leg DVT ([place]) 2015   on meds for preventative tx 05/19/2021   Situational anxiety 10/17/2017   on meds   Urinary incontinence 10/28/2015    Past Surgical History:  Procedure Laterality Date   ENDOMETRIAL ABLATION  2016   ENDOMETRIAL BIOPSY  2010   KIDNEY CYST REMOVAL  2002   TONSILLECTOMY  1976   URETERAL STENT PLACEMENT  2002   WISDOM TOOTH EXTRACTION  1984    Family History  Problem Relation Age of Onset   Diabetes Mother        insulin dependent   Hypertension Mother    Migraines Mother    Varicose Veins Mother    Arthritis Mother    Heart disease Father  Diabetes Father    Hypertension Father    Kidney disease Father    Arthritis Father    Migraines Sister    Allergies Sister    Renal Cyst Sister    Cancer Sister        skin cancer   Dysfunctional uterine bleeding Sister    Fibromyalgia Sister    Renal Cyst Sister    Hypertension Sister    Dysfunctional uterine bleeding Sister    Hyperlipidemia Daughter    Cancer Paternal Grandmother        lung   Cancer Paternal Grandfather        bladder, prostate   Heart disease Paternal Grandfather    Colon cancer Neg Hx    Esophageal cancer Neg Hx    Stomach cancer Neg Hx    Rectal cancer Neg Hx    Colon polyps Neg Hx     Social History   Socioeconomic History    Marital status: Divorced    Spouse name: Not on file   Number of children: 2   Years of education: college   Highest education level: Not on file  Occupational History   Occupation: Aeronautical engineer: [employer]  Tobacco Use   Smoking status: Never   Smokeless tobacco: Never  Vaping Use   Vaping Use: Never used  Substance and Sexual Activity   Alcohol use: Not Currently    Alcohol/week: 0.0 - 1.0 standard drinks of alcohol    Comment: occ   Drug use: No   Sexual activity: Not on file  Other Topics Concern   Not on file  Social History Narrative   Not on file   Social Determinants of Health   Financial Resource Strain: Not on file  Food Insecurity: Not on file  Transportation Needs: Not on file  Physical Activity: Not on file  Stress: Not on file  Social Connections: Not on file  Intimate Partner Violence: Not on file    Outpatient Medications Prior to Visit  Medication Sig Dispense Refill   aspirin EC 81 MG tablet Take 81 mg by mouth 2 (two) times daily.     atenolol (TENORMIN) 50 MG tablet TAKE 1 TABLET(50 MG) BY MOUTH DAILY 90 tablet 1   Biotin 5000 MCG CAPS Take 1 capsule by mouth daily.     Calcium Carbonate-Vitamin D (CALCIUM-VITAMIN D) 600-200 MG-UNIT CAPS Take 1 tablet by mouth 2 (two) times daily.     famotidine (PEPCID) 40 MG tablet TAKE 1/2 TO 1 TABLET BY MOUTH EVERY NIGHT AT BEDTIME AS NEEDED FOR HEARTBURN OR INDIGESTION 90 tablet 3   FLUoxetine (PROZAC) 10 MG tablet Take 1 tablet (10 mg total) by mouth daily. 90 tablet 1   folic acid (FOLVITE) 233 MCG tablet Take 400 mcg by mouth daily.     itraconazole (SPORANOX) 100 MG capsule Take 1 capsule (100 mg total) by mouth 2 (two) times daily. 180 capsule 0   L-Lysine 1000 MG TABS Take 1 tablet by mouth daily.     Lidocaine HCl (ASPERCREME LIDOCAINE) 4 % CREA Apply 1 Dose topically 2 (two) times daily as needed. 133 g 11   Multiple Vitamin (MULTIVITAMIN) tablet Take 1 tablet by mouth daily.      Omega 3-6-9 Fatty Acids (OMEGA 3-6-9 COMPLEX PO) Take 2 capsules by mouth daily.     Probiotic Product (ACIDOPHILUS/GOAT MILK PO) Take 1 capsule by mouth daily at 6 (six) AM.     Semaglutide-Weight Management 2.4  MG/0.75ML SOAJ Inject 2.4 mg into the skin once a week. 9 mL 0   tiZANidine (ZANAFLEX) 2 MG tablet Take 0.5-2 tablets (1-4 mg total) by mouth 2 (two) times daily as needed for muscle spasms. 30 tablet 1   vitamin B-12 (CYANOCOBALAMIN) 500 MCG tablet Take 500 mcg by mouth daily.     VITAMIN D PO Take 1 capsule by mouth daily at 6 (six) AM.     NEOMYCIN-POLYMYXIN-HYDROCORTISONE (CORTISPORIN) 1 % SOLN OTIC solution Apply 1-2 drops to toe BID after soaking 10 mL 1   No facility-administered medications prior to visit.    Allergies  Allergen Reactions   Nitrofurantoin Rash   Macrodantin Rash   Penicillins Rash    Review of Systems  Constitutional:  Negative for chills and fever.  HENT:  Negative for congestion.   Respiratory:  Negative for shortness of breath.   Cardiovascular:  Negative for chest pain and palpitations.  Gastrointestinal:  Negative for abdominal pain, blood in stool, constipation, diarrhea, nausea and vomiting.  Genitourinary:  Negative for dysuria, frequency, hematuria and urgency.  Musculoskeletal:        (+) left knee pain. (+) bilateral foot pain (worse in right foot).  Skin:           Neurological:  Negative for headaches.      Objective:    Physical Exam Constitutional:      General: She is not in acute distress.    Appearance: Normal appearance. She is normal weight. She is not ill-appearing.  HENT:     Head: Normocephalic and atraumatic.     Right Ear: Tympanic membrane, ear canal and external ear normal.     Left Ear: Tympanic membrane, ear canal and external ear normal.     Nose: Nose normal.     Mouth/Throat:     Mouth: Mucous membranes are moist.     Pharynx: Oropharynx is clear.  Eyes:     General:        Right eye: No discharge.         Left eye: No discharge.     Extraocular Movements: Extraocular movements intact.     Right eye: No nystagmus.     Left eye: No nystagmus.     Pupils: Pupils are equal, round, and reactive to light.  Neck:     Vascular: No carotid bruit.  Cardiovascular:     Rate and Rhythm: Normal rate and regular rhythm.     Pulses: Normal pulses.     Heart sounds: Normal heart sounds. No murmur heard.    No gallop.  Pulmonary:     Effort: Pulmonary effort is normal. No respiratory distress.     Breath sounds: Normal breath sounds. No wheezing or rales.  Abdominal:     General: Bowel sounds are normal.     Palpations: Abdomen is soft.     Tenderness: There is no abdominal tenderness. There is no guarding.     Hernia: No hernia is present.  Musculoskeletal:        General: Normal range of motion.     Cervical back: Normal range of motion.     Right lower leg: No edema.     Left lower leg: No edema.     Comments: Muscle strength 5/5 on upper and lower extremities.   Lymphadenopathy:     Cervical: No cervical adenopathy.  Skin:    General: Skin is warm and dry.  Neurological:     Mental Status: She is  alert and oriented to person, place, and time.     Sensory: Sensation is intact.     Motor: Motor function is intact.     Coordination: Coordination is intact.     Deep Tendon Reflexes:     Reflex Scores:      Patellar reflexes are 3+ on the right side and 3+ on the left side. Psychiatric:        Mood and Affect: Mood normal.        Behavior: Behavior normal.        Judgment: Judgment normal.     BP 136/80 (BP Location: Right Arm, Patient Position: Sitting, Cuff Size: Normal)   Pulse 72   Temp (!) 97.5 F (36.4 C) (Oral)   Resp 16   Ht '5\' 7"'$  (1.702 m)   Wt 231 lb (104.8 kg)   SpO2 96%   BMI 36.18 kg/m  Wt Readings from Last 3 Encounters:  07/20/22 231 lb (104.8 kg)  04/12/22 255 lb (115.7 kg)  01/04/22 282 lb 12.8 oz (128.3 kg)    Diabetic Foot Exam - Simple   No data  filed    Lab Results  Component Value Date   WBC 7.5 07/20/2022   HGB 15.6 (H) 07/20/2022   HCT 45.5 07/20/2022   PLT 249.0 07/20/2022   GLUCOSE 78 07/20/2022   CHOL 178 07/20/2022   TRIG 154.0 (H) 07/20/2022   HDL 47.90 07/20/2022   LDLDIRECT 117.0 07/27/2018   LDLCALC 99 07/20/2022   ALT 12 07/20/2022   AST 17 07/20/2022   NA 136 07/20/2022   K 4.0 07/20/2022   CL 99 07/20/2022   CREATININE 0.85 07/20/2022   BUN 15 07/20/2022   CO2 30 07/20/2022   TSH 0.59 07/20/2022   INR 1.03 03/25/2014   HGBA1C 5.5 07/20/2022   MICROALBUR 1.0 10/17/2017    Lab Results  Component Value Date   TSH 0.59 07/20/2022   Lab Results  Component Value Date   WBC 7.5 07/20/2022   HGB 15.6 (H) 07/20/2022   HCT 45.5 07/20/2022   MCV 87.0 07/20/2022   PLT 249.0 07/20/2022   Lab Results  Component Value Date   NA 136 07/20/2022   K 4.0 07/20/2022   CHLORIDE 108 10/07/2016   CO2 30 07/20/2022   GLUCOSE 78 07/20/2022   BUN 15 07/20/2022   CREATININE 0.85 07/20/2022   BILITOT 0.6 07/20/2022   ALKPHOS 75 07/20/2022   AST 17 07/20/2022   ALT 12 07/20/2022   PROT 7.1 07/20/2022   ALBUMIN 3.9 07/20/2022   CALCIUM 9.6 07/20/2022   ANIONGAP 8 10/07/2016   EGFR 80 (L) 10/07/2016   GFR 77.26 07/20/2022   Lab Results  Component Value Date   CHOL 178 07/20/2022   Lab Results  Component Value Date   HDL 47.90 07/20/2022   Lab Results  Component Value Date   LDLCALC 99 07/20/2022   Lab Results  Component Value Date   TRIG 154.0 (H) 07/20/2022   Lab Results  Component Value Date   CHOLHDL 4 07/20/2022   Lab Results  Component Value Date   HGBA1C 5.5 07/20/2022      Assessment & Plan:  Colonoscopy: Last completed on 06/02/2021. Repeat in 7 years. - Hemorrhoids found on digital rectal exam. - The examined portion of the ileum was normal. - One 4 mm polyp in the ascending colon, removed with a cold snare. Resected and retrieved.  - Diverticulosis in the recto-sigmoid  colon and in the sigmoid  colon. - Normal mucosa in the entire  examined colon otherwise. - Non-bleeding non-thrombosed external and internal hemorrhoids.  Mammogram: Last completed on 06/08/2021 with no mammographic evidence of malignancy. Repeat in 1-2 years.  Pap Smear: Last completed on 03/17/2021 with normal results. Repeat in 3-5 years.  Advanced Directives: Encouraged patient to complete advanced care planning documents.  Bowel Habits: Recommended fibres and fluids.  Healthy Lifestyle: Encouraged exercise, heart healthy diet, and hydration.  Immunizations: Reviewed patient's immunization history and encouraged COVID-19 immunization. Immunization History  Administered Date(s) Administered   Influenza Split 03/18/2011   Influenza,inj,Quad PF,6+ Mos 04/04/2013, 04/18/2014, 04/10/2015, 04/20/2019, 04/08/2021, 04/12/2022   Influenza-Unspecified 04/02/2017, 04/12/2018, 05/07/2020   Moderna Sars-Covid-2 Vaccination 10/01/2019, 11/06/2019   Tdap 08/09/2011, 01/04/2022   Zoster, Unspecified 07/02/2022   Problem List Items Addressed This Visit     Bilateral bunions    Had surgery on right foot but while pain is better there is still discomfort present, she sees Podiatry next week to discuss. Left foot still hurts but not enough to do surgery      Dyslipidemia    Encourage heart healthy diet such as MIND or DASH diet, increase exercise, avoid trans fats, simple carbohydrates and processed foods, consider a krill or fish or flaxseed oil cap daily.        Relevant Orders   Lipid panel (Completed)   Essential hypertension, benign    Well controlled, no changes to meds. Encouraged heart healthy diet such as the DASH diet and exercise as tolerated.        Relevant Orders   CBC with Differential/Platelet (Completed)   Comprehensive metabolic panel (Completed)   TSH (Completed)   Hair loss    Likely multifactorial and related to menopause, weight, nutrition and stress. Take MVI,  fatty acids, consider nutrofol and hydrate well.      Hyperglycemia    hgba1c acceptable, minimize simple carbs. Increase exercise as tolerated.       Relevant Orders   Hemoglobin A1c (Completed)   Knee pain, left    Has had trouble with knee for years is being managed by [facility] and she will return to Dr Stann Mainland if worsens. Using topical treatments. She will continue to try and stay active.       Obesity - Primary    286.8# in February 2023 now 231 on Wegovy      Preventative health care    Patient encouraged to maintain heart healthy diet, regular exercise, adequate sleep. Consider daily probiotics. Take medications as prescribed. Labs reviewed with patient. Pap performed today. Referred for colonoscopy discussed immunizations but she declines for today. MGM ordered      Vitamin D deficiency    Supplement and monitor       Relevant Orders   VITAMIN D 25 Hydroxy (Vit-D Deficiency, Fractures) (Completed)   Other Visit Diagnoses     Encounter for screening for malignant neoplasm of breast, unspecified screening modality       Relevant Orders   MM 3D SCREEN BREAST BILATERAL      No orders of the defined types were placed in this encounter.  I, Penni Homans, MD, personally preformed the services described in this documentation.  All medical record entries made by the scribe were at my direction and in my presence.  I have reviewed the chart and discharge instructions (if applicable) and agree that the record reflects my personal performance and is accurate and complete. 07/20/2022  I,Mohammed Iqbal,acting as a scribe for Penni Homans,  MD.,have documented all relevant documentation on the behalf of Penni Homans, MD,as directed by  Penni Homans, MD while in the presence of Penni Homans, MD.  Penni Homans, MD

## 2022-07-20 NOTE — Assessment & Plan Note (Signed)
Patient encouraged to maintain heart healthy diet, regular exercise, adequate sleep. Consider daily probiotics. Take medications as prescribed. Labs reviewed with patient. Pap performed today. Referred for colonoscopy discussed immunizations but she declines for today. MGM ordered

## 2022-07-25 DIAGNOSIS — L659 Nonscarring hair loss, unspecified: Secondary | ICD-10-CM | POA: Insufficient documentation

## 2022-07-25 NOTE — Assessment & Plan Note (Signed)
Supplement and monitor 

## 2022-07-25 NOTE — Assessment & Plan Note (Signed)
Likely multifactorial and related to menopause, weight, nutrition and stress. Take MVI, fatty acids, consider nutrofol and hydrate well.

## 2022-07-27 ENCOUNTER — Ambulatory Visit (INDEPENDENT_AMBULATORY_CARE_PROVIDER_SITE_OTHER): Payer: BC Managed Care – PPO

## 2022-07-27 ENCOUNTER — Ambulatory Visit: Payer: BC Managed Care – PPO | Admitting: Podiatry

## 2022-07-27 ENCOUNTER — Encounter: Payer: Self-pay | Admitting: *Deleted

## 2022-07-27 DIAGNOSIS — M778 Other enthesopathies, not elsewhere classified: Secondary | ICD-10-CM

## 2022-07-27 DIAGNOSIS — M19071 Primary osteoarthritis, right ankle and foot: Secondary | ICD-10-CM | POA: Diagnosis not present

## 2022-07-27 DIAGNOSIS — L603 Nail dystrophy: Secondary | ICD-10-CM

## 2022-07-27 MED ORDER — ITRACONAZOLE 100 MG PO CAPS: 100.0000 mg | ORAL_CAPSULE | Freq: Two times a day (BID) | ORAL | 0 refills | Status: DC

## 2022-07-27 NOTE — Progress Notes (Signed)
  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 female presents with the above complaint. History confirmed with patient.  She is doing well the nails are improving.  Needs a refill of her itraconazole.  Her main complaint is pain around the first and fifth MTPJ's from sites of prior surgery for tailor's bunionectomy as well as bunion correction.  Objective:  Physical Exam: warm, good capillary refill, no trophic changes or ulcerative lesions, normal DP and PT pulses, normal sensory exam, and onychomycosis improving, there is slight tenderness around the medial first MPJ and lateral second MPJ, some pain with range of motion.   Radiographs: Multiple views x-ray of the right foot: Well-healed osteotomies with good correction, some loose body formation periarticular of the first and fifth MPJ Assessment:   1. Capsulitis of right foot   2. Nail dystrophy      Plan:  Patient was evaluated and treated and all questions answered.  Rx for itraconazole refill sent to pharmacy.  She will follow-up with Dr. 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  She says overall things are improving just gradually and slowly.  I recommended continued range of motion and topical anti-inflammatories.  I discussed the option of a corticosteroid injection of this today but she declined.  We discussed the possibility of the arthritis being progressive and continuing to worsen over time.  We discussed supportive shoe gear as well that would not provide pressure on the areas.   Return in about 3 months  (around 10/25/2022) for f/u nails and bunion surgery.

## 2022-08-03 ENCOUNTER — Telehealth (HOSPITAL_BASED_OUTPATIENT_CLINIC_OR_DEPARTMENT_OTHER): Payer: Self-pay

## 2022-08-05 ENCOUNTER — Telehealth: Payer: Self-pay | Admitting: Family Medicine

## 2022-08-05 MED ORDER — SEMAGLUTIDE-WEIGHT MANAGEMENT 2.4 MG/0.75ML ~~LOC~~ SOAJ: 2.4000 mg | SUBCUTANEOUS | 0 refills | Status: DC

## 2022-08-05 NOTE — Telephone Encounter (Signed)
PA approved.    [id number];Review Type:Prior Auth;Coverage Start Date:07/06/2022;Coverage End Date:08/05/2023;. Authorization Expiration Date: August 05, 2023.

## 2022-08-05 NOTE — Telephone Encounter (Signed)
PA initiated via Covermymeds; KEY: [id number]. Awaiting determination.

## 2022-08-17 DIAGNOSIS — F330 Major depressive disorder, recurrent, mild: Secondary | ICD-10-CM | POA: Diagnosis not present

## 2022-09-09 ENCOUNTER — Ambulatory Visit (HOSPITAL_BASED_OUTPATIENT_CLINIC_OR_DEPARTMENT_OTHER)
Admission: RE | Admit: 2022-09-09 | Discharge: 2022-09-09 | Disposition: A | Discharge status: Home / Self Care | Payer: BC Managed Care – PPO | Source: Ambulatory Visit | Attending: Family Medicine | Admitting: Family Medicine

## 2022-09-09 ENCOUNTER — Encounter (HOSPITAL_BASED_OUTPATIENT_CLINIC_OR_DEPARTMENT_OTHER): Payer: Self-pay

## 2022-09-09 DIAGNOSIS — Z1231 Encounter for screening mammogram for malignant neoplasm of breast: Secondary | ICD-10-CM | POA: Insufficient documentation

## 2022-09-09 DIAGNOSIS — Z1239 Encounter for other screening for malignant neoplasm of breast: Secondary | ICD-10-CM

## 2022-10-06 DIAGNOSIS — F330 Major depressive disorder, recurrent, mild: Secondary | ICD-10-CM | POA: Diagnosis not present

## 2022-10-26 ENCOUNTER — Ambulatory Visit: Payer: BC Managed Care – PPO | Admitting: Podiatry

## 2022-10-28 ENCOUNTER — Other Ambulatory Visit: Payer: Self-pay | Admitting: Family Medicine

## 2022-10-29 ENCOUNTER — Other Ambulatory Visit: Payer: Self-pay | Admitting: Family Medicine

## 2022-11-04 ENCOUNTER — Encounter: Payer: Self-pay | Admitting: Podiatry

## 2022-11-04 ENCOUNTER — Ambulatory Visit: Payer: BC Managed Care – PPO | Admitting: Podiatry

## 2022-11-04 DIAGNOSIS — M2011 Hallux valgus (acquired), right foot: Secondary | ICD-10-CM

## 2022-11-04 DIAGNOSIS — L603 Nail dystrophy: Secondary | ICD-10-CM | POA: Diagnosis not present

## 2022-11-04 NOTE — Progress Notes (Signed)
She presents today for follow-up of her onychomycosis and itraconazole use states that she is complete.  Comes this point.  States that she still has pain in the first metatarsal and fifth metatarsal phalangeal joints were osteotomies were performed over a year ago now.  Objective: Vital signs stable alert and oriented x 3.  There is no erythema edema salines drainage [place]guera great range of motion at the fifth and first metatarsophalangeal joints.  No palpable internal fixation.  Toenails appear to be healing very nicely they are normal and there appearance and thickness.  Radiographs taken today demonstrate osseously mature individual internal fixation intact and in good position she does have some joint space narrowing of the first metatarsophalangeal joint.  Fifth metatarsal does demonstrate 2 screws to the fifth met head that are intact and in good position.  Assessment: Mild osteoarthritic changes first metatarsophalangeal joint cannot entirely rule out painful internal fixation.  Well-healing onychomycosis.  Plan: Discussed etiology pathology and surgical therapies at this point we will follow-up with her in 3 months to see if she has any regression in her toenails of the continue to grow out nicely.  Should they be progressing we will consider starting her back on her itraconazole.  We did discuss possible need for removing internal fixation as well as topical anti-inflammatories.  She cannot take oral anti-inflammatories because of her kidneys.  Follow-up with her as needed.

## 2022-11-24 DIAGNOSIS — F330 Major depressive disorder, recurrent, mild: Secondary | ICD-10-CM | POA: Diagnosis not present

## 2022-11-25 ENCOUNTER — Encounter: Payer: Self-pay | Admitting: Family Medicine

## 2023-01-04 DIAGNOSIS — F330 Major depressive disorder, recurrent, mild: Secondary | ICD-10-CM | POA: Diagnosis not present

## 2023-01-17 NOTE — Assessment & Plan Note (Signed)
Avoid offending foods, start probiotics. Do not eat large meals in late evening and consider raising head of bed.  

## 2023-01-17 NOTE — Assessment & Plan Note (Signed)
Well controlled, no changes to meds. Encouraged heart healthy diet such as the DASH diet and exercise as tolerated.  °

## 2023-01-17 NOTE — Assessment & Plan Note (Signed)
Encourage heart healthy diet such as MIND or DASH diet, increase exercise, avoid trans fats, simple carbohydrates and processed foods, consider a krill or fish or flaxseed oil cap daily.  °

## 2023-01-17 NOTE — Progress Notes (Unsigned)
Subjective:    Patient ID: Kim Fox, female    DOB: 1966/01/05, 57 y.o.   MRN: [id number]  No chief complaint on file.   HPI Discussed the use of AI scribe software for clinical note transcription with the patient, who gave verbal consent to proceed.  History of Present Illness   Patient is a 57 yo female in for follow up on chronic medical concerns. No recent febrile illness or hospitalizations. Denies CP/palp/SOB/HA/congestion/fevers/GI or GU c/o. Taking meds as prescribed            Past Medical History:  Diagnosis Date  . Depression    on meds  . Dyslipidemia 10/17/2017  . GERD (gastroesophageal reflux disease)   . Headache(784.0)    menstral migraine  . History of chicken pox 04/10/2015  . Hypertension    on meds  . Menorrhagia   . Multiple renal cysts 08/09/2011  . Murmur, cardiac    dx as a child-Echo WNL 2009  . Obesity   . Polycystic kidney disease   . Preventative health care 04/20/2015  . Right leg DVT (HCC) 2015   on meds for preventative tx 05/19/2021  . Situational anxiety 10/17/2017   on meds  . Urinary incontinence 10/28/2015    Past Surgical History:  Procedure Laterality Date  . ENDOMETRIAL ABLATION  2016  . ENDOMETRIAL BIOPSY  2010  . KIDNEY CYST REMOVAL  2002  . TONSILLECTOMY  1976  . URETERAL STENT PLACEMENT  2002  . WISDOM TOOTH EXTRACTION  1984    Family History  Problem Relation Age of Onset  . Diabetes Mother        insulin dependent  . Hypertension Mother   . Migraines Mother   . Varicose Veins Mother   . Arthritis Mother   . Heart disease Father   . Diabetes Father   . Hypertension Father   . Kidney disease Father   . Arthritis Father   . Migraines Sister   . Allergies Sister   . Renal Cyst Sister   . Cancer Sister        skin cancer  . Dysfunctional uterine bleeding Sister   . Fibromyalgia Sister   . Renal Cyst Sister   . Hypertension Sister   . Dysfunctional uterine bleeding Sister   . Hyperlipidemia  Daughter   . Cancer Paternal Grandmother        lung  . Cancer Paternal Grandfather        bladder, prostate  . Heart disease Paternal Grandfather   . Colon cancer Neg Hx   . Esophageal cancer Neg Hx   . Stomach cancer Neg Hx   . Rectal cancer Neg Hx   . Colon polyps Neg Hx     Social History   Socioeconomic History  . Marital status: Divorced    Spouse name: Not on file  . Number of children: 2  . Years of education: college  . Highest education level: Not on file  Occupational History  . Occupation: Technical brewer: [employer]  Tobacco Use  . Smoking status: Never  . Smokeless tobacco: Never  Vaping Use  . Vaping status: Never Used  Substance and Sexual Activity  . Alcohol use: Not Currently    Alcohol/week: 0.0 - 1.0 standard drinks of alcohol    Comment: occ  . Drug use: No  . Sexual activity: Not on file  Other Topics Concern  . Not on file  Social History Narrative  .  Not on file   Social Determinants of Health   Financial Resource Strain: Not on file  Food Insecurity: Not on file  Transportation Needs: Not on file  Physical Activity: Not on file  Stress: Not on file  Social Connections: Not on file  Intimate Partner Violence: Not on file    Outpatient Medications Prior to Visit  Medication Sig Dispense Refill  . aspirin EC 81 MG tablet Take 81 mg by mouth 2 (two) times daily.    Marland Kitchen atenolol (TENORMIN) 50 MG tablet TAKE 1 TABLET(50 MG) BY MOUTH DAILY 90 tablet 1  . Biotin 5000 MCG CAPS Take 1 capsule by mouth daily.    . Calcium Carbonate-Vitamin D (CALCIUM-VITAMIN D) 600-200 MG-UNIT CAPS Take 1 tablet by mouth 2 (two) times daily.    . famotidine (PEPCID) 40 MG tablet TAKE 1/2 TO 1 TABLET BY MOUTH EVERY NIGHT AT BEDTIME AS NEEDED FOR HEARTBURN OR INDIGESTION 90 tablet 3  . FLUoxetine (PROZAC) 10 MG tablet Take 1 tablet (10 mg total) by mouth daily. 90 tablet 1  . folic acid (FOLVITE) 800 MCG tablet Take 400 mcg by mouth daily.     Marland Kitchen itraconazole (SPORANOX) 100 MG capsule Take 1 capsule (100 mg total) by mouth 2 (two) times daily. 180 capsule 0  . L-Lysine 1000 MG TABS Take 1 tablet by mouth daily.    . Lidocaine HCl (ASPERCREME LIDOCAINE) 4 % CREA Apply 1 Dose topically 2 (two) times daily as needed. 133 g 11  . Multiple Vitamin (MULTIVITAMIN) tablet Take 1 tablet by mouth daily.    . Omega 3-6-9 Fatty Acids (OMEGA 3-6-9 COMPLEX PO) Take 2 capsules by mouth daily.    . Probiotic Product (ACIDOPHILUS/GOAT MILK PO) Take 1 capsule by mouth daily at 6 (six) AM.    . tiZANidine (ZANAFLEX) 2 MG tablet Take 0.5-2 tablets (1-4 mg total) by mouth 2 (two) times daily as needed for muscle spasms. 30 tablet 1  . vitamin B-12 (CYANOCOBALAMIN) 500 MCG tablet Take 500 mcg by mouth daily.    Marland Kitchen VITAMIN D PO Take 1 capsule by mouth daily at 6 (six) AM.    . WEGOVY 2.4 MG/0.75ML SOAJ ADMINISTER 2.4 MG UNDER THE SKIN 1 TIME A WEEK 9 mL 0   No facility-administered medications prior to visit.    Allergies  Allergen Reactions  . Nitrofurantoin Rash  . Macrodantin Rash  . Penicillins Rash    Review of Systems  Constitutional:  Negative for fever and malaise/fatigue.  HENT:  Negative for congestion.   Eyes:  Negative for blurred vision.  Respiratory:  Negative for shortness of breath.   Cardiovascular:  Negative for chest pain, palpitations and leg swelling.  Gastrointestinal:  Negative for abdominal pain, blood in stool and nausea.  Genitourinary:  Negative for dysuria and frequency.  Musculoskeletal:  Negative for falls.  Skin:  Negative for rash.  Neurological:  Negative for dizziness, loss of consciousness and headaches.  Endo/Heme/Allergies:  Negative for environmental allergies.  Psychiatric/Behavioral:  Negative for depression. The patient is not nervous/anxious.        Objective:    Physical Exam Constitutional:      General: She is not in acute distress.    Appearance: Normal appearance. She is well-developed.  She is not toxic-appearing.  HENT:     Head: Normocephalic and atraumatic.     Right Ear: External ear normal.     Left Ear: External ear normal.     Nose: Nose normal.  Eyes:  General:        Right eye: No discharge.        Left eye: No discharge.     Conjunctiva/sclera: Conjunctivae normal.  Neck:     Thyroid: No thyromegaly.  Cardiovascular:     Rate and Rhythm: Normal rate and regular rhythm.     Heart sounds: Normal heart sounds. No murmur heard. Pulmonary:     Effort: Pulmonary effort is normal. No respiratory distress.     Breath sounds: Normal breath sounds.  Abdominal:     General: Bowel sounds are normal.     Palpations: Abdomen is soft.     Tenderness: There is no abdominal tenderness. There is no guarding.  Musculoskeletal:        General: Normal range of motion.     Cervical back: Neck supple.  Lymphadenopathy:     Cervical: No cervical adenopathy.  Skin:    General: Skin is warm and dry.  Neurological:     Mental Status: She is alert and oriented to person, place, and time.  Psychiatric:        Mood and Affect: Mood normal.        Behavior: Behavior normal.        Thought Content: Thought content normal.        Judgment: Judgment normal.   There were no vitals taken for this visit. Wt Readings from Last 3 Encounters:  07/20/22 231 lb (104.8 kg)  04/12/22 255 lb (115.7 kg)  01/04/22 282 lb 12.8 oz (128.3 kg)    Diabetic Foot Exam - Simple   No data filed    Lab Results  Component Value Date   WBC 7.5 07/20/2022   HGB 15.6 (H) 07/20/2022   HCT 45.5 07/20/2022   PLT 249.0 07/20/2022   GLUCOSE 78 07/20/2022   CHOL 178 07/20/2022   TRIG 154.0 (H) 07/20/2022   HDL 47.90 07/20/2022   LDLDIRECT 117.0 07/27/2018   LDLCALC 99 07/20/2022   ALT 12 07/20/2022   AST 17 07/20/2022   NA 136 07/20/2022   K 4.0 07/20/2022   CL 99 07/20/2022   CREATININE 0.85 07/20/2022   BUN 15 07/20/2022   CO2 30 07/20/2022   TSH 0.59 07/20/2022   INR 1.03  03/25/2014   HGBA1C 5.5 07/20/2022   MICROALBUR 1.0 10/17/2017    Lab Results  Component Value Date   TSH 0.59 07/20/2022   Lab Results  Component Value Date   WBC 7.5 07/20/2022   HGB 15.6 (H) 07/20/2022   HCT 45.5 07/20/2022   MCV 87.0 07/20/2022   PLT 249.0 07/20/2022   Lab Results  Component Value Date   NA 136 07/20/2022   K 4.0 07/20/2022   CHLORIDE 108 10/07/2016   CO2 30 07/20/2022   GLUCOSE 78 07/20/2022   BUN 15 07/20/2022   CREATININE 0.85 07/20/2022   BILITOT 0.6 07/20/2022   ALKPHOS 75 07/20/2022   AST 17 07/20/2022   ALT 12 07/20/2022   PROT 7.1 07/20/2022   ALBUMIN 3.9 07/20/2022   CALCIUM 9.6 07/20/2022   ANIONGAP 8 10/07/2016   EGFR 80 (L) 10/07/2016   GFR 77.26 07/20/2022   Lab Results  Component Value Date   CHOL 178 07/20/2022   Lab Results  Component Value Date   HDL 47.90 07/20/2022   Lab Results  Component Value Date   LDLCALC 99 07/20/2022   Lab Results  Component Value Date   TRIG 154.0 (H) 07/20/2022   Lab Results  Component Value  Date   CHOLHDL 4 07/20/2022   Lab Results  Component Value Date   HGBA1C 5.5 07/20/2022       Assessment & Plan:  Dyslipidemia Assessment & Plan: Encourage heart healthy diet such as MIND or DASH diet, increase exercise, avoid trans fats, simple carbohydrates and processed foods, consider a krill or fish or flaxseed oil cap daily.     Essential hypertension, benign Assessment & Plan: Well controlled, no changes to meds. Encouraged heart healthy diet such as the DASH diet and exercise as tolerated.     Gastroesophageal reflux disease, unspecified whether esophagitis present Assessment & Plan: Avoid offending foods, start probiotics. Do not eat large meals in late evening and consider raising head of bed.       Assessment and Plan              Danise Edge, MD

## 2023-01-18 ENCOUNTER — Encounter: Payer: Self-pay | Admitting: Family Medicine

## 2023-01-18 ENCOUNTER — Ambulatory Visit: Payer: BC Managed Care – PPO | Admitting: Family Medicine

## 2023-01-18 VITALS — BP 122/82 | HR 68 | Temp 97.5°F | Resp 16 | Ht 67.0 in | Wt 202.8 lb

## 2023-01-18 DIAGNOSIS — E785 Hyperlipidemia, unspecified: Secondary | ICD-10-CM | POA: Diagnosis not present

## 2023-01-18 DIAGNOSIS — K219 Gastro-esophageal reflux disease without esophagitis: Secondary | ICD-10-CM | POA: Diagnosis not present

## 2023-01-18 DIAGNOSIS — R739 Hyperglycemia, unspecified: Secondary | ICD-10-CM

## 2023-01-18 DIAGNOSIS — I10 Essential (primary) hypertension: Secondary | ICD-10-CM | POA: Diagnosis not present

## 2023-01-18 DIAGNOSIS — K5900 Constipation, unspecified: Secondary | ICD-10-CM

## 2023-01-18 DIAGNOSIS — L304 Erythema intertrigo: Secondary | ICD-10-CM

## 2023-01-18 DIAGNOSIS — M67432 Ganglion, left wrist: Secondary | ICD-10-CM

## 2023-01-18 LAB — LIPID PANEL
Cholesterol: 171 mg/dL (ref 0–200)
HDL: 49.8 mg/dL (ref >39.00)
LDL Cholesterol: 108 mg/dL — ABNORMAL HIGH (ref 0–99)
NonHDL: 121.06
Total CHOL/HDL Ratio: 3
Triglycerides: 64 mg/dL (ref 0.0–149.0)
VLDL: 12.8 mg/dL (ref 0.0–40.0)

## 2023-01-18 LAB — COMPREHENSIVE METABOLIC PANEL
ALT: 14 U/L (ref 0–35)
AST: 21 U/L (ref 0–37)
Albumin: 3.9 g/dL (ref 3.5–5.2)
Alkaline Phosphatase: 65 U/L (ref 39–117)
BUN: 25 mg/dL — ABNORMAL HIGH (ref 6–23)
CO2: 29 mEq/L (ref 19–32)
Calcium: 9.5 mg/dL (ref 8.4–10.5)
Chloride: 106 mEq/L (ref 96–112)
Creatinine, Ser: 0.91 mg/dL (ref 0.40–1.20)
GFR: 70.94 mL/min (ref >60.00)
Glucose, Bld: 90 mg/dL (ref 70–99)
Potassium: 3.9 mEq/L (ref 3.5–5.1)
Sodium: 143 mEq/L (ref 135–145)
Total Bilirubin: 0.7 mg/dL (ref 0.2–1.2)
Total Protein: 6.9 g/dL (ref 6.0–8.3)

## 2023-01-18 LAB — CBC WITH DIFFERENTIAL/PLATELET
Basophils Absolute: 0.1 10*3/uL (ref 0.0–0.1)
Basophils Relative: 1.8 % (ref 0.0–3.0)
Eosinophils Absolute: 0.1 10*3/uL (ref 0.0–0.7)
Eosinophils Relative: 1.1 % (ref 0.0–5.0)
HCT: 45.3 % (ref 36.0–46.0)
Hemoglobin: 14.7 g/dL (ref 12.0–15.0)
Lymphocytes Relative: 36.3 % (ref 12.0–46.0)
Lymphs Abs: 2.1 10*3/uL (ref 0.7–4.0)
MCHC: 32.5 g/dL (ref 30.0–36.0)
MCV: 89.3 fl (ref 78.0–100.0)
Monocytes Absolute: 0.6 10*3/uL (ref 0.1–1.0)
Monocytes Relative: 9.7 % (ref 3.0–12.0)
Neutro Abs: 3 10*3/uL (ref 1.4–7.7)
Neutrophils Relative %: 51.1 % (ref 43.0–77.0)
Platelets: 233 10*3/uL (ref 150.0–400.0)
RBC: 5.07 Mil/uL (ref 3.87–5.11)
RDW: 13.8 % (ref 11.5–15.5)
WBC: 5.9 10*3/uL (ref 4.0–10.5)

## 2023-01-18 LAB — HEMOGLOBIN A1C: Hgb A1c MFr Bld: 5.6 % (ref 4.6–6.5)

## 2023-01-18 LAB — TSH: TSH: 0.7 u[IU]/mL (ref 0.35–5.50)

## 2023-01-18 MED ORDER — DICLOFENAC SODIUM 1 % EX GEL: 4.0000 g | Freq: Four times a day (QID) | CUTANEOUS | Status: AC | PRN

## 2023-01-18 MED ORDER — WEGOVY 2.4 MG/0.75ML ~~LOC~~ SOAJ: 2.4000 mg | SUBCUTANEOUS | 1 refills | Status: DC

## 2023-01-18 MED ORDER — CLINDAMYCIN PHOSPHATE 1 % EX LOTN: TOPICAL_LOTION | Freq: Two times a day (BID) | CUTANEOUS | 1 refills | Status: DC

## 2023-01-18 MED ORDER — NYSTATIN 100000 UNIT/GM EX CREA: 1.0000 | TOPICAL_CREAM | Freq: Two times a day (BID) | CUTANEOUS | 1 refills | Status: DC

## 2023-01-18 NOTE — Patient Instructions (Addendum)
Encouraged increased hydration and fiber in diet. Daily probiotics. If bowels not moving can use MOM 2 tbls po in 4 oz of warm prune juice by mouth every 2-3 days. If no results then repeat in 4 hours with  Dulcolax suppository pr, may repeat again in 4 more hours as needed. Seek care if symptoms worsen. Consider daily Miralax and/or Dulcolax if symptoms persist.     Ganglion Cyst  A ganglion cyst is a non-cancerous, fluid-filled lump of tissue that occurs near a joint, tendon, or ligament. The cyst grows out of a joint or the lining of a tendon or ligament. Ganglion cysts most often develop in the hand or wrist, but they can also develop in the shoulder, elbow, hip, knee, ankle, or foot. Ganglion cysts are ball-shaped or egg-shaped. Their size can range from the size of a pea to larger than a grape. Increased activity may cause the cyst to get bigger because more fluid starts to build up. What are the causes? The exact cause of this condition is not known, but it may be related to: Inflammation or irritation around the joint. An injury or tear in the layers of tissue around the joint (joint capsule). Repetitive movements or overuse. History of acute or repeated injury. What increases the risk? You are more likely to develop this condition if: You are a female. You are 79-72 years old. What are the signs or symptoms? The main symptom of this condition is a lump. It most often appears on the hand or wrist. In many cases, there are no other symptoms, but a cyst can sometimes cause: Tingling. Pain or tenderness. Numbness. Weakness or loss of strength in the affected joint. Decreased range of motion in the affected area of the body. How is this diagnosed? Ganglion cysts are usually diagnosed based on a physical exam. Your health care provider will feel the lump and may shine a light next to it. If it is a ganglion cyst, the light will likely shine through it. Your health care provider may order  an X-ray, ultrasound, MRI, or CT scan to rule out other conditions. How is this treated? Ganglion cysts often go away on their own without treatment. If you have pain or other symptoms, treatment may be needed. Treatment is also needed if the ganglion cyst limits your movement or if it gets infected. Treatment may include: Wearing a brace or splint on your wrist or finger. Taking anti-inflammatory medicine. Having fluid drained from the lump with a needle (aspiration). Getting an injection of medicine into the joint to decrease inflammation. This may be corticosteroids, ethanol, or hyaluronidase. Having surgery to remove the ganglion cyst. Placing a pad in your shoe or wearing shoes that will not rub against the cyst if it is on your foot. Follow these instructions at home: Do not press on the ganglion cyst, poke it with a needle, or hit it. Take over-the-counter and prescription medicines only as told by your health care provider. If you have a brace or splint: Wear it as told by your health care provider. Remove it as told by your health care provider. Ask if you need to remove it when you take a shower or a bath. Watch your ganglion cyst for any changes. Keep all follow-up visits as told by your health care provider. This is important. Contact a health care provider if: Your ganglion cyst becomes larger or more painful. You have pus coming from the lump. You have weakness or numbness in the affected  area. You have a fever or chills. Get help right away if: You have a fever and have any of these in the cyst area: Increased redness. Red streaks. Swelling. Summary A ganglion cyst is a non-cancerous, fluid-filled lump that occurs near a joint, tendon, or ligament. Ganglion cysts most often develop in the hand or wrist, but they can also develop in the shoulder, elbow, hip, knee, ankle, or foot. Ganglion cysts often go away on their own without treatment. This information is not intended  to replace advice given to you by your health care provider. Make sure you discuss any questions you have with your health care provider. Document Revised: 08/29/2019 Document Reviewed: 08/29/2019 Elsevier Patient Education  2024 [employer].

## 2023-01-18 NOTE — Assessment & Plan Note (Signed)
Encouraged increased hydration and fiber in diet. Daily probiotics. If bowels not moving can use MOM 2 tbls po in 4 oz of warm prune juice by mouth every 2-3 days. If no results then repeat in 4 hours with  Dulcolax suppository pr, may repeat again in 4 more hours as needed. Seek care if symptoms worsen. Consider daily Miralax and/or Dulcolax if symptoms persist.  

## 2023-01-18 NOTE — Assessment & Plan Note (Signed)
Initially started in June and was painful. No longer painful and not growing. If it becomes painful again will need referral to ortho. She can massage with Voltaren or Lidocaine

## 2023-01-18 NOTE — Assessment & Plan Note (Signed)
Has used Clindamycin lotion in past with good results will refill and add Nystatin to see if that is effective

## 2023-02-02 DIAGNOSIS — F330 Major depressive disorder, recurrent, mild: Secondary | ICD-10-CM | POA: Diagnosis not present

## 2023-02-10 ENCOUNTER — Ambulatory Visit: Payer: BC Managed Care – PPO | Admitting: Podiatry

## 2023-02-17 ENCOUNTER — Other Ambulatory Visit: Payer: Self-pay | Admitting: Family Medicine

## 2023-02-17 DIAGNOSIS — I10 Essential (primary) hypertension: Secondary | ICD-10-CM

## 2023-03-15 ENCOUNTER — Ambulatory Visit: Payer: BC Managed Care – PPO | Admitting: Podiatry

## 2023-03-15 ENCOUNTER — Encounter: Payer: Self-pay | Admitting: Podiatry

## 2023-03-15 DIAGNOSIS — Z79899 Other long term (current) drug therapy: Secondary | ICD-10-CM

## 2023-03-15 DIAGNOSIS — F330 Major depressive disorder, recurrent, mild: Secondary | ICD-10-CM | POA: Diagnosis not present

## 2023-03-16 MED ORDER — ITRACONAZOLE 100 MG PO CAPS: 100.0000 mg | ORAL_CAPSULE | Freq: Two times a day (BID) | ORAL | 0 refills | Status: DC

## 2023-03-16 NOTE — Progress Notes (Signed)
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  It appears that the matricectomy's are doing well she says and I can see that the nails are starting to grow out again.  Objective: Vital signs are stable she is alert and oriented x 3 matrixectomy sites have gone on to heal uneventfully there is no erythema Dem salines drainage odor though she does demonstrate healing of the onychomycosis she is approximately only 50% healed at this point.  Assessment well-healing matrixectomy's onychomycosis resolving.  Still has about 50% to grow out.  Plan: We will get her started back on her itraconazole 100 mg twice a day for the next 3 months I will follow-up with her at that time.

## 2023-03-16 NOTE — Addendum Note (Signed)
Addended by: Kristian Covey on: 03/16/2023 08:07 AM   Modules accepted: Orders

## 2023-03-30 ENCOUNTER — Encounter: Payer: Self-pay | Admitting: Family Medicine

## 2023-04-10 ENCOUNTER — Encounter: Payer: Self-pay | Admitting: Family Medicine

## 2023-04-11 ENCOUNTER — Other Ambulatory Visit: Payer: Self-pay

## 2023-04-11 MED ORDER — WEGOVY 2.4 MG/0.75ML ~~LOC~~ SOAJ: 2.4000 mg | SUBCUTANEOUS | 2 refills | Status: DC

## 2023-04-11 NOTE — Telephone Encounter (Signed)
Refill sent.

## 2023-04-27 ENCOUNTER — Encounter: Payer: Self-pay | Admitting: Family Medicine

## 2023-04-27 DIAGNOSIS — F330 Major depressive disorder, recurrent, mild: Secondary | ICD-10-CM | POA: Diagnosis not present

## 2023-04-28 ENCOUNTER — Other Ambulatory Visit: Payer: Self-pay | Admitting: Family Medicine

## 2023-06-07 DIAGNOSIS — F330 Major depressive disorder, recurrent, mild: Secondary | ICD-10-CM | POA: Diagnosis not present

## 2023-06-30 ENCOUNTER — Ambulatory Visit: Payer: BC Managed Care – PPO | Admitting: Podiatry

## 2023-07-20 DIAGNOSIS — F330 Major depressive disorder, recurrent, mild: Secondary | ICD-10-CM | POA: Diagnosis not present

## 2023-07-26 ENCOUNTER — Ambulatory Visit: Payer: BC Managed Care – PPO | Admitting: Podiatry

## 2023-08-02 ENCOUNTER — Encounter: Payer: BC Managed Care – PPO | Admitting: Physician Assistant

## 2023-08-02 ENCOUNTER — Encounter: Payer: BC Managed Care – PPO | Admitting: Family Medicine

## 2023-08-16 DIAGNOSIS — F330 Major depressive disorder, recurrent, mild: Secondary | ICD-10-CM | POA: Diagnosis not present

## 2023-08-18 ENCOUNTER — Ambulatory Visit (INDEPENDENT_AMBULATORY_CARE_PROVIDER_SITE_OTHER): Payer: BC Managed Care – PPO | Admitting: Physician Assistant

## 2023-08-18 ENCOUNTER — Encounter: Payer: Self-pay | Admitting: Physician Assistant

## 2023-08-18 VITALS — BP 128/85 | HR 62 | Temp 97.7°F | Ht 67.0 in | Wt 188.1 lb

## 2023-08-18 DIAGNOSIS — E785 Hyperlipidemia, unspecified: Secondary | ICD-10-CM | POA: Diagnosis not present

## 2023-08-18 DIAGNOSIS — Z0000 Encounter for general adult medical examination without abnormal findings: Secondary | ICD-10-CM

## 2023-08-18 DIAGNOSIS — I8391 Asymptomatic varicose veins of right lower extremity: Secondary | ICD-10-CM

## 2023-08-18 DIAGNOSIS — Z8639 Personal history of other endocrine, nutritional and metabolic disease: Secondary | ICD-10-CM | POA: Diagnosis not present

## 2023-08-18 DIAGNOSIS — E6609 Other obesity due to excess calories: Secondary | ICD-10-CM

## 2023-08-18 DIAGNOSIS — K219 Gastro-esophageal reflux disease without esophagitis: Secondary | ICD-10-CM | POA: Diagnosis not present

## 2023-08-18 DIAGNOSIS — Z1231 Encounter for screening mammogram for malignant neoplasm of breast: Secondary | ICD-10-CM

## 2023-08-18 DIAGNOSIS — I10 Essential (primary) hypertension: Secondary | ICD-10-CM

## 2023-08-18 LAB — LIPID PANEL
Cholesterol: 164 mg/dL (ref 0–200)
HDL: 49.1 mg/dL (ref >39.00)
LDL Cholesterol: 103 mg/dL — ABNORMAL HIGH (ref 0–99)
NonHDL: 114.92
Total CHOL/HDL Ratio: 3
Triglycerides: 59 mg/dL (ref 0.0–149.0)
VLDL: 11.8 mg/dL (ref 0.0–40.0)

## 2023-08-18 LAB — COMPREHENSIVE METABOLIC PANEL
ALT: 12 U/L (ref 0–35)
AST: 18 U/L (ref 0–37)
Albumin: 3.8 g/dL (ref 3.5–5.2)
Alkaline Phosphatase: 64 U/L (ref 39–117)
BUN: 25 mg/dL — ABNORMAL HIGH (ref 6–23)
CO2: 30 meq/L (ref 19–32)
Calcium: 9.1 mg/dL (ref 8.4–10.5)
Chloride: 103 meq/L (ref 96–112)
Creatinine, Ser: 0.98 mg/dL (ref 0.40–1.20)
GFR: 64.64 mL/min (ref >60.00)
Glucose, Bld: 73 mg/dL (ref 70–99)
Potassium: 4.1 meq/L (ref 3.5–5.1)
Sodium: 139 meq/L (ref 135–145)
Total Bilirubin: 0.6 mg/dL (ref 0.2–1.2)
Total Protein: 7 g/dL (ref 6.0–8.3)

## 2023-08-18 LAB — VITAMIN D 25 HYDROXY (VIT D DEFICIENCY, FRACTURES): VITD: 66.68 ng/mL (ref 30.00–100.00)

## 2023-08-18 MED ORDER — ATENOLOL 50 MG PO TABS
ORAL_TABLET | ORAL | 1 refills | Status: DC
Start: 2023-08-18 — End: 2024-02-13

## 2023-08-18 NOTE — Assessment & Plan Note (Signed)
 Chronic, manages with pepcid 20-40 mg at bedtime.

## 2023-08-18 NOTE — Progress Notes (Signed)
 Complete physical exam   Patient: Kim Fox   DOB: May 30, 1966   58 y.o. Female  MRN: [id number] Visit Date: 08/18/2023  Today's healthcare provider: Alfredia Ferguson, PA-C   Cc. cpe  Subjective    Kim Fox is a 58 y.o. female who presents today for a complete physical exam.   She reports a small bump on the back of her right leg. Does not hurt.  Past Medical History:  Diagnosis Date   Depression    on meds   Dyslipidemia 10/17/2017   GERD (gastroesophageal reflux disease)    Headache(784.0)    menstral migraine   History of chicken pox 04/10/2015   Hypertension    on meds   Menorrhagia    Multiple renal cysts 08/09/2011   Murmur, cardiac    dx as a child-Echo WNL 2009   Obesity    Polycystic kidney disease    Preventative health care 04/20/2015   Right leg DVT (HCC) 2015   on meds for preventative tx 05/19/2021   Situational anxiety 10/17/2017   on meds   Urinary incontinence 10/28/2015   Past Surgical History:  Procedure Laterality Date   ENDOMETRIAL ABLATION  2016   ENDOMETRIAL BIOPSY  2010   KIDNEY CYST REMOVAL  2002   TONSILLECTOMY  1976   URETERAL STENT PLACEMENT  2002   WISDOM TOOTH EXTRACTION  1984   Social History   Socioeconomic History   Marital status: Divorced    Spouse name: Not on file   Number of children: 2   Years of education: college   Highest education level: Some college, no degree  Occupational History   Occupation: Technical brewer: [employer]  Tobacco Use   Smoking status: Never   Smokeless tobacco: Never  Vaping Use   Vaping status: Never Used  Substance and Sexual Activity   Alcohol use: Not Currently    Alcohol/week: 0.0 - 1.0 standard drinks of alcohol    Comment: occ   Drug use: No   Sexual activity: Not on file  Other Topics Concern   Not on file  Social History Narrative   Not on file   Social Drivers of Health   Financial Resource Strain: Low Risk  (08/17/2023)    Overall Financial Resource Strain (CARDIA)    Difficulty of Paying Living Expenses: Not very hard  Food Insecurity: No Food Insecurity (08/17/2023)   Hunger Vital Sign    Worried About Running Out of Food in the Last Year: Never true    Ran Out of Food in the Last Year: Never true  Transportation Needs: No Transportation Needs (08/17/2023)   PRAPARE - Administrator, Civil Service (Medical): No    Lack of Transportation (Non-Medical): No  Physical Activity: Insufficiently Active (08/17/2023)   Exercise Vital Sign    Days of Exercise per Week: 2 days    Minutes of Exercise per Session: 20 min  Stress: No Stress Concern Present (08/17/2023)   [employer] of Occupational Health - Occupational Stress Questionnaire    Feeling of Stress : Only a little  Social Connections: Socially Isolated (08/17/2023)   Social Connection and Isolation Panel [NHANES]    Frequency of Communication with Friends and Family: More than three times a week    Frequency of Social Gatherings with Friends and Family: Once a week    Attends Religious Services: Never    Database administrator or Organizations: No  Attends Banker Meetings: Not on file    Marital Status: Divorced  Intimate Partner Violence: Not on file   Family Status  Relation Name Status   Mother 70 Alive   Father 27 Alive   Sister 69 Alive   Sister 30 Alive   Sister 55 Alive   Sister 66 Alive   Daughter 21 Alive   Son 19 Alive   MGM  Deceased   MGF  Deceased   PGM  Deceased   PGF  Deceased   Neg Hx  (Not Specified)  No partnership data on file   Family History  Problem Relation Age of Onset   Diabetes Mother        insulin dependent   Hypertension Mother    Migraines Mother    Varicose Veins Mother    Arthritis Mother    Heart disease Father    Diabetes Father    Hypertension Father    Kidney disease Father    Arthritis Father    Migraines Sister    Allergies Sister    Renal Cyst Sister     Cancer Sister        skin cancer   Dysfunctional uterine bleeding Sister    Fibromyalgia Sister    Renal Cyst Sister    Hypertension Sister    Dysfunctional uterine bleeding Sister    Hyperlipidemia Daughter    Cancer Paternal Grandmother        lung   Cancer Paternal Grandfather        bladder, prostate   Heart disease Paternal Grandfather    Colon cancer Neg Hx    Esophageal cancer Neg Hx    Stomach cancer Neg Hx    Rectal cancer Neg Hx    Colon polyps Neg Hx    Allergies  Allergen Reactions   Nitrofurantoin Rash   Macrodantin Rash   Penicillins Rash    Patient Care Team: Bradd Canary, MD as PCP - General (Family Medicine)   Medications: Outpatient Medications Prior to Visit  Medication Sig   aspirin EC 81 MG tablet Take 81 mg by mouth 2 (two) times daily.   Biotin 5000 MCG CAPS Take 1 capsule by mouth daily.   Calcium Carbonate-Vitamin D (CALCIUM-VITAMIN D) 600-200 MG-UNIT CAPS Take 1 tablet by mouth 2 (two) times daily.   clindamycin (CLEOCIN T) 1 % lotion Apply topically 2 (two) times daily.   diclofenac Sodium (VOLTAREN) 1 % GEL Apply 4 g topically 4 (four) times daily as needed.   famotidine (PEPCID) 40 MG tablet TAKE 1/2- 1 TABLET BY MOUTH EVERY NIGHT AT BEDTIME AS NEEDED FOR HEARTBURN OR INDIGESTION   folic acid (FOLVITE) 800 MCG tablet Take 400 mcg by mouth daily.   L-Lysine 1000 MG TABS Take 1 tablet by mouth daily.   Lidocaine HCl (ASPERCREME LIDOCAINE) 4 % CREA Apply 1 Dose topically 2 (two) times daily as needed.   Multiple Vitamin (MULTIVITAMIN) tablet Take 1 tablet by mouth daily.   nystatin cream (MYCOSTATIN) Apply 1 Application topically 2 (two) times daily.   Omega 3-6-9 Fatty Acids (OMEGA 3-6-9 COMPLEX PO) Take 2 capsules by mouth daily.   Probiotic Product (ACIDOPHILUS/GOAT MILK PO) Take 1 capsule by mouth daily at 6 (six) AM.   Semaglutide-Weight Management (WEGOVY) 2.4 MG/0.75ML SOAJ Inject 2.4 mg into the skin once a week.   tiZANidine  (ZANAFLEX) 2 MG tablet Take 0.5-2 tablets (1-4 mg total) by mouth 2 (two) times daily as needed for muscle spasms.  vitamin B-12 (CYANOCOBALAMIN) 500 MCG tablet Take 500 mcg by mouth daily.   VITAMIN D PO Take 1 capsule by mouth daily at 6 (six) AM.   [DISCONTINUED] atenolol (TENORMIN) 50 MG tablet TAKE 1 TABLET(50 MG) BY MOUTH DAILY   [DISCONTINUED] itraconazole (SPORANOX) 100 MG capsule Take 1 capsule (100 mg total) by mouth 2 (two) times daily.   No facility-administered medications prior to visit.    Review of Systems  Constitutional:  Negative for fatigue and fever.  Respiratory:  Negative for cough and shortness of breath.   Cardiovascular:  Negative for chest pain and leg swelling.  Gastrointestinal:  Negative for abdominal pain.  Neurological:  Negative for dizziness and headaches.      Objective    BP 128/85   Pulse 62   Temp 97.7 F (36.5 C) (Oral)   Ht 5\' 7"  (1.702 m)   Wt 188 lb 2 oz (85.3 kg)   SpO2 98%   BMI 29.46 kg/m    Physical Exam Constitutional:      General: She is awake.     Appearance: She is well-developed. She is not ill-appearing.  HENT:     Head: Normocephalic.     Right Ear: Tympanic membrane normal.     Left Ear: Tympanic membrane normal.     Nose: Nose normal. No congestion or rhinorrhea.     Mouth/Throat:     Pharynx: No oropharyngeal exudate or posterior oropharyngeal erythema.  Eyes:     Conjunctiva/sclera: Conjunctivae normal.     Pupils: Pupils are equal, round, and reactive to light.  Neck:     Thyroid: No thyroid mass or thyromegaly.  Cardiovascular:     Rate and Rhythm: Normal rate and regular rhythm.     Heart sounds: Normal heart sounds.  Pulmonary:     Effort: Pulmonary effort is normal.     Breath sounds: Normal breath sounds.  Abdominal:     Palpations: Abdomen is soft.     Tenderness: There is no abdominal tenderness.  Musculoskeletal:     Right lower leg: No swelling. No edema.     Left lower leg: No swelling. No  edema.  Lymphadenopathy:     Cervical: No cervical adenopathy.  Skin:    General: Skin is warm.     Comments: Post right calf there is a varicose vein  Neurological:     Mental Status: She is alert and oriented to person, place, and time.  Psychiatric:        Attention and Perception: Attention normal.        Mood and Affect: Mood normal.        Speech: Speech normal.        Behavior: Behavior normal. Behavior is cooperative.      Last depression screening scores    08/18/2023    9:26 AM 01/18/2023    9:15 AM 07/20/2022    1:25 PM  PHQ 2/9 Scores  PHQ - 2 Score 0 0 0  PHQ- 9 Score   0   Last fall risk screening    08/18/2023    9:26 AM  Fall Risk   Falls in the past year? 0  Number falls in past yr: 0  Injury with Fall? 0  Risk for fall due to : No Fall Risks  Follow up Falls evaluation completed   Last Audit-C alcohol use screening    08/17/2023    9:10 AM  Alcohol Use Disorder Test (AUDIT)  1. How often do  you have a drink containing alcohol? 1  2. How many drinks containing alcohol do you have on a typical day when you are drinking? 0  3. How often do you have six or more drinks on one occasion? 0  AUDIT-C Score 1      Patient-reported   A score of 3 or more in women, and 4 or more in men indicates increased risk for alcohol abuse, EXCEPT if all of the points are from question 1   No results found for any visits on 08/18/23.  Assessment & Plan    Routine Health Maintenance and Physical Exam  Exercise Activities and Dietary recommendations  --balanced diet high in fiber and protein, low in sugars, carbs, fats. --physical activity/exercise 20-30 minutes 3-5 times a week    Immunization History  Administered Date(s) Administered   Influenza Split 03/18/2011   Influenza,inj,Quad PF,6+ Mos 04/04/2013, 04/18/2014, 04/10/2015, 04/20/2019, 04/08/2021, 04/12/2022   Influenza-Unspecified 04/02/2017, 04/12/2018, 05/07/2020, 03/29/2023   Moderna Sars-Covid-2  Vaccination 10/01/2019, 11/06/2019   Tdap 08/09/2011, 01/04/2022   Zoster Recombinant(Shingrix) 07/02/2022, 11/19/2022    Health Maintenance  Topic Date Due   COVID-19 Vaccine (3 - Moderna risk series) 12/04/2019   MAMMOGRAM  09/09/2023   Cervical Cancer Screening (HPV/Pap Cotest)  03/17/2024   Colonoscopy  06/02/2028   DTaP/Tdap/Td (3 - Td or Tdap) 01/05/2032   INFLUENZA VACCINE  Completed   Zoster Vaccines- Shingrix  Completed   HPV VACCINES  Aged Out   Hepatitis C Screening  Discontinued   HIV Screening  Discontinued    Discussed health benefits of physical activity, and encouraged her to engage in regular exercise appropriate for her age and condition.  Problem List Items Addressed This Visit       Cardiovascular and Mediastinum   Essential hypertension, benign   Chronic, well controlled Manages with atenolol 50 mg daily. Reviewed cmp F/u 6 mo      Relevant Medications   atenolol (TENORMIN) 50 MG tablet   Other Relevant Orders   Comp Met (CMET)     Digestive   GERD (gastroesophageal reflux disease)   Chronic, manages with pepcid 20-40 mg at bedtime.         Other   Obesity   Wt Readings from Last 3 Encounters:  08/18/23 188 lb 2 oz (85.3 kg)  01/18/23 202 lb 12.8 oz (92 kg)  07/20/22 231 lb (104.8 kg)   Pt currently managed on wegovy 2.4 mg. Briefly discussed various methods of coming off medicine -- every other week, titrating down, staying on a low dose. No changes for now.         Dyslipidemia   Relevant Orders   Lipid panel   Other Visit Diagnoses       Annual physical exam    -  Primary     History of vitamin D deficiency       Relevant Orders   Vitamin D (25 hydroxy)     Varicose veins of right lower extremity, unspecified whether complicated       Relevant Medications   atenolol (TENORMIN) 50 MG tablet     Breast cancer screening by mammogram       Relevant Orders   MM 3D SCREENING MAMMOGRAM BILATERAL BREAST        Return in about  6 months (around 02/15/2024) for chronic conditions.     Alfredia Ferguson, PA-C  [facility] [phone] (phone) [phone] (fax)  [facility]

## 2023-08-18 NOTE — Assessment & Plan Note (Signed)
 Chronic, well controlled Manages with atenolol 50 mg daily. Reviewed cmp F/u 6 mo

## 2023-08-18 NOTE — Assessment & Plan Note (Addendum)
 Wt Readings from Last 3 Encounters:  08/18/23 188 lb 2 oz (85.3 kg)  01/18/23 202 lb 12.8 oz (92 kg)  07/20/22 231 lb (104.8 kg)   Pt currently managed on wegovy 2.4 mg. Briefly discussed various methods of coming off medicine -- every other week, titrating down, staying on a low dose. No changes for now.

## 2023-09-29 ENCOUNTER — Encounter: Payer: Self-pay | Admitting: Family Medicine

## 2023-09-29 ENCOUNTER — Telehealth: Payer: Self-pay | Admitting: Pharmacy Technician

## 2023-09-29 ENCOUNTER — Other Ambulatory Visit (HOSPITAL_COMMUNITY): Payer: Self-pay

## 2023-09-29 NOTE — Telephone Encounter (Signed)
 Pharmacy Patient Advocate Encounter  Insurance verification completed.   The patient is insured through [employer] test claim for [facility] 2.4MG . Currently a quantity of 3 ML is a 28 day supply and the co-pay is $24.99 . The current 28 day co-pay is, $24.99.  No PA needed at this time.  This test claim was processed through [facility]- copay amounts may vary at other pharmacies due to pharmacy/plan contracts, or as the patient moves through the different stages of their insurance plan.

## 2023-09-29 NOTE — Telephone Encounter (Signed)
 New Encounter has been or will be created for follow up. For additional info see Pharmacy Prior Auth telephone encounter from 09/29/23.

## 2023-10-03 ENCOUNTER — Other Ambulatory Visit (HOSPITAL_COMMUNITY): Payer: Self-pay

## 2023-10-03 MED ORDER — WEGOVY 2.4 MG/0.75ML ~~LOC~~ SOAJ: 2.4000 mg | SUBCUTANEOUS | 1 refills | Status: DC

## 2023-10-03 NOTE — Telephone Encounter (Signed)
[facility]

## 2023-10-03 NOTE — Addendum Note (Signed)
 Addended by: Marylou Sobers D on: 10/03/2023 11:50 AM   Modules accepted: Orders

## 2023-10-03 NOTE — Telephone Encounter (Signed)
 Good morning Kim Fox, the script is written for 3 months we just can't run through for 3 months because of the pt's plan but she should be able to fill through [facility] for 3 months if that's her preferred pharmacy

## 2023-10-04 ENCOUNTER — Ambulatory Visit (HOSPITAL_BASED_OUTPATIENT_CLINIC_OR_DEPARTMENT_OTHER)
Admission: RE | Admit: 2023-10-04 | Discharge: 2023-10-04 | Disposition: A | Discharge status: Home / Self Care | Source: Ambulatory Visit | Attending: Physician Assistant | Admitting: Physician Assistant

## 2023-10-04 DIAGNOSIS — Z1231 Encounter for screening mammogram for malignant neoplasm of breast: Secondary | ICD-10-CM | POA: Insufficient documentation

## 2023-10-05 DIAGNOSIS — F330 Major depressive disorder, recurrent, mild: Secondary | ICD-10-CM | POA: Diagnosis not present

## 2023-10-06 ENCOUNTER — Encounter: Payer: Self-pay | Admitting: Physician Assistant

## 2023-11-30 DIAGNOSIS — F330 Major depressive disorder, recurrent, mild: Secondary | ICD-10-CM | POA: Diagnosis not present

## 2023-12-09 ENCOUNTER — Encounter: Payer: Self-pay | Admitting: Family Medicine

## 2024-01-26 DIAGNOSIS — F330 Major depressive disorder, recurrent, mild: Secondary | ICD-10-CM | POA: Diagnosis not present

## 2024-02-11 ENCOUNTER — Other Ambulatory Visit: Payer: Self-pay | Admitting: Physician Assistant

## 2024-02-11 DIAGNOSIS — I10 Essential (primary) hypertension: Secondary | ICD-10-CM

## 2024-02-14 NOTE — Assessment & Plan Note (Signed)
 Well controlled, no changes to meds. Encouraged heart healthy diet such as the DASH diet and exercise as tolerated.

## 2024-02-14 NOTE — Assessment & Plan Note (Signed)
 Encourage heart healthy diet such as MIND or DASH diet, increase exercise, avoid trans fats, simple carbohydrates and processed foods, consider a krill or fish or flaxseed oil cap daily.

## 2024-02-14 NOTE — Assessment & Plan Note (Signed)
 286.8# in February 2023

## 2024-02-14 NOTE — Assessment & Plan Note (Signed)
 [id number] acceptable, minimize simple carbs. Increase exercise as tolerated.

## 2024-02-16 ENCOUNTER — Ambulatory Visit: Payer: BC Managed Care – PPO | Admitting: Family Medicine

## 2024-02-16 ENCOUNTER — Other Ambulatory Visit (HOSPITAL_COMMUNITY): Payer: Self-pay

## 2024-02-16 ENCOUNTER — Encounter: Payer: Self-pay | Admitting: Family Medicine

## 2024-02-16 VITALS — BP 126/84 | HR 63 | Resp 16 | Ht 67.0 in | Wt 190.8 lb

## 2024-02-16 DIAGNOSIS — R739 Hyperglycemia, unspecified: Secondary | ICD-10-CM

## 2024-02-16 DIAGNOSIS — Z8639 Personal history of other endocrine, nutritional and metabolic disease: Secondary | ICD-10-CM | POA: Diagnosis not present

## 2024-02-16 DIAGNOSIS — E785 Hyperlipidemia, unspecified: Secondary | ICD-10-CM

## 2024-02-16 DIAGNOSIS — I10 Essential (primary) hypertension: Secondary | ICD-10-CM | POA: Diagnosis not present

## 2024-02-16 DIAGNOSIS — E6609 Other obesity due to excess calories: Secondary | ICD-10-CM

## 2024-02-16 LAB — LIPID PANEL
Cholesterol: 174 mg/dL (ref 0–200)
HDL: 54.5 mg/dL (ref >39.00)
LDL Cholesterol: 108 mg/dL — ABNORMAL HIGH (ref 0–99)
NonHDL: 119.84
Total CHOL/HDL Ratio: 3
Triglycerides: 58 mg/dL (ref 0.0–149.0)
VLDL: 11.6 mg/dL (ref 0.0–40.0)

## 2024-02-16 LAB — CBC WITH DIFFERENTIAL/PLATELET
Basophils Absolute: 0.1 K/uL (ref 0.0–0.1)
Basophils Relative: 2.2 % (ref 0.0–3.0)
Eosinophils Absolute: 0.2 K/uL (ref 0.0–0.7)
Eosinophils Relative: 2.9 % (ref 0.0–5.0)
HCT: 44.1 % (ref 36.0–46.0)
Hemoglobin: 14.7 g/dL (ref 12.0–15.0)
Lymphocytes Relative: 36.3 % (ref 12.0–46.0)
Lymphs Abs: 2 K/uL (ref 0.7–4.0)
MCHC: 33.2 g/dL (ref 30.0–36.0)
MCV: 89.9 fl (ref 78.0–100.0)
Monocytes Absolute: 0.6 K/uL (ref 0.1–1.0)
Monocytes Relative: 10.6 % (ref 3.0–12.0)
Neutro Abs: 2.6 K/uL (ref 1.4–7.7)
Neutrophils Relative %: 48 % (ref 43.0–77.0)
Platelets: 224 K/uL (ref 150.0–400.0)
RBC: 4.91 Mil/uL (ref 3.87–5.11)
RDW: 12.6 % (ref 11.5–15.5)
WBC: 5.4 K/uL (ref 4.0–10.5)

## 2024-02-16 LAB — VITAMIN D 25 HYDROXY (VIT D DEFICIENCY, FRACTURES): VITD: 85.07 ng/mL (ref 30.00–100.00)

## 2024-02-16 LAB — COMPREHENSIVE METABOLIC PANEL WITH GFR
ALT: 14 U/L (ref 0–35)
AST: 21 U/L (ref 0–37)
Albumin: 3.9 g/dL (ref 3.5–5.2)
Alkaline Phosphatase: 59 U/L (ref 39–117)
BUN: 25 mg/dL — ABNORMAL HIGH (ref 6–23)
CO2: 30 meq/L (ref 19–32)
Calcium: 9.1 mg/dL (ref 8.4–10.5)
Chloride: 103 meq/L (ref 96–112)
Creatinine, Ser: 1.01 mg/dL (ref 0.40–1.20)
GFR: 62.12 mL/min (ref >60.00)
Glucose, Bld: 84 mg/dL (ref 70–99)
Potassium: 4.2 meq/L (ref 3.5–5.1)
Sodium: 140 meq/L (ref 135–145)
Total Bilirubin: 0.6 mg/dL (ref 0.2–1.2)
Total Protein: 7 g/dL (ref 6.0–8.3)

## 2024-02-16 LAB — HEMOGLOBIN A1C: Hgb A1c MFr Bld: 5.5 % (ref 4.6–6.5)

## 2024-02-16 LAB — TSH: TSH: 0.67 u[IU]/mL (ref 0.35–5.50)

## 2024-02-16 MED ORDER — WEGOVY 1 MG/0.5ML ~~LOC~~ SOAJ: 1.0000 mg | SUBCUTANEOUS | 0 refills | Status: DC

## 2024-02-16 MED ORDER — WEGOVY 1.7 MG/0.75ML ~~LOC~~ SOAJ: 1.7000 mg | SUBCUTANEOUS | 0 refills | Status: DC

## 2024-02-16 MED ORDER — WEGOVY 0.5 MG/0.5ML ~~LOC~~ SOAJ: 0.5000 mg | SUBCUTANEOUS | 0 refills | Status: DC

## 2024-02-16 MED ORDER — WEGOVY 0.25 MG/0.5ML ~~LOC~~ SOAJ: 0.2500 mg | SUBCUTANEOUS | 1 refills | Status: DC

## 2024-02-16 NOTE — Progress Notes (Signed)
 Subjective:    Patient ID: Kim Fox, female    DOB: [birth year], 58 y.o.   MRN: [id number]  Chief Complaint  Patient presents with  . Medical Management of Chronic Issues    Patient presents today for a  6 month follow-up.  . Quality Metric Gaps    Pap, Hep B vaccine, pneumococcal vaccine    HPI Discussed the use of AI scribe software for clinical note transcription with the patient, who gave verbal consent to proceed.  History of Present Illness Kim Fox is a 58 year old female who presents for management of Wegovy  discontinuation due to insurance coverage changes.  She has been on Wegovy  for over two years and has lost nearly 100 pounds. Her insurance will no longer cover the medication starting January 26. She has six shots of Wegovy  left. She is concerned about regaining weight if she stops the medication abruptly.  Her son, who is on a different insurance plan, receives his medication without issues and has a surplus of doses. Her son's wife has transitioned from Wegovy  to Mounjaro with zero copay. She is exploring the possibility of using Rybelsus , which her father takes for diabetes management, as an alternative if it becomes necessary.  She has experienced some menopausal symptoms in the past, such as sweating, but currently feels cold most of the time. No hot flashes or other severe menopausal symptoms. She had an ablation in 2016 and has not had a menstrual cycle since then. She is curious about her menopausal status but understands that blood work may not provide clear answers.  She has noticed increased irritability, particularly while driving, which she attributes to hormonal changes or stress. She has been dealing with stressors such as her dog's health issues and her father's recent hospitalization for ulcers, which required her to take caregiver leave during her vacation.  She experiences occasional constipation, which she attributes to her  medication. She manages it with fiber supplements and occasional stool softeners. She ensures adequate hydration and physical activity to help alleviate symptoms.  She has been using Astepro for nasal symptoms that occur during physical activity or exposure to heat. She finds it effective and uses it as needed, particularly when engaging in outdoor activities.    Past Medical History:  Diagnosis Date  . Depression    on meds  . Dyslipidemia 10/17/2017  . GERD (gastroesophageal reflux disease)   . Headache(784.0)    menstral migraine  . History of chicken pox 04/10/2015  . Hypertension    on meds  . Menorrhagia   . Multiple renal cysts 08/09/2011  . Murmur, cardiac    dx as a child-Echo WNL 2009  . Obesity   . Polycystic kidney disease   . Preventative health care 04/20/2015  . Right leg DVT (HCC) 2015   on meds for preventative tx 05/19/2021  . Situational anxiety 10/17/2017   on meds  . Urinary incontinence 10/28/2015    Past Surgical History:  Procedure Laterality Date  . ENDOMETRIAL ABLATION  2016  . ENDOMETRIAL BIOPSY  2010  . KIDNEY CYST REMOVAL  2002  . TONSILLECTOMY  1976  . URETERAL STENT PLACEMENT  2002  . WISDOM TOOTH EXTRACTION  1984    Family History  Problem Relation Age of Onset  . Diabetes Mother        insulin dependent  . Hypertension Mother   . Migraines Mother   . Varicose Veins Mother   . Arthritis Mother   .  Heart disease Father   . Diabetes Father   . Hypertension Father   . Kidney disease Father   . Arthritis Father   . Migraines Sister   . Allergies Sister   . Renal Cyst Sister   . Cancer Sister        skin cancer  . Dysfunctional uterine bleeding Sister   . Fibromyalgia Sister   . Renal Cyst Sister   . Hypertension Sister   . Dysfunctional uterine bleeding Sister   . Hyperlipidemia Daughter   . Cancer Paternal Grandmother        lung  . Cancer Paternal Grandfather        bladder, prostate  . Heart disease Paternal  Grandfather   . Colon cancer Neg Hx   . Esophageal cancer Neg Hx   . Stomach cancer Neg Hx   . Rectal cancer Neg Hx   . Colon polyps Neg Hx     Social History   Socioeconomic History  . Marital status: Divorced    Spouse name: Not on file  . Number of children: 2  . Years of education: college  . Highest education level: Some college, no degree  Occupational History  . Occupation: Technical brewer: [employer]  Tobacco Use  . Smoking status: Never  . Smokeless tobacco: Never  Vaping Use  . Vaping status: Never Used  Substance and Sexual Activity  . Alcohol use: Not Currently    Alcohol/week: 0.0 - 1.0 standard drinks of alcohol    Comment: occ  . Drug use: No  . Sexual activity: Not on file  Other Topics Concern  . Not on file  Social History Narrative  . Not on file   Social Drivers of Health   Financial Resource Strain: Low Risk  (02/12/2024)   Overall Financial Resource Strain (CARDIA)   . Difficulty of Paying Living Expenses: Not hard at all  Food Insecurity: No Food Insecurity (02/12/2024)   Hunger Vital Sign   . Worried About Programme researcher, broadcasting/film/video in the Last Year: Never true   . Ran Out of Food in the Last Year: Never true  Transportation Needs: No Transportation Needs (02/12/2024)   PRAPARE - Transportation   . Lack of Transportation (Medical): No   . Lack of Transportation (Non-Medical): No  Physical Activity: Insufficiently Active (02/12/2024)   Exercise Vital Sign   . Days of Exercise per Week: 3 days   . Minutes of Exercise per Session: 20 min  Stress: No Stress Concern Present (02/12/2024)   [employer] of Occupational Health - Occupational Stress Questionnaire   . Feeling of Stress: Only a little  Social Connections: Moderately Isolated (02/12/2024)   Social Connection and Isolation Panel   . Frequency of Communication with Friends and Family: More than three times a week   . Frequency of Social Gatherings with Friends and  Family: Twice a week   . Attends Religious Services: 1 to 4 times per year   . Active Member of Clubs or Organizations: No   . Attends Banker Meetings: Not on file   . Marital Status: Divorced  Catering manager Violence: Not on file    Outpatient Medications Prior to Visit  Medication Sig Dispense Refill  . aspirin EC 81 MG tablet Take 81 mg by mouth 2 (two) times daily.    . atenolol  (TENORMIN ) 50 MG tablet TAKE 1 TABLET(50 MG) BY MOUTH DAILY 90 tablet 1  . Biotin 5000 MCG  CAPS Take 1 capsule by mouth daily.    . Calcium Carbonate-Vitamin D  (CALCIUM-VITAMIN D ) 600-200 MG-UNIT CAPS Take 1 tablet by mouth 2 (two) times daily.    . diclofenac  Sodium (VOLTAREN ) 1 % GEL Apply 4 g topically 4 (four) times daily as needed.    . famotidine  (PEPCID ) 40 MG tablet TAKE 1/2- 1 TABLET BY MOUTH EVERY NIGHT AT BEDTIME AS NEEDED FOR HEARTBURN OR INDIGESTION 90 tablet 2  . folic acid (FOLVITE) 800 MCG tablet Take 400 mcg by mouth daily.    SABRA L-Lysine 1000 MG TABS Take 1 tablet by mouth daily.    . Lidocaine  HCl (ASPERCREME LIDOCAINE ) 4 % CREA Apply 1 Dose topically 2 (two) times daily as needed. 133 g 11  . Multiple Vitamin (MULTIVITAMIN) tablet Take 1 tablet by mouth daily.    . Omega 3-6-9 Fatty Acids (OMEGA 3-6-9 COMPLEX PO) Take 2 capsules by mouth daily.    . Probiotic Product (ACIDOPHILUS/GOAT MILK PO) Take 1 capsule by mouth daily at 6 (six) AM.    . Semaglutide -Weight Management (WEGOVY ) 2.4 MG/0.75ML SOAJ Inject 2.4 mg into the skin once a week. 9 mL 1  . tiZANidine  (ZANAFLEX ) 2 MG tablet Take 0.5-2 tablets (1-4 mg total) by mouth 2 (two) times daily as needed for muscle spasms. 30 tablet 1  . vitamin B-12 (CYANOCOBALAMIN) 500 MCG tablet Take 500 mcg by mouth daily.    . VITAMIN D  PO Take 1 capsule by mouth daily at 6 (six) AM.    . clindamycin  (CLEOCIN  T) 1 % lotion Apply topically 2 (two) times daily. 60 mL 1  . nystatin  cream (MYCOSTATIN ) Apply 1 Application topically 2 (two)  times daily. 30 g 1   No facility-administered medications prior to visit.    Allergies  Allergen Reactions  . Nitrofurantoin Rash  . Macrodantin Rash  . Penicillins Rash    Review of Systems  Constitutional:  Negative for fever and malaise/fatigue.  HENT:  Positive for congestion.   Eyes:  Negative for blurred vision.  Respiratory:  Negative for shortness of breath.   Cardiovascular:  Negative for chest pain, palpitations and leg swelling.  Gastrointestinal:  Positive for constipation. Negative for abdominal pain, blood in stool and nausea.  Genitourinary:  Negative for dysuria and frequency.  Musculoskeletal:  Negative for falls.  Skin:  Negative for rash.  Neurological:  Negative for dizziness, loss of consciousness and headaches.  Endo/Heme/Allergies:  Negative for environmental allergies.  Psychiatric/Behavioral:  Negative for depression. The patient is not nervous/anxious.        Objective:    Physical Exam Constitutional:      General: She is not in acute distress.    Appearance: Normal appearance. She is well-developed. She is not toxic-appearing.  HENT:     Head: Normocephalic and atraumatic.     Right Ear: External ear normal.     Left Ear: External ear normal.     Nose: Nose normal.  Eyes:     General:        Right eye: No discharge.        Left eye: No discharge.     Conjunctiva/sclera: Conjunctivae normal.  Neck:     Thyroid : No thyromegaly.  Cardiovascular:     Rate and Rhythm: Normal rate and regular rhythm.     Heart sounds: Normal heart sounds. No murmur heard. Pulmonary:     Effort: Pulmonary effort is normal. No respiratory distress.     Breath sounds: Normal breath sounds.  Abdominal:     General: Bowel sounds are normal.     Palpations: Abdomen is soft.     Tenderness: There is no abdominal tenderness. There is no guarding.  Musculoskeletal:        General: Normal range of motion.     Cervical back: Neck supple.  Lymphadenopathy:      Cervical: No cervical adenopathy.  Skin:    General: Skin is warm and dry.  Neurological:     Mental Status: She is alert and oriented to person, place, and time.  Psychiatric:        Mood and Affect: Mood normal.        Behavior: Behavior normal.        Thought Content: Thought content normal.        Judgment: Judgment normal.     BP 126/84   Pulse 63   Resp 16   Ht 5' 7 (1.702 m)   Wt 190 lb 12.8 oz (86.5 kg)   SpO2 98%   BMI 29.88 kg/m  Wt Readings from Last 3 Encounters:  02/16/24 190 lb 12.8 oz (86.5 kg)  08/18/23 188 lb 2 oz (85.3 kg)  01/18/23 202 lb 12.8 oz (92 kg)    Diabetic Foot Exam - Simple   No data filed    Lab Results  Component Value Date   WBC 5.9 01/18/2023   HGB 14.7 01/18/2023   HCT 45.3 01/18/2023   PLT 233.0 01/18/2023   GLUCOSE 73 08/18/2023   CHOL 164 08/18/2023   TRIG 59.0 08/18/2023   HDL 49.10 08/18/2023   LDLDIRECT 117.0 07/27/2018   LDLCALC 103 (H) 08/18/2023   ALT 12 08/18/2023   AST 18 08/18/2023   NA 139 08/18/2023   K 4.1 08/18/2023   CL 103 08/18/2023   CREATININE 0.98 08/18/2023   BUN 25 (H) 08/18/2023   CO2 30 08/18/2023   TSH 0.70 01/18/2023   INR 1.03 03/25/2014   HGBA1C 5.6 01/18/2023    Lab Results  Component Value Date   TSH 0.70 01/18/2023   Lab Results  Component Value Date   WBC 5.9 01/18/2023   HGB 14.7 01/18/2023   HCT 45.3 01/18/2023   MCV 89.3 01/18/2023   PLT 233.0 01/18/2023   Lab Results  Component Value Date   NA 139 08/18/2023   K 4.1 08/18/2023   CHLORIDE 108 10/07/2016   CO2 30 08/18/2023   GLUCOSE 73 08/18/2023   BUN 25 (H) 08/18/2023   CREATININE 0.98 08/18/2023   BILITOT 0.6 08/18/2023   ALKPHOS 64 08/18/2023   AST 18 08/18/2023   ALT 12 08/18/2023   PROT 7.0 08/18/2023   ALBUMIN 3.8 08/18/2023   CALCIUM 9.1 08/18/2023   ANIONGAP 8 10/07/2016   EGFR 80 (L) 10/07/2016   GFR 64.64 08/18/2023   Lab Results  Component Value Date   CHOL 164 08/18/2023   Lab Results   Component Value Date   HDL 49.10 08/18/2023   Lab Results  Component Value Date   LDLCALC 103 (H) 08/18/2023   Lab Results  Component Value Date   TRIG 59.0 08/18/2023   Lab Results  Component Value Date   CHOLHDL 3 08/18/2023   Lab Results  Component Value Date   HGBA1C 5.6 01/18/2023       Assessment & Plan:  Dyslipidemia Assessment & Plan: Encourage heart healthy diet such as MIND or DASH diet, increase exercise, avoid trans fats, simple carbohydrates and processed foods, consider a krill or fish  or flaxseed oil cap daily.    Orders: -     Lipid panel -     TSH  Essential hypertension, benign Assessment & Plan: Well controlled, no changes to meds. Encouraged heart healthy diet such as the DASH diet and exercise as tolerated.    Orders: -     Comprehensive metabolic panel with GFR -     CBC with Differential/Platelet  Hyperglycemia Assessment & Plan: hgba1c acceptable, minimize simple carbs. Increase exercise as tolerated.   Orders: -     Hemoglobin A1c  Obesity due to excess calories, unspecified class, unspecified whether serious comorbidity present Assessment & Plan: 286.8# in February 2023    History of vitamin D  deficiency -     VITAMIN D  25 Hydroxy (Vit-D Deficiency, Fractures)  Other orders -     Wegovy ; Inject 0.5 mg into the skin once a week.  Dispense: 2 mL; Refill: 0 -     Wegovy ; Inject 0.25 mg into the skin once a week.  Dispense: 2 mL; Refill: 1 -     Wegovy ; Inject 1 mg into the skin once a week.  Dispense: 2 mL; Refill: 0 -     Wegovy ; Inject 1.7 mg into the skin once a week.  Dispense: 3 mL; Refill: 0    Assessment and Plan Assessment & Plan Obesity due to excess calories She has been on Wegovy  for over two years, resulting in a weight loss of nearly 100 pounds. The medication has been effective in managing her weight, but insurance coverage will cease in January 2026. - Titrate Wegovy  dosage: 1.7 mg for September, 1.0 mg for  October, 0.5 mg for November, 0.25 mg for December and January. - Consider Rybelsus  as an alternative, noting its reduced efficacy and increased gastrointestinal side effects. - Develop a weight maintenance strategy using Weight Watchers or cognitive behavioral apps like Noom. - Monitor for reputable compounded options for Wegovy  in January.  Constipation Intermittent constipation likely related to medication use, with occasional hard stools managed by stool softeners. - Ensure intake of at least 80 ounces of water daily. - Encourage at least 4,000 steps of physical activity daily. - Continue fiber supplementation. - Use stool softeners as needed.  Vasomotor rhinitis Experiences nasal drainage with exertion and heat, managed effectively with Astepro nasal spray. - Continue Astepro nasal spray as needed, especially before activities that may trigger symptoms. - Consider using nasal saline rinse after outdoor activities to reduce pollen and dust exposure.  Recording duration: 27 minutes     Harlene Horton, MD

## 2024-02-16 NOTE — Patient Instructions (Signed)
 Hypertension, Adult High blood pressure (hypertension) is when the force of blood pumping through the arteries is too strong. The arteries are the blood vessels that carry blood from the heart throughout the body. Hypertension forces the heart to work harder to pump blood and may cause arteries to become narrow or stiff. Untreated or uncontrolled hypertension can lead to a heart attack, heart failure, a stroke, kidney disease, and other problems. A blood pressure reading consists of a higher number over a lower number. Ideally, your blood pressure should be below 120/80. The first ("top") number is called the systolic pressure. It is a measure of the pressure in your arteries as your heart beats. The second ("bottom") number is called the diastolic pressure. It is a measure of the pressure in your arteries as the heart relaxes. What are the causes? The exact cause of this condition is not known. There are some conditions that result in high blood pressure. What increases the risk? Certain factors may make you more likely to develop high blood pressure. Some of these risk factors are under your control, including: Smoking. Not getting enough exercise or physical activity. Being overweight. Having too much fat, sugar, calories, or salt (sodium) in your diet. Drinking too much alcohol. Other risk factors include: Having a personal history of heart disease, diabetes, high cholesterol, or kidney disease. Stress. Having a family history of high blood pressure and high cholesterol. Having obstructive sleep apnea. Age. The risk increases with age. What are the signs or symptoms? High blood pressure may not cause symptoms. Very high blood pressure (hypertensive crisis) may cause: Headache. Fast or irregular heartbeats (palpitations). Shortness of breath. Nosebleed. Nausea and vomiting. Vision changes. Severe chest pain, dizziness, and seizures. How is this diagnosed? This condition is diagnosed by  measuring your blood pressure while you are seated, with your arm resting on a flat surface, your legs uncrossed, and your feet flat on the floor. The cuff of the blood pressure monitor will be placed directly against the skin of your upper arm at the level of your heart. Blood pressure should be measured at least twice using the same arm. Certain conditions can cause a difference in blood pressure between your right and left arms. If you have a high blood pressure reading during one visit or you have normal blood pressure with other risk factors, you may be asked to: Return on a different day to have your blood pressure checked again. Monitor your blood pressure at home for 1 week or longer. If you are diagnosed with hypertension, you may have other blood or imaging tests to help your health care provider understand your overall risk for other conditions. How is this treated? This condition is treated by making healthy lifestyle changes, such as eating healthy foods, exercising more, and reducing your alcohol intake. You may be referred for counseling on a healthy diet and physical activity. Your health care provider may prescribe medicine if lifestyle changes are not enough to get your blood pressure under control and if: Your systolic blood pressure is above 130. Your diastolic blood pressure is above 80. Your personal target blood pressure may vary depending on your medical conditions, your age, and other factors. Follow these instructions at home: Eating and drinking  Eat a diet that is high in fiber and potassium, and low in sodium, added sugar, and fat. An example of this eating plan is called the DASH diet. DASH stands for Dietary Approaches to Stop Hypertension. To eat this way: Eat  plenty of fresh fruits and vegetables. Try to fill one half of your plate at each meal with fruits and vegetables. Eat whole grains, such as whole-wheat pasta, brown rice, or whole-grain bread. Fill about one  fourth of your plate with whole grains. Eat or drink low-fat dairy products, such as skim milk or low-fat yogurt. Avoid fatty cuts of meat, processed or cured meats, and poultry with skin. Fill about one fourth of your plate with lean proteins, such as fish, chicken without skin, beans, eggs, or tofu. Avoid pre-made and processed foods. These tend to be higher in sodium, added sugar, and fat. Reduce your daily sodium intake. Many people with hypertension should eat less than 1,500 mg of sodium a day. Do not drink alcohol if: Your health care provider tells you not to drink. You are pregnant, may be pregnant, or are planning to become pregnant. If you drink alcohol: Limit how much you have to: 0-1 drink a day for women. 0-2 drinks a day for men. Know how much alcohol is in your drink. In the [place] one drink equals one 12 oz bottle of beer (355 mL), one 5 oz glass of wine (148 mL), or one 1 oz glass of hard liquor (44 mL). Lifestyle  Work with your health care provider to maintain a healthy body weight or to lose weight. Ask what an ideal weight is for you. Get at least 30 minutes of exercise that causes your heart to beat faster (aerobic exercise) most days of the week. Activities may include walking, swimming, or biking. Include exercise to strengthen your muscles (resistance exercise), such as Pilates or lifting weights, as part of your weekly exercise routine. Try to do these types of exercises for 30 minutes at least 3 days a week. Do not use any products that contain nicotine or tobacco. These products include cigarettes, chewing tobacco, and vaping devices, such as e-cigarettes. If you need help quitting, ask your health care provider. Monitor your blood pressure at home as told by your health care provider. Keep all follow-up visits. This is important. Medicines Take over-the-counter and prescription medicines only as told by your health care provider. Follow directions carefully. Blood  pressure medicines must be taken as prescribed. Do not skip doses of blood pressure medicine. Doing this puts you at risk for problems and can make the medicine less effective. Ask your health care provider about side effects or reactions to medicines that you should watch for. Contact a health care provider if you: Think you are having a reaction to a medicine you are taking. Have headaches that keep coming back (recurring). Feel dizzy. Have swelling in your ankles. Have trouble with your vision. Get help right away if you: Develop a severe headache or confusion. Have unusual weakness or numbness. Feel faint. Have severe pain in your chest or abdomen. Vomit repeatedly. Have trouble breathing. These symptoms may be an emergency. Get help right away. Call 911. Do not wait to see if the symptoms will go away. Do not drive yourself to the hospital. Summary Hypertension is when the force of blood pumping through your arteries is too strong. If this condition is not controlled, it may put you at risk for serious complications. Your personal target blood pressure may vary depending on your medical conditions, your age, and other factors. For most people, a normal blood pressure is less than 120/80. Hypertension is treated with lifestyle changes, medicines, or a combination of both. Lifestyle changes include losing weight, eating a healthy,  low-sodium diet, exercising more, and limiting alcohol. This information is not intended to replace advice given to you by your health care provider. Make sure you discuss any questions you have with your health care provider. Document Revised: 04/14/2021 Document Reviewed: 04/14/2021 Elsevier Patient Education  2024 [employer].

## 2024-02-17 ENCOUNTER — Encounter: Payer: Self-pay | Admitting: Family Medicine

## 2024-02-17 ENCOUNTER — Ambulatory Visit: Payer: Self-pay | Admitting: Family Medicine

## 2024-02-22 ENCOUNTER — Other Ambulatory Visit (HOSPITAL_COMMUNITY): Payer: Self-pay

## 2024-02-23 ENCOUNTER — Other Ambulatory Visit (HOSPITAL_COMMUNITY): Payer: Self-pay

## 2024-02-23 ENCOUNTER — Telehealth: Payer: Self-pay

## 2024-02-23 NOTE — Telephone Encounter (Signed)
 Pharmacy Patient Advocate Encounter  Received notification from [facility] that Prior Authorization for Wegovy  1.7mg /0.10ml has been APPROVED from 01/24/24 to 02/22/25   PA #/Case ID/Reference #: [id number]  Case ID: [id number]

## 2024-02-23 NOTE — Telephone Encounter (Signed)
 Pharmacy Patient Advocate Encounter   Received notification from Patient Advice Request messages that prior authorization for Wegovy  1.7mg /0.43ml is required/requested.   Insurance verification completed.   The patient is insured through [employer] .   Per test claim: PA required; PA submitted to above mentioned insurance via Latent Key/confirmation #/EOC [id number] Status is pending

## 2024-03-14 DIAGNOSIS — F330 Major depressive disorder, recurrent, mild: Secondary | ICD-10-CM | POA: Diagnosis not present

## 2024-04-11 ENCOUNTER — Encounter: Payer: Self-pay | Admitting: Family Medicine

## 2024-04-11 DIAGNOSIS — F330 Major depressive disorder, recurrent, mild: Secondary | ICD-10-CM | POA: Diagnosis not present

## 2024-05-04 ENCOUNTER — Encounter: Payer: Self-pay | Admitting: Family Medicine

## 2024-05-08 DIAGNOSIS — F330 Major depressive disorder, recurrent, mild: Secondary | ICD-10-CM | POA: Diagnosis not present

## 2024-06-11 ENCOUNTER — Other Ambulatory Visit: Payer: Self-pay | Admitting: Family Medicine

## 2024-06-11 NOTE — Telephone Encounter (Signed)
 Copied from CRM #[id number]. Topic: Clinical - Medication Refill >> Jun 11, 2024  9:49 AM Burnard DEL wrote: Medication: semaglutide -weight management (WEGOVY ) 1.7 MG/0.75ML SOAJ SQ injection  Has the patient contacted their pharmacy? No (Agent: If no, request that the patient contact the pharmacy for the refill. If patient does not wish to contact the pharmacy document the reason why and proceed with request.) (Agent: If yes, when and what did the pharmacy advise?)  This is the patient's preferred pharmacy:  [id number] - [place], [place] - [address] [place] [address] Phone: [phone] Fax: [phone]    Is this the correct pharmacy for this prescription? Yes If no, delete pharmacy and type the correct one.   Has the prescription been filled recently? No  Is the patient out of the medication? No(1 left)  Has the patient been seen for an appointment in the last year OR does the patient have an upcoming appointment? Yes  Can we respond through MyChart? Yes  Agent: Please be advised that Rx refills may take up to 3 business days. We ask that you follow-up with your pharmacy.

## 2024-06-19 ENCOUNTER — Other Ambulatory Visit: Payer: Self-pay | Admitting: Family Medicine

## 2024-07-12 ENCOUNTER — Telehealth: Payer: Self-pay | Admitting: Family Medicine

## 2024-07-12 NOTE — Telephone Encounter (Signed)
 Copied from CRM #[id number]. Topic: Appointments - Scheduling Inquiry for Clinic >> Jul 12, 2024  3:45 PM Deaijah H wrote: Reason for CRM: Patient would like to know if Dr. Frann would accept her as a new patient and would like to also know if he does accept her if she need to keep the physical with yacopino or not

## 2024-07-17 NOTE — Telephone Encounter (Signed)
 Yes, can change CPE to me. Thx.

## 2024-07-17 NOTE — Telephone Encounter (Signed)
 Pt said a pap smear will be needed & is keeping the physical with JY.

## 2024-09-13 ENCOUNTER — Encounter: Admitting: Student

## 2024-09-13 ENCOUNTER — Encounter: Admitting: Family Medicine
